# Patient Record
Sex: Male | Born: 1955 | Race: Black or African American | Hispanic: No | Smoking: Current every day smoker
Health system: Southern US, Community
[De-identification: ages and names within clinical notes are randomized; demographics above are authoritative.]

## PROBLEM LIST (undated history)

## (undated) DIAGNOSIS — I639 Cerebral infarction, unspecified: Secondary | ICD-10-CM

## (undated) DIAGNOSIS — N492 Inflammatory disorders of scrotum: Secondary | ICD-10-CM

## (undated) DIAGNOSIS — I10 Essential (primary) hypertension: Secondary | ICD-10-CM

## (undated) DIAGNOSIS — Q279 Congenital malformation of peripheral vascular system, unspecified: Secondary | ICD-10-CM

## (undated) DIAGNOSIS — F1010 Alcohol abuse, uncomplicated: Secondary | ICD-10-CM

## (undated) DIAGNOSIS — M1990 Unspecified osteoarthritis, unspecified site: Secondary | ICD-10-CM

## (undated) HISTORY — DX: Congenital malformation of peripheral vascular system, unspecified: Q27.9

## (undated) HISTORY — DX: Inflammatory disorders of scrotum: N49.2

---

## 1990-01-08 HISTORY — PX: NECK SURGERY: SHX720

## 2007-01-08 ENCOUNTER — Emergency Department (HOSPITAL_COMMUNITY): Admission: EM | Admit: 2007-01-08 | Discharge: 2007-01-08 | Payer: Self-pay | Admitting: Family Medicine

## 2008-09-05 ENCOUNTER — Emergency Department (HOSPITAL_COMMUNITY): Admission: EM | Admit: 2008-09-05 | Discharge: 2008-09-05 | Payer: Self-pay | Admitting: Emergency Medicine

## 2010-04-15 LAB — DIFFERENTIAL
Eosinophils Absolute: 0.1 10*3/uL (ref 0.0–0.7)
Lymphocytes Relative: 33 % (ref 12–46)
Lymphs Abs: 1.9 10*3/uL (ref 0.7–4.0)
Neutro Abs: 3.2 10*3/uL (ref 1.7–7.7)
Neutrophils Relative %: 57 % (ref 43–77)

## 2010-04-15 LAB — COMPREHENSIVE METABOLIC PANEL
ALT: 19 U/L (ref 0–53)
BUN: 9 mg/dL (ref 6–23)
CO2: 25 mEq/L (ref 19–32)
Calcium: 9 mg/dL (ref 8.4–10.5)
Creatinine, Ser: 0.94 mg/dL (ref 0.4–1.5)
GFR calc non Af Amer: >60 mL/min (ref >60)
Glucose, Bld: 138 mg/dL — ABNORMAL HIGH (ref 70–99)

## 2010-04-15 LAB — LIPASE, BLOOD: Lipase: 105 U/L — ABNORMAL HIGH (ref 11–59)

## 2010-04-15 LAB — CBC
HCT: 47.7 % (ref 39.0–52.0)
Hemoglobin: 16.4 g/dL (ref 13.0–17.0)
MCHC: 34.3 g/dL (ref 30.0–36.0)
MCV: 97.8 fL (ref 78.0–100.0)
RBC: 4.88 MIL/uL (ref 4.22–5.81)

## 2011-04-26 ENCOUNTER — Encounter (HOSPITAL_COMMUNITY): Payer: Self-pay | Admitting: Emergency Medicine

## 2011-04-26 ENCOUNTER — Emergency Department (INDEPENDENT_AMBULATORY_CARE_PROVIDER_SITE_OTHER)
Admission: EM | Admit: 2011-04-26 | Discharge: 2011-04-26 | Disposition: A | Discharge status: Home / Self Care | Payer: 59 | Source: Home / Self Care | Attending: Emergency Medicine | Admitting: Emergency Medicine

## 2011-04-26 DIAGNOSIS — I10 Essential (primary) hypertension: Secondary | ICD-10-CM

## 2011-04-26 DIAGNOSIS — J329 Chronic sinusitis, unspecified: Secondary | ICD-10-CM

## 2011-04-26 HISTORY — DX: Essential (primary) hypertension: I10

## 2011-04-26 MED ORDER — PREDNISONE 20 MG PO TABS: 40.0000 mg | ORAL_TABLET | Freq: Every day | ORAL | Status: AC

## 2011-04-26 MED ORDER — HYDROCHLOROTHIAZIDE 25 MG PO TABS: 25.0000 mg | ORAL_TABLET | Freq: Every day | ORAL | Status: DC

## 2011-04-26 MED ORDER — FEXOFENADINE HCL 60 MG PO TABS: 60.0000 mg | ORAL_TABLET | Freq: Every day | ORAL | Status: DC

## 2011-04-26 NOTE — ED Provider Notes (Signed)
History     CSN: [id number]  Arrival date & time 04/26/11  0865   First MD Initiated Contact with Patient 04/26/11 1000      Chief Complaint  Patient presents with  . Sinusitis  . URI    (Consider location/radiation/quality/duration/timing/severity/associated sxs/prior treatment) HPI Comments: Patient presents with 5 days of nasal congestion, runny nose, irritated itchy eyes. Has pressure in nasal area. He woke up with these symptoms acutely after doing yard work and mowing grass the night prior to onset. Symptoms are stable, causing trouble sleeping. Has tried benadryl and mucinex without much improvement. He denies fever, facial pain, productive cough, dyspnea, wheezing, chest pain.   He does smoke <0.5ppd.  He has known HTN diagnosed in ED and found on health screen again recently. He has taken medication for this previously. He has an appointment to establish with new PCP at [facility] on 05/07/11 to address this issue.   In addition given hypertension have discussed in as her symptoms such as, headaches, visual changes, numbness and tingling or sudden weakness in any extremity along with facial changes or speech problems patient denied any other symptoms currently or in the past appear  Patient is a 56 y.o. male presenting with sinusitis and URI. The history is provided by the patient.  Sinusitis  Pertinent negatives include no chills, no ear pain, no cough and no shortness of breath.  URI Primary symptoms do not include fever, headaches, ear pain or cough.  The illness is not associated with chills.    Past Medical History  Diagnosis Date  . Hypertension     Past Surgical History  Procedure Date  . Neck surgery     History reviewed. No pertinent family history.  History  Substance Use Topics  . Smoking status: Current Everyday Smoker  . Smokeless tobacco: Not on file  . Alcohol Use: Yes      Review of Systems  Constitutional: Negative for fever and chills.    HENT: Negative for ear pain and neck pain.   Eyes: Negative for photophobia and pain.  Respiratory: Negative for cough and shortness of breath.   Cardiovascular: Negative for chest pain, palpitations and leg swelling.  Neurological: Negative for dizziness, syncope, speech difficulty, light-headedness and headaches.  Psychiatric/Behavioral: Negative for confusion.    Allergies  Penicillins  Home Medications   Current Outpatient Rx  Name Route Sig Dispense Refill  . HYDROCHLOROTHIAZIDE 12.5 MG PO CAPS Oral Take 12.5 mg by mouth daily. HASN'T TAKEN FOR MNTHS    . FEXOFENADINE HCL 60 MG PO TABS Oral Take 1 tablet (60 mg total) by mouth daily. 15 tablet 0  . HYDROCHLOROTHIAZIDE 25 MG PO TABS Oral Take 1 tablet (25 mg total) by mouth daily. 30 tablet 0  . PREDNISONE 20 MG PO TABS Oral Take 2 tablets (40 mg total) by mouth daily. 2 tablets daily for 5 days 10 tablet 0    BP 184/121  Pulse 110  Temp(Src) 98.2 F (36.8 C) (Oral)  Resp 16  SpO2 99%  Physical Exam  Constitutional: He is oriented to person, place, and time. He appears well-developed and well-nourished.       Speech is nasal with congestion. Some mouth-breathing. No stridor.  HENT:  Head: Normocephalic and atraumatic.       Pharyngeal injection. Nasal mucosa irritated, edematous throughout. No tenderness in frontal or maxillary sinuses.  Eyes: EOM are normal. Pupils are equal, round, and reactive to light.       Sclera injected.  Neck: Normal range of motion. Neck supple.  Cardiovascular: Regular rhythm, normal heart sounds and intact distal pulses.  Tachycardia present.  Exam reveals no gallop and no distant heart sounds.   No murmur heard.      Patient was noted initially on initial vital signs to be tachycardia on exam patient remained anywhere from 95-105. Regular rhythm.  Pulmonary/Chest: Effort normal and breath sounds normal. No respiratory distress. He has no wheezes. He has no rales. He exhibits no tenderness.   Musculoskeletal: He exhibits no edema and no tenderness.  Lymphadenopathy:    He has no cervical adenopathy.  Neurological: He is alert and oriented to person, place, and time. He exhibits normal muscle tone. Coordination normal.  Skin: No rash noted.  Psychiatric: He has a normal mood and affect.    ED Course  Procedures (including critical care time)  Labs Reviewed - No data to display No results found.   1. Sinusitis   2. Hypertension       MDM  Allergic rhino-sinusitis vs early infectious sinusitis. Recommend daily antihistamine and short course steroid. Discussed avoidance of decongestants that would elevate BP. Nasal saline prn.   Hypertension: patient is asymptomatic and likely chronically elevated according to history. He has an appointment with new PCP to establish care in 10 days. Will start low dose HCTZ and patient to f/u with new provider for labs and recheck.         Jimmie Molly, MD 04/26/11 1256

## 2011-04-26 NOTE — Discharge Instructions (Signed)
Today we discuss several things among them complications or associated with untreated high blood pressure. You have a followup appointment scheduled in a primary care doctor please followup and consider writing down your blood pressure for further documentation and more information for your Dr. as to determine whether this medicine will be sufficient or not her father modifications to take place. We also discuss symptoms that should require immediate or emergent attention in the emergency department  Arterial Hypertension Arterial hypertension (high blood pressure) is a condition of elevated pressure in your blood vessels. Hypertension over a long period of time is a risk factor for strokes, heart attacks, and heart failure. It is also the leading cause of kidney (renal) failure.  CAUSES   In Adults -- Over 90% of all hypertension has no known cause. This is called essential or primary hypertension. In the other 10% of people with hypertension, the increase in blood pressure is caused by another disorder. This is called secondary hypertension. Important causes of secondary hypertension are:   Heavy alcohol use.   Obstructive sleep apnea.   Hyperaldosterosim (Conn's syndrome).   Steroid use.   Chronic kidney failure.   Hyperparathyroidism.   Medications.   Renal artery stenosis.   Pheochromocytoma.   Cushing's disease.   Coarctation of the aorta.   Scleroderma renal crisis.   Licorice (in excessive amounts).   Drugs (cocaine, methamphetamine).  Your caregiver can explain any items above that apply to you.  In Children -- Secondary hypertension is more common and should always be considered.   Pregnancy -- Few women of childbearing age have high blood pressure. However, up to 10% of them develop hypertension of pregnancy. Generally, this will not harm the woman. It may be a sign of 3 complications of pregnancy: preeclampsia, HELLP syndrome, and eclampsia. Follow up and  control with medication is necessary.  SYMPTOMS   This condition normally does not produce any noticeable symptoms. It is usually found during a routine exam.   Malignant hypertension is a late problem of high blood pressure. It may have the following symptoms:   Headaches.   Blurred vision.   End-organ damage (this means your kidneys, heart, lungs, and other organs are being damaged).   Stressful situations can increase the blood pressure. If a person with normal blood pressure has their blood pressure go up while being seen by their caregiver, this is often termed "white coat hypertension." Its importance is not known. It may be related with eventually developing hypertension or complications of hypertension.   Hypertension is often confused with mental tension, stress, and anxiety.  DIAGNOSIS  The diagnosis is made by 3 separate blood pressure measurements. They are taken at least 1 week apart from each other. If there is organ damage from hypertension, the diagnosis may be made without repeat measurements. Hypertension is usually identified by having blood pressure readings:  Above 140/90 mmHg measured in both arms, at 3 separate times, over a couple weeks.   Over 130/80 mmHg should be considered a risk factor and may require treatment in patients with diabetes.  Blood pressure readings over 120/80 mmHg are called "pre-hypertension" even in non-diabetic patients. To get a true blood pressure measurement, use the following guidelines. Be aware of the factors that can alter blood pressure readings.  Take measurements at least 1 hour after caffeine.   Take measurements 30 minutes after smoking and without any stress. This is another reason to quit smoking - it raises your blood pressure.  Use a proper cuff size. Ask your caregiver if you are not sure about your cuff size.   Most home blood pressure cuffs are automatic. They will measure systolic and diastolic pressures. The systolic  pressure is the pressure reading at the start of sounds. Diastolic pressure is the pressure at which the sounds disappear. If you are elderly, measure pressures in multiple postures. Try sitting, lying or standing.   Sit at rest for a minimum of 5 minutes before taking measurements.   You should not be on any medications like decongestants. These are found in many cold medications.   Record your blood pressure readings and review them with your caregiver.  If you have hypertension:  Your caregiver may do tests to be sure you do not have secondary hypertension (see "causes" above).   Your caregiver may also look for signs of metabolic syndrome. This is also called Syndrome X or Insulin Resistance Syndrome. You may have this syndrome if you have type 2 diabetes, abdominal obesity, and abnormal blood lipids in addition to hypertension.   Your caregiver will take your medical and family history and perform a physical exam.   Diagnostic tests may include blood tests (for glucose, cholesterol, potassium, and kidney function), a urinalysis, or an EKG. Other tests may also be necessary depending on your condition.  PREVENTION  There are important lifestyle issues that you can adopt to reduce your chance of developing hypertension:  Maintain a normal weight.   Limit the amount of salt (sodium) in your diet.   Exercise often.   Limit alcohol intake.   Get enough potassium in your diet. Discuss specific advice with your caregiver.   Follow a DASH diet (dietary approaches to stop hypertension). This diet is rich in fruits, vegetables, and low-fat dairy products, and avoids certain fats.  PROGNOSIS  Essential hypertension cannot be cured. Lifestyle changes and medical treatment can lower blood pressure and reduce complications. The prognosis of secondary hypertension depends on the underlying cause. Many people whose hypertension is controlled with medicine or lifestyle changes can live a normal,  healthy life.  RISKS AND COMPLICATIONS  While high blood pressure alone is not an illness, it often requires treatment due to its short- and long-term effects on many organs. Hypertension increases your risk for:  CVAs or strokes (cerebrovascular accident).   Heart failure due to chronically high blood pressure (hypertensive cardiomyopathy).   Heart attack (myocardial infarction).   Damage to the retina (hypertensive retinopathy).   Kidney failure (hypertensive nephropathy).  Your caregiver can explain list items above that apply to you. Treatment of hypertension can significantly reduce the risk of complications. TREATMENT   For overweight patients, weight loss and regular exercise are recommended. Physical fitness lowers blood pressure.   Mild hypertension is usually treated with diet and exercise. A diet rich in fruits and vegetables, fat-free dairy products, and foods low in fat and salt (sodium) can help lower blood pressure. Decreasing salt intake decreases blood pressure in a 1/3 of people.   Stop smoking if you are a smoker.  The steps above are highly effective in reducing blood pressure. While these actions are easy to suggest, they are difficult to achieve. Most patients with moderate or severe hypertension end up requiring medications to bring their blood pressure down to a normal level. There are several classes of medications for treatment. Blood pressure pills (antihypertensives) will lower blood pressure by their different actions. Lowering the blood pressure by 10 mmHg may decrease the risk of  complications by as much as 25%. The goal of treatment is effective blood pressure control. This will reduce your risk for complications. Your caregiver will help you determine the best treatment for you according to your lifestyle. What is excellent treatment for one person, may not be for you. HOME CARE INSTRUCTIONS   Do not smoke.   Follow the lifestyle changes outlined in the  "Prevention" section.   If you are on medications, follow the directions carefully. Blood pressure medications must be taken as prescribed. Skipping doses reduces their benefit. It also puts you at risk for problems.   Follow up with your caregiver, as directed.   If you are asked to monitor your blood pressure at home, follow the guidelines in the "Diagnosis" section above.  SEEK MEDICAL CARE IF:   You think you are having medication side effects.   You have recurrent headaches or lightheadedness.   You have swelling in your ankles.   You have trouble with your vision.  SEEK IMMEDIATE MEDICAL CARE IF:   You have sudden onset of chest pain or pressure, difficulty breathing, or other symptoms of a heart attack.   You have a severe headache.   You have symptoms of a stroke (such as sudden weakness, difficulty speaking, difficulty walking).  MAKE SURE YOU:   Understand these instructions.   Will watch your condition.   Will get help right away if you are not doing well or get worse.  Document Released: 12/25/2004 Document Revised: 12/14/2010 Document Reviewed: 07/25/2006 [facility] Patient Information 2012 [place], [place].

## 2011-04-26 NOTE — ED Notes (Signed)
PT HERE WITH C/O SINUS SX CONGESTION,SINUS PRESSURE AND POST NASAL DRIP WITH COUGH/BROWNISH PHLEGM THAT STARTED LAST Sunday.SX ARE NOT IMPROVING WITH OTC MUCINEX OR COLD MEDS.AFEBRILE.BP ELEVATED 184/121.PT ADMITS TO NOT TAKING BP MEDS FOR SEVERAL MNTHS.DENIES H/A OR CP

## 2014-04-04 ENCOUNTER — Observation Stay (HOSPITAL_COMMUNITY): Payer: 59 | Admitting: Anesthesiology

## 2014-04-04 ENCOUNTER — Encounter (HOSPITAL_COMMUNITY)
Admission: EM | Disposition: A | Discharge status: Home / Self Care | Payer: Self-pay | Source: Home / Self Care | Attending: Internal Medicine

## 2014-04-04 ENCOUNTER — Encounter (HOSPITAL_COMMUNITY): Payer: Self-pay | Admitting: *Deleted

## 2014-04-04 ENCOUNTER — Emergency Department (HOSPITAL_COMMUNITY)
Admission: EM | Admit: 2014-04-04 | Discharge: 2014-04-04 | Disposition: A | Discharge status: Home / Self Care | Payer: 59 | Attending: Emergency Medicine | Admitting: Emergency Medicine

## 2014-04-04 ENCOUNTER — Encounter (HOSPITAL_COMMUNITY): Payer: Self-pay | Admitting: Family Medicine

## 2014-04-04 ENCOUNTER — Inpatient Hospital Stay (HOSPITAL_COMMUNITY)
Admission: EM | Admit: 2014-04-04 | Discharge: 2014-04-05 | DRG: 151 | Disposition: A | Discharge status: Home / Self Care | Payer: 59 | Attending: Internal Medicine | Admitting: Internal Medicine

## 2014-04-04 DIAGNOSIS — Z79899 Other long term (current) drug therapy: Secondary | ICD-10-CM | POA: Diagnosis not present

## 2014-04-04 DIAGNOSIS — D696 Thrombocytopenia, unspecified: Secondary | ICD-10-CM | POA: Diagnosis present

## 2014-04-04 DIAGNOSIS — I160 Hypertensive urgency: Secondary | ICD-10-CM

## 2014-04-04 DIAGNOSIS — I10 Essential (primary) hypertension: Secondary | ICD-10-CM | POA: Diagnosis present

## 2014-04-04 DIAGNOSIS — F17210 Nicotine dependence, cigarettes, uncomplicated: Secondary | ICD-10-CM | POA: Diagnosis present

## 2014-04-04 DIAGNOSIS — Z880 Allergy status to penicillin: Secondary | ICD-10-CM | POA: Diagnosis not present

## 2014-04-04 DIAGNOSIS — R040 Epistaxis: Secondary | ICD-10-CM | POA: Insufficient documentation

## 2014-04-04 DIAGNOSIS — M79661 Pain in right lower leg: Secondary | ICD-10-CM | POA: Diagnosis present

## 2014-04-04 DIAGNOSIS — Z9119 Patient's noncompliance with other medical treatment and regimen: Secondary | ICD-10-CM | POA: Diagnosis present

## 2014-04-04 DIAGNOSIS — F1099 Alcohol use, unspecified with unspecified alcohol-induced disorder: Secondary | ICD-10-CM | POA: Diagnosis present

## 2014-04-04 DIAGNOSIS — M79662 Pain in left lower leg: Secondary | ICD-10-CM | POA: Diagnosis present

## 2014-04-04 DIAGNOSIS — Z789 Other specified health status: Secondary | ICD-10-CM | POA: Diagnosis present

## 2014-04-04 DIAGNOSIS — R000 Tachycardia, unspecified: Secondary | ICD-10-CM | POA: Diagnosis present

## 2014-04-04 DIAGNOSIS — F1090 Alcohol use, unspecified, uncomplicated: Secondary | ICD-10-CM | POA: Diagnosis present

## 2014-04-04 DIAGNOSIS — J3489 Other specified disorders of nose and nasal sinuses: Secondary | ICD-10-CM | POA: Diagnosis not present

## 2014-04-04 DIAGNOSIS — Z720 Tobacco use: Secondary | ICD-10-CM | POA: Diagnosis not present

## 2014-04-04 DIAGNOSIS — Z7289 Other problems related to lifestyle: Secondary | ICD-10-CM | POA: Diagnosis present

## 2014-04-04 DIAGNOSIS — J302 Other seasonal allergic rhinitis: Secondary | ICD-10-CM | POA: Diagnosis present

## 2014-04-04 HISTORY — PX: NASAL ENDOSCOPY WITH EPISTAXIS CONTROL: SHX5664

## 2014-04-04 LAB — COMPREHENSIVE METABOLIC PANEL
ALK PHOS: 61 U/L (ref 39–117)
ALT: 30 U/L (ref 0–53)
AST: 40 U/L — AB (ref 0–37)
Albumin: 4 g/dL (ref 3.5–5.2)
Anion gap: 11 (ref 5–15)
BILIRUBIN TOTAL: 1.4 mg/dL — AB (ref 0.3–1.2)
BUN: 15 mg/dL (ref 6–23)
CO2: 23 mmol/L (ref 19–32)
CREATININE: 0.94 mg/dL (ref 0.50–1.35)
Calcium: 9.1 mg/dL (ref 8.4–10.5)
Chloride: 103 mmol/L (ref 96–112)
GLUCOSE: 116 mg/dL — AB (ref 70–99)
POTASSIUM: 4.1 mmol/L (ref 3.5–5.1)
Sodium: 137 mmol/L (ref 135–145)
TOTAL PROTEIN: 7.9 g/dL (ref 6.0–8.3)

## 2014-04-04 LAB — I-STAT CHEM 8, ED
BUN: 21 mg/dL (ref 6–23)
CALCIUM ION: 1.1 mmol/L — AB (ref 1.12–1.23)
CREATININE: 0.8 mg/dL (ref 0.50–1.35)
Chloride: 102 mmol/L (ref 96–112)
Glucose, Bld: 117 mg/dL — ABNORMAL HIGH (ref 70–99)
HEMATOCRIT: 46 % (ref 39.0–52.0)
HEMOGLOBIN: 15.6 g/dL (ref 13.0–17.0)
POTASSIUM: 4.5 mmol/L (ref 3.5–5.1)
Sodium: 137 mmol/L (ref 135–145)
TCO2: 24 mmol/L (ref 0–100)

## 2014-04-04 LAB — CBC WITH DIFFERENTIAL/PLATELET
BASOS ABS: 0 10*3/uL (ref 0.0–0.1)
Basophils Relative: 1 % (ref 0–1)
Eosinophils Absolute: 0.1 10*3/uL (ref 0.0–0.7)
Eosinophils Relative: 3 % (ref 0–5)
HCT: 43.8 % (ref 39.0–52.0)
HEMOGLOBIN: 15.1 g/dL (ref 13.0–17.0)
LYMPHS PCT: 38 % (ref 12–46)
Lymphs Abs: 1.6 10*3/uL (ref 0.7–4.0)
MCH: 32.6 pg (ref 26.0–34.0)
MCHC: 34.5 g/dL (ref 30.0–36.0)
MCV: 94.6 fL (ref 78.0–100.0)
MONO ABS: 0.3 10*3/uL (ref 0.1–1.0)
Monocytes Relative: 7 % (ref 3–12)
Neutro Abs: 2.2 10*3/uL (ref 1.7–7.7)
Neutrophils Relative %: 51 % (ref 43–77)
PLATELETS: 132 10*3/uL — AB (ref 150–400)
RBC: 4.63 MIL/uL (ref 4.22–5.81)
RDW: 13.3 % (ref 11.5–15.5)
WBC: 4.3 10*3/uL (ref 4.0–10.5)

## 2014-04-04 LAB — CBC
HCT: 42.1 % (ref 39.0–52.0)
Hemoglobin: 14.5 g/dL (ref 13.0–17.0)
MCH: 32.7 pg (ref 26.0–34.0)
MCHC: 34.4 g/dL (ref 30.0–36.0)
MCV: 94.8 fL (ref 78.0–100.0)
Platelets: 134 10*3/uL — ABNORMAL LOW (ref 150–400)
RBC: 4.44 MIL/uL (ref 4.22–5.81)
RDW: 13.3 % (ref 11.5–15.5)
WBC: 7.7 10*3/uL (ref 4.0–10.5)

## 2014-04-04 LAB — APTT: APTT: 27 s (ref 24–37)

## 2014-04-04 LAB — PROTIME-INR
INR: 1.04 (ref 0.00–1.49)
PROTHROMBIN TIME: 13.8 s (ref 11.6–15.2)

## 2014-04-04 SURGERY — CONTROL OF EPISTAXIS, ENDOSCOPIC
Anesthesia: General | Site: Nose

## 2014-04-04 MED ORDER — MEPERIDINE HCL 25 MG/ML IJ SOLN: 6.2500 mg | INTRAMUSCULAR | Status: DC | PRN

## 2014-04-04 MED ORDER — BACITRACIN ZINC 500 UNIT/GM EX OINT
TOPICAL_OINTMENT | CUTANEOUS | Status: AC
  Filled 2014-04-04: qty 28.35

## 2014-04-04 MED ORDER — SODIUM CHLORIDE 0.9 % IV BOLUS (SEPSIS)
1000.0000 mL | Freq: Once | INTRAVENOUS | Status: AC
  Administered 2014-04-04: 1000 mL via INTRAVENOUS

## 2014-04-04 MED ORDER — PROPOFOL 10 MG/ML IV BOLUS
INTRAVENOUS | Status: AC
  Filled 2014-04-04: qty 20

## 2014-04-04 MED ORDER — PHENYLEPHRINE HCL 10 MG/ML IJ SOLN
INTRAMUSCULAR | Status: DC | PRN
  Administered 2014-04-04: 80 ug via INTRAVENOUS

## 2014-04-04 MED ORDER — ONDANSETRON HCL 4 MG/2ML IJ SOLN
4.0000 mg | Freq: Four times a day (QID) | INTRAMUSCULAR | Status: DC | PRN
  Administered 2014-04-04 – 2014-04-05 (×2): 4 mg via INTRAVENOUS
  Filled 2014-04-04: qty 2

## 2014-04-04 MED ORDER — METOPROLOL TARTRATE 1 MG/ML IV SOLN
5.0000 mg | Freq: Once | INTRAVENOUS | Status: AC
  Administered 2014-04-04: 5 mg via INTRAVENOUS
  Filled 2014-04-04: qty 5

## 2014-04-04 MED ORDER — LORAZEPAM 1 MG PO TABS: 1.0000 mg | ORAL_TABLET | Freq: Four times a day (QID) | ORAL | Status: DC | PRN

## 2014-04-04 MED ORDER — AMINOCAPROIC ACID SOLUTION 5% (50 MG/ML)
10.0000 mL | ORAL | Status: DC
  Administered 2014-04-04 – 2014-04-05 (×6): 10 mL via ORAL
  Filled 2014-04-04: qty 100

## 2014-04-04 MED ORDER — ONDANSETRON HCL 4 MG/2ML IJ SOLN
INTRAMUSCULAR | Status: AC
  Filled 2014-04-04: qty 2

## 2014-04-04 MED ORDER — SUCCINYLCHOLINE CHLORIDE 20 MG/ML IJ SOLN
INTRAMUSCULAR | Status: AC
  Filled 2014-04-04: qty 1

## 2014-04-04 MED ORDER — ONDANSETRON HCL 4 MG PO TABS: 4.0000 mg | ORAL_TABLET | Freq: Four times a day (QID) | ORAL | Status: DC | PRN

## 2014-04-04 MED ORDER — LIDOCAINE-EPINEPHRINE 1 %-1:100000 IJ SOLN
INTRAMUSCULAR | Status: AC
  Filled 2014-04-04: qty 1

## 2014-04-04 MED ORDER — ALUM & MAG HYDROXIDE-SIMETH 200-200-20 MG/5ML PO SUSP: 30.0000 mL | Freq: Four times a day (QID) | ORAL | Status: DC | PRN

## 2014-04-04 MED ORDER — CLINDAMYCIN PHOSPHATE 600 MG/50ML IV SOLN
600.0000 mg | Freq: Once | INTRAVENOUS | Status: AC
  Administered 2014-04-04: 600 mg via INTRAVENOUS
  Filled 2014-04-04: qty 50

## 2014-04-04 MED ORDER — MORPHINE SULFATE 2 MG/ML IJ SOLN
2.0000 mg | INTRAMUSCULAR | Status: DC | PRN
  Administered 2014-04-05 (×2): 2 mg via INTRAVENOUS
  Filled 2014-04-04 (×2): qty 1

## 2014-04-04 MED ORDER — ONDANSETRON HCL 4 MG/2ML IJ SOLN
4.0000 mg | Freq: Once | INTRAMUSCULAR | Status: DC | PRN
  Filled 2014-04-04: qty 2

## 2014-04-04 MED ORDER — LIDOCAINE HCL (CARDIAC) 20 MG/ML IV SOLN
INTRAVENOUS | Status: DC | PRN
  Administered 2014-04-04: 100 mg via INTRAVENOUS

## 2014-04-04 MED ORDER — PROPOFOL 10 MG/ML IV BOLUS
INTRAVENOUS | Status: DC | PRN
  Administered 2014-04-04: 180 mg via INTRAVENOUS

## 2014-04-04 MED ORDER — ROCURONIUM BROMIDE 50 MG/5ML IV SOLN
INTRAVENOUS | Status: AC
  Filled 2014-04-04: qty 1

## 2014-04-04 MED ORDER — HYDRALAZINE HCL 20 MG/ML IJ SOLN
10.0000 mg | Freq: Four times a day (QID) | INTRAMUSCULAR | Status: DC | PRN
  Administered 2014-04-05: 10 mg via INTRAVENOUS
  Filled 2014-04-04: qty 1

## 2014-04-04 MED ORDER — FENTANYL CITRATE 0.05 MG/ML IJ SOLN
INTRAMUSCULAR | Status: AC
  Filled 2014-04-04: qty 5

## 2014-04-04 MED ORDER — ACETAMINOPHEN 325 MG PO TABS: 650.0000 mg | ORAL_TABLET | Freq: Four times a day (QID) | ORAL | Status: DC | PRN

## 2014-04-04 MED ORDER — SODIUM CHLORIDE 0.9 % IJ SOLN
INTRAMUSCULAR | Status: AC
  Filled 2014-04-04: qty 10

## 2014-04-04 MED ORDER — HYDROMORPHONE HCL 1 MG/ML IJ SOLN: 0.2500 mg | INTRAMUSCULAR | Status: DC | PRN

## 2014-04-04 MED ORDER — FOLIC ACID 1 MG PO TABS
1.0000 mg | ORAL_TABLET | Freq: Every day | ORAL | Status: DC
  Administered 2014-04-04 – 2014-04-05 (×2): 1 mg via ORAL
  Filled 2014-04-04 (×2): qty 1

## 2014-04-04 MED ORDER — MORPHINE SULFATE 2 MG/ML IJ SOLN
2.0000 mg | Freq: Once | INTRAMUSCULAR | Status: AC
  Administered 2014-04-04: 2 mg via INTRAVENOUS
  Filled 2014-04-04: qty 1

## 2014-04-04 MED ORDER — VITAMIN B-1 100 MG PO TABS
100.0000 mg | ORAL_TABLET | Freq: Every day | ORAL | Status: DC
  Administered 2014-04-04 – 2014-04-05 (×2): 100 mg via ORAL
  Filled 2014-04-04 (×2): qty 1

## 2014-04-04 MED ORDER — SUCCINYLCHOLINE CHLORIDE 20 MG/ML IJ SOLN
INTRAMUSCULAR | Status: DC | PRN
  Administered 2014-04-04: 160 mg via INTRAVENOUS

## 2014-04-04 MED ORDER — PHENYLEPHRINE 40 MCG/ML (10ML) SYRINGE FOR IV PUSH (FOR BLOOD PRESSURE SUPPORT)
PREFILLED_SYRINGE | INTRAVENOUS | Status: AC
  Filled 2014-04-04: qty 10

## 2014-04-04 MED ORDER — MORPHINE SULFATE 4 MG/ML IJ SOLN
4.0000 mg | Freq: Once | INTRAMUSCULAR | Status: AC
  Administered 2014-04-04: 4 mg via INTRAVENOUS
  Filled 2014-04-04: qty 1

## 2014-04-04 MED ORDER — SODIUM CHLORIDE 0.9 % IV SOLN
INTRAVENOUS | Status: DC
Start: 2014-04-04 — End: 2014-04-05
  Administered 2014-04-04 – 2014-04-05 (×2): via INTRAVENOUS

## 2014-04-04 MED ORDER — LIDOCAINE HCL (CARDIAC) 20 MG/ML IV SOLN
INTRAVENOUS | Status: AC
  Filled 2014-04-04: qty 5

## 2014-04-04 MED ORDER — LIDOCAINE-EPINEPHRINE 1 %-1:100000 IJ SOLN
INTRAMUSCULAR | Status: DC | PRN
  Administered 2014-04-04: 14 mL

## 2014-04-04 MED ORDER — LABETALOL HCL 5 MG/ML IV SOLN
10.0000 mg | Freq: Once | INTRAVENOUS | Status: AC
  Administered 2014-04-04: 10 mg via INTRAVENOUS
  Filled 2014-04-04: qty 4

## 2014-04-04 MED ORDER — SODIUM CHLORIDE 0.9 % IJ SOLN
3.0000 mL | Freq: Two times a day (BID) | INTRAMUSCULAR | Status: DC
  Administered 2014-04-04 – 2014-04-05 (×2): 3 mL via INTRAVENOUS

## 2014-04-04 MED ORDER — FENTANYL CITRATE 0.05 MG/ML IJ SOLN
INTRAMUSCULAR | Status: DC | PRN
  Administered 2014-04-04 (×2): 100 ug via INTRAVENOUS
  Administered 2014-04-04: 50 ug via INTRAVENOUS

## 2014-04-04 MED ORDER — BACITRACIN ZINC 500 UNIT/GM EX OINT
TOPICAL_OINTMENT | CUTANEOUS | Status: DC | PRN
  Administered 2014-04-04: 1 via TOPICAL

## 2014-04-04 MED ORDER — ONDANSETRON HCL 4 MG/2ML IJ SOLN
4.0000 mg | Freq: Once | INTRAMUSCULAR | Status: AC
  Administered 2014-04-04: 4 mg via INTRAVENOUS
  Filled 2014-04-04: qty 2

## 2014-04-04 MED ORDER — ONDANSETRON HCL 4 MG/2ML IJ SOLN
INTRAMUSCULAR | Status: DC | PRN
  Administered 2014-04-04: 4 mg via INTRAVENOUS

## 2014-04-04 MED ORDER — OXYMETAZOLINE HCL 0.05 % NA SOLN
NASAL | Status: AC
  Filled 2014-04-04: qty 15

## 2014-04-04 MED ORDER — THIAMINE HCL 100 MG/ML IJ SOLN
100.0000 mg | Freq: Every day | INTRAMUSCULAR | Status: DC
  Filled 2014-04-04 (×2): qty 1

## 2014-04-04 MED ORDER — LORAZEPAM 2 MG/ML IJ SOLN: 1.0000 mg | Freq: Four times a day (QID) | INTRAMUSCULAR | Status: DC | PRN

## 2014-04-04 MED ORDER — MIDAZOLAM HCL 5 MG/5ML IJ SOLN
INTRAMUSCULAR | Status: DC | PRN
  Administered 2014-04-04: 2 mg via INTRAVENOUS

## 2014-04-04 MED ORDER — EPHEDRINE SULFATE 50 MG/ML IJ SOLN
INTRAMUSCULAR | Status: AC
  Filled 2014-04-04: qty 1

## 2014-04-04 MED ORDER — MORPHINE SULFATE 2 MG/ML IJ SOLN: 2.0000 mg | Freq: Once | INTRAMUSCULAR | Status: DC

## 2014-04-04 MED ORDER — MIDAZOLAM HCL 2 MG/2ML IJ SOLN
INTRAMUSCULAR | Status: AC
  Filled 2014-04-04: qty 2

## 2014-04-04 MED ORDER — PHENYLEPHRINE HCL 0.5 % NA SOLN
NASAL | Status: DC | PRN
  Administered 2014-04-04: 2 [drp] via NASAL

## 2014-04-04 MED ORDER — ACETAMINOPHEN 650 MG RE SUPP: 650.0000 mg | Freq: Four times a day (QID) | RECTAL | Status: DC | PRN

## 2014-04-04 MED ORDER — ADULT MULTIVITAMIN W/MINERALS CH
1.0000 | ORAL_TABLET | Freq: Every day | ORAL | Status: DC
  Administered 2014-04-04 – 2014-04-05 (×2): 1 via ORAL
  Filled 2014-04-04 (×2): qty 1

## 2014-04-04 MED ORDER — LACTATED RINGERS IV SOLN
INTRAVENOUS | Status: DC | PRN
  Administered 2014-04-04 (×2): via INTRAVENOUS

## 2014-04-04 SURGICAL SUPPLY — 45 items
ATTRACTOMAT 16X20 MAGNETIC DRP (DRAPES) IMPLANT
BLADE RAD40 ROTATE 4M 4 5PK (BLADE) IMPLANT
BLADE RAD60 ROTATE M4 4 5PK (BLADE) IMPLANT
BLADE TRICUT ROTATE M4 4 5PK (BLADE) ×2 IMPLANT
CANISTER SUCTION 2500CC (MISCELLANEOUS) ×2 IMPLANT
COAGULATOR SUCT SWTCH 10FR 6 (ELECTROSURGICAL) IMPLANT
CORDS BIPOLAR (ELECTRODE) IMPLANT
CRADLE DONUT ADULT HEAD (MISCELLANEOUS) IMPLANT
DECANTER SPIKE VIAL GLASS SM (MISCELLANEOUS) ×2 IMPLANT
DRESSING NASAL POPE 10X1.5X2.5 (GAUZE/BANDAGES/DRESSINGS) IMPLANT
DRESSING TELFA 8X10 (GAUZE/BANDAGES/DRESSINGS) IMPLANT
DRSG NASAL POPE 10X1.5X2.5 (GAUZE/BANDAGES/DRESSINGS)
DRSG NASOPORE 8CM (GAUZE/BANDAGES/DRESSINGS) ×2 IMPLANT
ELECT REM PT RETURN 9FT ADLT (ELECTROSURGICAL)
ELECTRODE REM PT RTRN 9FT ADLT (ELECTROSURGICAL) IMPLANT
FILTER ARTHROSCOPY CONVERTOR (FILTER) ×2 IMPLANT
FLOSEAL 10ML (HEMOSTASIS) IMPLANT
GLOVE SURG SS PI 7.5 STRL IVOR (GLOVE) ×2 IMPLANT
GOWN STRL REUS W/ TWL LRG LVL3 (GOWN DISPOSABLE) ×2 IMPLANT
GOWN STRL REUS W/TWL LRG LVL3 (GOWN DISPOSABLE) ×2
KIT BASIN OR (CUSTOM PROCEDURE TRAY) ×2 IMPLANT
KIT ROOM TURNOVER OR (KITS) ×2 IMPLANT
NEEDLE HYPO 25GX1X1/2 BEV (NEEDLE) IMPLANT
NEEDLE SPNL 25GX3.5 QUINCKE BL (NEEDLE) IMPLANT
NS IRRIG 1000ML POUR BTL (IV SOLUTION) ×2 IMPLANT
PAD ARMBOARD 7.5X6 YLW CONV (MISCELLANEOUS) ×4 IMPLANT
PATTIES SURGICAL .5 X3 (DISPOSABLE) ×2 IMPLANT
PENCIL FOOT CONTROL (ELECTRODE) ×2 IMPLANT
SHEATH ENDOSCRUB 0 DEG (SHEATH) IMPLANT
SHEATH ENDOSCRUB 30 DEG (SHEATH) IMPLANT
SHEATH ENDOSCRUB 45 DEG (SHEATH) IMPLANT
SOLUTION ANTI FOG 6CC (MISCELLANEOUS) ×2 IMPLANT
SPECIMEN JAR SMALL (MISCELLANEOUS) ×4 IMPLANT
STRIP CLOSURE SKIN 1/2X4 (GAUZE/BANDAGES/DRESSINGS) ×2 IMPLANT
SURGIFLO W/THROMBIN 8M KIT (HEMOSTASIS) ×2 IMPLANT
SUT ETHILON 3 0 PS 1 (SUTURE) IMPLANT
SWAB COLLECTION DEVICE MRSA (MISCELLANEOUS) IMPLANT
SYR 50ML SLIP (SYRINGE) IMPLANT
SYRINGE 10CC LL (SYRINGE) ×2 IMPLANT
TOWEL OR 17X24 6PK STRL BLUE (TOWEL DISPOSABLE) ×2 IMPLANT
TOWEL OR 17X26 10 PK STRL BLUE (TOWEL DISPOSABLE) ×2 IMPLANT
TRAY ENT MC OR (CUSTOM PROCEDURE TRAY) ×2 IMPLANT
TUBE CONNECTING 12X1/4 (SUCTIONS) ×2 IMPLANT
WATER STERILE IRR 1000ML POUR (IV SOLUTION) ×2 IMPLANT
WIPE INSTRUMENT VISIWIPE 73X73 (MISCELLANEOUS) ×2 IMPLANT

## 2014-04-04 NOTE — H&P (Signed)
[facility] History and Physical                                                                                    Larry Houston, is a 59 y.o. male  MRN: [id number]   DOB - [birth year]  Admit Date - 04/04/2014  Outpatient Primary MD for the patient is Lupe Carney, MD  With History of -  Past Medical History  Diagnosis Date  . Hypertension       Past Surgical History  Procedure Laterality Date  . Neck surgery      in for   Chief Complaint  Patient presents with  . Epistaxis     HPI  Larry Houston  is a 59 y.o. 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 He has a past medical history of hypertension, alcohol and tobacco use. He reports having epistaxis starting yesterday. He had 3 bleeds yesterday into more overnight. At approximately 3:30 AM's morning he decided to come to the ER as his bleeding would not stop. He has noticed his stools getting black. In the ER he proceeded to have several more bleeds but it stabilized and he went home. 2 hours after arriving home the bleeding restarted and he came back to the ER. He has been seen by ENT and Dr. Emeline Darling plans to take him to the operating room this evening. In addition to his epistaxis the patient complains of seasonal allergy irritation. He mentions that he takes Advil and Aleve every day and has for the past 6 months due to lower extremity pain. He also uses Tylenol PM each night to sleep. He reports that he consumes approximately 6-10 ounces of whiskey per week and smokes approximately one pack of cigarettes per week. When asked about family history he mentions that his mother died of aplastic anemia.  In the ER he is found to have low platelets (132) and elevated blood pressure (172/113). A Rhino Rocket was placed by the ER physician. ENT is consulted.  Review of Systems   In addition to the HPI above,  No Fever-chills, He complains of a significant headache No problems swallowing food or Liquids, No Chest pain,  Cough or Shortness of Breath, No Abdominal pain, No Nausea or Vomiting, Bowel movements are regular, No Blood in stool or Urine, No dysuria, No new skin rashes or bruises, No new joints pains-aches,  No new weakness, tingling, numbness in any extremity, No recent weight gain or loss, A full 10 point Review of Systems was done, except as stated above, all other Review of Systems were negative.  Social History History  Substance Use Topics  . Smoking status: Current Every Day Smoker  . Smokeless tobacco: Not on file  . Alcohol Use: Yes   smokes one pack of cigarettes per week and has for the past 40 years. Drinks 6-10 ounces of whiskey per week. Lives at home with his partner and is independent with his ADLs. He works here at [employer].   Family History His mother died of aplastic anemia. His father had black lung-he was a Forensic scientist.  Prior to Admission medications   Medication Sig Start Date  End Date Taking? Authorizing Provider  acetaminophen (TYLENOL) 500 MG tablet Take 500 mg by mouth every 6 (six) hours as needed for mild pain.   Yes Historical Provider, MD  acetaminophen (TYLENOL) 650 MG CR tablet Take 650 mg by mouth every 8 (eight) hours as needed for pain (arthritis pain).   Yes Historical Provider, MD  diphenhydramine-acetaminophen (TYLENOL PM) 25-500 MG TABS Take 1 tablet by mouth at bedtime as needed (sleep).   Yes Historical Provider, MD  hydrochlorothiazide (MICROZIDE) 12.5 MG capsule Take 12.5 mg by mouth daily. HASN'T TAKEN FOR MNTHS   Yes Historical Provider, MD  naproxen sodium (ANAPROX) 220 MG tablet Take 220 mg by mouth 2 (two) times daily as needed (pain when goes to work).   Yes Historical Provider, MD  fexofenadine (ALLEGRA) 60 MG tablet Take 1 tablet (60 mg total) by mouth daily. 04/26/11 04/25/12  Jimmie MollyPaolo Coll, MD    Allergies  Allergen Reactions  . Penicillins     Physical Exam  Vitals  Blood pressure 144/101, pulse 100, temperature 98 F (36.7 C),  temperature source Oral, resp. rate 20, SpO2 92 %.   General:  Well-developed, well-nourished pleasant male sitting on the side of the bed. Diaphoretic. Rhino Rocket in place.  Psych:  Normal affect and insight, Not Suicidal or Homicidal, Awake Alert, Oriented X 3.  Neuro:   No F.N deficits, ALL C.Nerves Intact, Strength 5/5 all 4 extremities, Sensation intact all 4 extremities.  ENT:  Ears and Eyes appear Normal, Conjunctivae clear, PER. Bright red blood noted in oropharynx.  Neck:  Supple, No lymphadenopathy appreciated  Respiratory:  Symmetrical chest wall movement, Good air movement bilaterally, CTAB.  Cardiac:  Mildly tachycardic and, No Murmurs, no LE edema noted, no JVD.  Good cap refill.  Abdomen:  Positive bowel sounds, Soft, Non tender, Non distended,  No masses appreciated  Skin:  No Cyanosis, Normal Skin Turgor, No Skin Rash or Bruise.  Skin is dry on his back and legs.   Extremities:  Able to move all 4. 5/5 strength in each,  no effusions.  Data Review  CBC  Recent Labs Lab 04/04/14 0735 04/04/14 1345 04/04/14 1352  WBC 4.3 7.7  --   HGB 15.1 14.5 15.6  HCT 43.8 42.1 46.0  PLT 132* 134*  --   MCV 94.6 94.8  --   MCH 32.6 32.7  --   MCHC 34.5 34.4  --   RDW 13.3 13.3  --   LYMPHSABS 1.6  --   --   MONOABS 0.3  --   --   EOSABS 0.1  --   --   BASOSABS 0.0  --   --     Chemistries   Recent Labs Lab 04/04/14 1345 04/04/14 1352  NA 137 137  K 4.1 4.5  CL 103 102  CO2 23  --   GLUCOSE 116* 117*  BUN 15 21  CREATININE 0.94 0.80  CALCIUM 9.1  --   AST 40*  --   ALT 30  --   ALKPHOS 61  --   BILITOT 1.4*  --     Coagulation profile  Recent Labs Lab 04/04/14 1345  INR 1.04     Imaging results:   My personal review of AVW:[id number]:pending   Assessment & Plan  Principal Problem:   Epistaxis Active Problems:   Hypertensive urgency   Hypertension   Lower extremity pain, bilateral   Alcohol use   Thrombocytopenia   59 year old male,  [employer] nurse tech,  with a history of hypertension, alcohol use, and thrombocytopenia. Presents with recurrent epistaxis. Dr. Emeline Darling consulted. Patient going to the OR on 3/27.  Epistaxis ENT consulted. Going for definitive management to the OR today. Will not order pharmacologic DVT prophylaxis. Hemoglobin stable. Coags are within normal limits. I have advised the patient to stop taking Aleve and Advil.  May have rebound headaches after stopping these as well. He may need Tylenol or tramadol for his lower extremity pain.  Thrombocytopenia (132) This appears to be a chronic issue. His platelets were 104 years ago. He has a history of alcohol use but does not have elevated LFTs or coags. I have advised him to stop drinking. Given that his mother died with aplastic anemia- I have asked him to follow-up with his PCP on this issue and pending Dr. Diamantina Providence best judgment possibly be referred to hematology.  Hypertensive urgency Patient has been noncompliant with his present blood pressure medication at home.  I wonder if he doesn't like the frequent urination it may cause. He is currently nothing by mouth, so we'll place him on hydralazine IV when necessary. Would probably consider a calcium channel blocker at discharge. This should be affordable as the patient can use the [facility] outpatient pharmacy.   alcohol use  patient reports that he drinks approximately 3 whiskey drinks on Saturday and again on Sunday and sometimes on Wednesday.  Will place on CIWA protocol. Have asked the patient to stop drinking due to possible alcoholic suppression of his platelets.   tobacco use Patient states he smokes a pack a week and has for 40 years. Recommended cessation.  DVT Prophylaxis:  SCDs.  AM Labs Ordered, also please review Full Orders  Family Communication:   Patient is alert and orientated.  Code Status:   full   Condition:  Guarded   Time spent in minutes : [address]  PA-C on 04/04/2014 at  5:17 PM  Between 7am to 7pm - Pager - [phone]  After 7pm go to [email or web address] - password [email or web address]  And look for the night coverage person covering me after hours  [facility]

## 2014-04-04 NOTE — Anesthesia Postprocedure Evaluation (Signed)
Anesthesia Post Note  Patient: Larry Houston  Procedure(s) Performed: Procedure(s) (LRB): NASAL ENDOSCOPY WITH EPISTAXIS CONTROL (N/A)  Anesthesia type: general  Patient location: PACU  Post pain: Pain level controlled  Post assessment: Patient's Cardiovascular Status Stable  Last Vitals:  Filed Vitals:   04/04/14 2000  BP: 153/86  Pulse: 97  Temp: 36.8 C  Resp: 25    Post vital signs: Reviewed and stable  Level of consciousness: sedated  Complications: No apparent anesthesia complications

## 2014-04-04 NOTE — Progress Notes (Signed)
Spoke with Consulting civil engineerCharge RN in OR who stated they will pick up patient from ED for OR Schedule time of 1725, and not to transfer patient to floor. Jeanice LimHolly, RN for patient in ED made aware. Plan still for patient to come to [id number] following OR.

## 2014-04-04 NOTE — ED Notes (Signed)
Nose bleed intermittently since yesterday.  No bleeding at present

## 2014-04-04 NOTE — ED Provider Notes (Signed)
CSN: [id number]     Arrival date & time 04/04/14  1253 History   First MD Initiated Contact with Patient 04/04/14 1312     Chief Complaint  Patient presents with  . Epistaxis     (Consider location/radiation/quality/duration/timing/severity/associated sxs/prior Treatment) HPI Comments: Larry Houston is a 59 y.o. male with a PMHx of HTN, who presents to the ED with complaints of recurrent nosebleeds. He was seen earlier today and his nosebleed had ceased, he was given metoprolol for his blood pressure and sent home with instructions to follow-up with ENT. He states that since leaving the hospital, he has had 2 more episodes of epistaxis mostly from the right Darene Lamer but both nares have bleeding at this time, he states pressure being applied to the nasal bridge helps relieve the epistaxis temporarily, but it returns without any eliciting factor. He denies any nose picking or nose blowing. He states that recently he had some mild sinus congestion due to environmental allergies, with some sneezing, which he believes may have caused his nosebleeds have been. He has had nosebleeds in the past but they usually stop quicker and are less amount of blood then what is currently occurring. He reports that since leaving the hospital, he has become more lightheaded. He denies any syncope, headache, ear pain or drainage, vision changes, chest pain, shortness of breath, fevers, chills, abdominal pain, nausea, vomiting, diarrhea, constipation, easy bruising, melena, hematochezia, dysuria, hematuria, numbness, tingling, weakness, anticoagulant use, history of hemophilia or blood clotting disorder, or prolonged bleeding after surgery. He has been an alcoholic since he was 58 years old, stating that he binge drinks on the weekends, and drinks approximately 1-2 beverages of alcohol per week during the weekdays.  Patient is a 59 y.o. male presenting with nosebleeds. The history is provided by the patient. No language  interpreter was used.  Epistaxis Location:  Bilateral (mostly from R nare, but both nares bleeding now) Severity:  Mild Duration:  1 day Timing:  Intermittent Progression:  Worsening Chronicity:  Recurrent Context: thrombocytopenia   Context comment:  Alcoholic Relieved by:  Applying pressure Worsened by:  Nothing tried Ineffective treatments:  None tried Associated symptoms: blood in oropharynx   Associated symptoms: no cough, no facial pain, no fever, no headaches, no sinus pain, no sneezing, no sore throat and no syncope   Risk factors: alcohol use and frequent nosebleeds     Past Medical History  Diagnosis Date  . Hypertension    Past Surgical History  Procedure Laterality Date  . Neck surgery     History reviewed. No pertinent family history. History  Substance Use Topics  . Smoking status: Current Every Day Smoker  . Smokeless tobacco: Not on file  . Alcohol Use: Yes    Review of Systems  Constitutional: Negative for fever and chills.  HENT: Positive for nosebleeds. Negative for ear discharge, ear pain, sneezing, sore throat, tinnitus and trouble swallowing.   Eyes: Negative for visual disturbance.  Respiratory: Negative for cough and shortness of breath.   Cardiovascular: Negative for syncope.  Gastrointestinal: Negative for nausea, vomiting, abdominal pain, diarrhea, constipation and blood in stool.  Genitourinary: Negative for dysuria and hematuria.  Musculoskeletal: Negative for myalgias and arthralgias.  Skin: Negative for color change.  Allergic/Immunologic: Negative for immunocompromised state.  Neurological: Positive for light-headedness. Negative for syncope, weakness, numbness and headaches.  Hematological: Does not bruise/bleed easily.  Psychiatric/Behavioral: Negative for confusion.  10 Systems reviewed and are negative for acute change except as noted in  the HPI.     Allergies  Penicillins  Home Medications   Prior to Admission medications    Medication Sig Start Date End Date Taking? Authorizing Provider  fexofenadine (ALLEGRA) 60 MG tablet Take 1 tablet (60 mg total) by mouth daily. 04/26/11 04/25/12  Jimmie Molly, MD  hydrochlorothiazide (HYDRODIURIL) 25 MG tablet Take 1 tablet (25 mg total) by mouth daily. 04/26/11 04/25/12  Jimmie Molly, MD  hydrochlorothiazide (MICROZIDE) 12.5 MG capsule Take 12.5 mg by mouth daily. HASN'T TAKEN FOR MNTHS    Historical Provider, MD   BP 129/92 mmHg  Pulse 100  Temp(Src) 98 F (36.7 C) (Oral)  Resp 20  SpO2 97% Physical Exam  Constitutional: He is oriented to person, place, and time. Vital signs are normal. He appears well-developed and well-nourished.  Non-toxic appearance. No distress.  Afebrile, nontoxic, NAD, no active epistaxis upon initial evaluation.   HENT:  Head: Normocephalic and atraumatic.  Nose: No nose lacerations, sinus tenderness or nasal septal hematoma. Epistaxis is observed.  Mouth/Throat: Uvula is midline, oropharynx is clear and moist and mucous membranes are normal. No trismus in the jaw. No uvula swelling.  Upon initial assessment, dried blood noted at nares bilaterally, no anterior epistaxis noted, clotted blood noted in posterior nares bilaterally, no obvious active bleeding. Difficult to distinguish which nare is the source, or if it's so posteriorly located that the blood is draining into both nares. Blood noted in posterior oropharynx, draining from behind soft palate. Patent airway without clots present in oropharynx.   Eyes: Conjunctivae and EOM are normal. Pupils are equal, round, and reactive to light. Right eye exhibits no discharge. Left eye exhibits no discharge.  Neck: Normal range of motion. Neck supple.  Cardiovascular: Normal rate, regular rhythm, normal heart sounds and intact distal pulses.  Exam reveals no gallop and no friction rub.   No murmur heard. HR 100  Pulmonary/Chest: Effort normal and breath sounds normal. No respiratory distress. He has no  decreased breath sounds. He has no wheezes. He has no rhonchi. He has no rales.  Abdominal: Soft. Normal appearance and bowel sounds are normal. He exhibits no distension. There is no tenderness. There is no rigidity, no rebound, no guarding, no CVA tenderness, no tenderness at McBurney's point and negative Murphy's sign.  Musculoskeletal: Normal range of motion.  MAE x4 Strength and sensation grossly intact in all extremities Distal pulses intact Steady gait  Neurological: He is alert and oriented to person, place, and time. He has normal strength. No sensory deficit. Gait normal.  No focal neuro deficits  Skin: Skin is warm, dry and intact. No rash noted.  Psychiatric: He has a normal mood and affect.  Nursing note and vitals reviewed.   ED Course  Procedures (including critical care time) Labs Review Labs Reviewed  COMPREHENSIVE METABOLIC PANEL - Abnormal; Notable for the following:    Glucose, Bld 116 (*)    AST 40 (*)    Total Bilirubin 1.4 (*)    All other components within normal limits  CBC - Abnormal; Notable for the following:    Platelets 134 (*)    All other components within normal limits  I-STAT CHEM 8, ED - Abnormal; Notable for the following:    Glucose, Bld 117 (*)    Calcium, Ion 1.10 (*)    All other components within normal limits  APTT  PROTIME-INR   Results for orders placed or performed during the hospital encounter of 04/04/14  CBC with Differential  Result Value  Ref Range   WBC 4.3 4.0 - 10.5 K/uL   RBC 4.63 4.22 - 5.81 MIL/uL   Hemoglobin 15.1 13.0 - 17.0 g/dL   HCT 16.1 09.6 - 04.5 %   MCV 94.6 78.0 - 100.0 fL   MCH 32.6 26.0 - 34.0 pg   MCHC 34.5 30.0 - 36.0 g/dL   RDW 40.9 81.1 - 91.4 %   Platelets 132 (L) 150 - 400 K/uL   Neutrophils Relative % 51 43 - 77 %   Neutro Abs 2.2 1.7 - 7.7 K/uL   Lymphocytes Relative 38 12 - 46 %   Lymphs Abs 1.6 0.7 - 4.0 K/uL   Monocytes Relative 7 3 - 12 %   Monocytes Absolute 0.3 0.1 - 1.0 K/uL    Eosinophils Relative 3 0 - 5 %   Eosinophils Absolute 0.1 0.0 - 0.7 K/uL   Basophils Relative 1 0 - 1 %   Basophils Absolute 0.0 0.0 - 0.1 K/uL   Imaging Review No results found.   EKG Interpretation None      MDM   Final diagnoses:  Posterior epistaxis  Essential hypertension  Hypertensive urgency  Tachycardia  Thrombocytopenia    59 y.o. male here again for epistaxis, seen earlier and offered nasal packing but epistaxis had resolved and pt declined. Was instructed to f/up with ENT this week, or return for worsening epistaxis. He has had 2 episodes since he left, and reports feeling some lightheadedness now. States the bleeding is going down the back of his throat. On exam, no anterior bleeding source found but appears to have a posterior bleeding source, moderate amount of bright red blood noted in posterior oropharynx. Pt is not on blood thinners but was noted to have a mildly low PLT count (132) on labs from earlier. He admits to me that he's a chronic alcoholic, binge drinks on the weekends. This raises the suspicion for liver dysfunction. Given his lightheadedness, will get istat chem 8, as well as CBC/CMP and INR/aPTT. Will set up for posterior packing to attempt to control bleeding. Pt's VSS at this time, but BP is 129/92 and HR 100 therefore will monitor closely for hemodynamic instability. Will reassess shortly.   2:12 PM Pt's family member reporting to me that he is bleeding again, arrived at room with pt having soaked through one towel, applied significant amount of pressure at nasal bridge for approx 10 minutes, sprayed one spray of afrin in each nostril which did not help. Pressure applied again, which helped slow down bleeding. Dr. Jodi Mourning arriving to assist with posterior rhino rocket placement into R nare. Will give fluids, pain and nausea meds now, and monitor closely. Will recheck shortly for resolution of bleeding. So far labs have returned showing no anemia, plt count  still 134. CMP, aPTT, and INR still pending. Will reassess shortly.   3:07 PM Upon reassessment, pt just began having epistaxis around the rhinorocket. BP noted to be 170s/110s, HR 110s. States the pressure from the rhinorocket is causing headache. Will give labetalol  now, morphine, and consult ENT. Will also consult medicine for admission for HTN urgency and inability to control epistaxis. Remainder of labs have returned, aPTT and INR WNL. CMP showing AST 40, bili 1.4.   3:30 PM Dr. Emeline Darling of ENT returning page, would like me to admit to medicine, and make pt NPO, will come see pt now. Awaiting medicine to call back.  4:05 PM Algis Downs PA-C with [facility]ad returning page. Will admit pt, admission  orders placed, please see her note for further documentation of care. Pt stable at this time.  BP 154/103 mmHg  Pulse 108  Temp(Src) 98 F (36.7 C) (Oral)  Resp 20  SpO2 93%    Siddhant Hashemi Camprubi-Soms, PA-C 04/04/14 1605  Blane OharaJoshua Zavitz, MD 04/05/14 [phone]

## 2014-04-04 NOTE — Discharge Instructions (Signed)
Nosebleed Nosebleeds can be caused by many conditions, including trauma, infections, polyps, foreign bodies, dry mucous membranes or climate, medicines, and air conditioning. Most nosebleeds occur in the front of the nose. Because of this location, most nosebleeds can be controlled by pinching the nostrils gently and continuously for at least 15 to 20 minutes. The long, continuous pressure allows enough time for the blood to clot. If pressure is released during that 10 to 20 minute time period, the process may have to be started again. The nosebleed may stop by itself or quit with pressure, or it may need concentrated heating (cautery) or pressure from packing. HOME CARE INSTRUCTIONS   If your nose was packed, try to maintain the pack inside until your health care provider removes it. If a gauze pack was used and it starts to fall out, gently replace it or cut the end off. Do not cut if a balloon catheter was used to pack the nose. Otherwise, do not remove unless instructed.  Avoid blowing your nose for 12 hours after treatment. This could dislodge the pack or clot and start the bleeding again.  If the bleeding starts again, sit up and bend forward, gently pinching the front half of your nose continuously for 20 minutes.  If bleeding was caused by dry mucous membranes, use over-the-counter saline nasal spray or gel. This will keep the mucous membranes moist and allow them to heal. If you must use a lubricant, choose the water-soluble variety. Use it only sparingly and not within several hours of lying down.  Do not use petroleum jelly or mineral oil, as these may drip into the lungs and cause serious problems.  Maintain humidity in your home by using less air conditioning or by using a humidifier.  Do not use aspirin or medicines which make bleeding more likely. Your health care provider can give you recommendations on this.  Resume normal activities as you are able, but try to avoid straining,  lifting, or bending at the waist for several days.  If the nosebleeds become recurrent and the cause is unknown, your health care provider may suggest laboratory tests. SEEK MEDICAL CARE IF: You have a fever. SEEK IMMEDIATE MEDICAL CARE IF:   Bleeding recurs and cannot be controlled.  There is unusual bleeding from or bruising on other parts of the body.  Nosebleeds continue.  There is any worsening of the condition which originally brought you in.  You become light-headed, feel faint, become sweaty, or vomit blood. MAKE SURE YOU:   Understand these instructions.  Will watch your condition.  Will get help right away if you are not doing well or get worse. Document Released: 10/04/2004 Document Revised: 05/11/2013 Document Reviewed: 11/25/2008 [facility] Center[facility] Patient Information 2015 [employer] [place]. This information is not intended to replace advice given to you by your health care provider. Make sure you discuss any questions you have with your health care provider.

## 2014-04-04 NOTE — Anesthesia Preprocedure Evaluation (Addendum)
Anesthesia Evaluation  Patient identified by MRN, date of birth, ID band Patient awake    Reviewed: Allergy & Precautions, NPO status , Patient's Chart, lab work & pertinent test results  History of Anesthesia Complications Negative for: history of anesthetic complications  Airway Mallampati: I  TM Distance: >3 FB Neck ROM: Full    Dental  (+) Edentulous Upper, Upper Dentures, Partial Lower, Dental Advisory Given, Teeth Intact   Pulmonary Current Smoker,          Cardiovascular hypertension, Pt. on medications     Neuro/Psych negative neurological ROS     GI/Hepatic negative GI ROS, (+)     substance abuse  alcohol use, Per D. Gore - pt has issues with alcohol abuse   Endo/Other  negative endocrine ROS  Renal/GU negative Renal ROS     Musculoskeletal negative musculoskeletal ROS (+)   Abdominal   Peds  Hematology negative hematology ROS (+)   Anesthesia Other Findings   Reproductive/Obstetrics                        Anesthesia Physical Anesthesia Plan  ASA: II and emergent  Anesthesia Plan: General   Post-op Pain Management:    Induction: Intravenous  Airway Management Planned: Oral ETT  Additional Equipment:   Intra-op Plan:   Post-operative Plan: Extubation in OR  Informed Consent: I have reviewed the patients History and Physical, chart, labs and discussed the procedure including the risks, benefits and alternatives for the proposed anesthesia with the patient or authorized representative who has indicated his/her understanding and acceptance.   Dental advisory given  Plan Discussed with: CRNA and Surgeon  Anesthesia Plan Comments:        Anesthesia Quick Evaluation

## 2014-04-04 NOTE — ED Notes (Signed)
Pt right nostril is bleeding around nasal rocket.

## 2014-04-04 NOTE — Progress Notes (Signed)
RN made aware that patient transported to [id number] by Emergency Room Tech and patient "transfered"  into room by [employer] Secretary using Epic. Patient still on stretcher when bedside RN called ED RN for clarification if patient went to OR. Jeanice LimHolly, ED RN said "No, he was supposed to be transported to short stay not to [id number]". RN stopped the ER Tech for delivering patient to room and redirected patient to short stay. 5W Secretary made aware.

## 2014-04-04 NOTE — ED Notes (Signed)
Per Fleet Contrasachel RN on 5W- OR has pt on schedule for 17:20 and will be sending a transporter to get pt from ED to head straight to OR.

## 2014-04-04 NOTE — Transfer of Care (Signed)
Immediate Anesthesia Transfer of Care Note  Patient: Larry Houston  Procedure(s) Performed: Procedure(s): NASAL ENDOSCOPY WITH EPISTAXIS CONTROL (N/A)  Patient Location: PACU  Anesthesia Type:General  Level of Consciousness: awake  Airway & Oxygen Therapy: Patient Spontanous Breathing and Patient connected to face mask oxygen  Post-op Assessment: Report given to RN and Post -op Vital signs reviewed and stable  Post vital signs: Reviewed and stable  Last Vitals:  Filed Vitals:   04/04/14 1600  BP: 144/101  Pulse: 100  Temp:   Resp:     Complications: No apparent anesthesia complications

## 2014-04-04 NOTE — Progress Notes (Signed)
[facility] received from  [facility],RN for admission to [phone]

## 2014-04-04 NOTE — Op Note (Addendum)
DATE OF OPERATION: @T @ Surgeon: Melvenia BeamGore, [place] Lebeau Procedure Performed: right endoscopic ligation of sphenopalatine artery and nasal endoscopy with cautery/control of epistaxis with left nasal endoscopy with control of epistaxis 31238-50 Right maxillary antrostomy with tissue removal 31267-right  PREOPERATIVE DIAGNOSIS: recurrent refractory  epistaxis POSTOPERATIVE DIAGNOSIS: recurrent refractory  epistaxis  SURGEON: Melvenia BeamGore, Dervin Vore ANESTHESIA: General endotracheal.  ESTIMATED BLOOD LOSS: Approximately 250 mL.  DRAINS/DRESSINGS: left nasal surgiflo absorbable packing, right nasal surgiflo and nasopore absorbable packing SPECIMENS: right nasal/septal contents INDICATIONS: The patient is a 59yo M with thrombocytopenia and hypertension and refractory R>L epistaxis  DESCRIPTION OF OPERATION: The patient was brought to the operating room and was placed in the supine position and was placed under general endotracheal anesthesia by anesthesiology. The patient's nose was inspected the nose was decongested with Afrin-coated pledgets which were then removed. The patient was prepped and draped in the usual sterile fashion. After the Afrin pledgets were removed, the inferior turbinates were outfractured. Bilateral greater palatine injections of 1% lidocaine with epinephrine were placed in the standard fashion.  I first began on the left. There was blood clot medial and lateral to the middle turbinate that I irrigated out and suctioned. I cauterized some oozing from the left middle turbinate and then filled the left nasal cavity with absorbable surgiflo hemostatic foam. Left sided hemostasis was noted.   Next I turned to the right side. The middle turbinate was deflected medially and the crista ethmoidalis was identified. The right inferior turbinate was noted to have pulsatile brisk bleeding from its medial surface so the right inferior turbinate and right anterior septum (which also had some scattered bleeding  from the septal mucosa) were cauterized thoroughly. Next I made a right maxillary antrostomy with the straight and curved Blakesley's and and removed the medial wall of the right maxillary sinus and the right uncinate with the Blakesleys. I removed the mucosa from the back wall of the right maxillary sinus to better visualize the right sphenopalatine foramen. The Cottle was used to elevate the mucosa off of the right crista ethmoidalis and the right sphenopalatine foramen was identified with the sphenopalatine artery coursing out of it. The Bovie suction cautery was used to ligate and cauterize the right sphenopalatine artery. Once hemostasis was noted I filled the right middle meatus and right nasal cavity with surgiflo and then bacitracin soaked nasopore absorbable packing x 2. The nose and stomach were suctioned out and the patient was turned back to anesthesia and awakened from anesthesia and extubated without difficulty. The patient tolerated the procedure well with no immediate complications and was taken to the postoperative recovery area in good condition.   Dr. Melvenia BeamMitchell Ebbie Cherry was present and performed the entire procedure. 04/04/2014  7:22 PM Melvenia BeamGore, Kenise Barraco   Plan: packing is all absorbable and will dissolve with time. Can follow up as needed. Will need to be on an oral antibiotic while packing is absorbing for ~ 2 weeks such as azithromycin or clindamycin for strep prophylaxis.

## 2014-04-04 NOTE — H&P (Signed)
04/04/2014  Larry Houston, [place] Name  PREOPERATIVE HISTORY AND PHYSICAL/CONSULT NOTE  CHIEF COMPLAINT: refractory epistaxis  HISTORY: This is a 59 year old who presents with hypertension and refractory epistaxis. ER was unable to control this so ENT is consulted for control of epistaxis. He now presents for nasal endoscopy with control of epistaxis.  Dr. Emeline DarlingGore, Clovis RileyMitchell has discussed the risks (septal perforation, recurrent bleeding, infection, need for packing, anesthesia risks), benefits, and alternatives of this procedure. The patient understands the risks and would like to proceed with the procedure. The chances of success of the procedure are >50% and the patient understands this. I personally performed an examination of the patient within 24 hours of the procedure.  PAST MEDICAL HISTORY: Past Medical History  Diagnosis Date  . Hypertension     PAST SURGICAL HISTORY: Past Surgical History  Procedure Laterality Date  . Neck surgery      MEDICATIONS: No current facility-administered medications on file prior to encounter.   Current Outpatient Prescriptions on File Prior to Encounter  Medication Sig Dispense Refill  . hydrochlorothiazide (HYDRODIURIL) 25 MG tablet Take 1 tablet (25 mg total) by mouth daily. 30 tablet 0  . hydrochlorothiazide (MICROZIDE) 12.5 MG capsule Take 12.5 mg by mouth daily. HASN'T TAKEN FOR MNTHS    . fexofenadine (ALLEGRA) 60 MG tablet Take 1 tablet (60 mg total) by mouth daily. 15 tablet 0    ALLERGIES: Allergies  Allergen Reactions  . Penicillins     SOCIAL HISTORY: History   Social History  . Marital Status: Divorced    Spouse Name: N/A  . Number of Children: N/A  . Years of Education: N/A   Occupational History  . Not on file.   Social History Main Topics  . Smoking status: Current Every Day Smoker  . Smokeless tobacco: Not on file  . Alcohol Use: Yes  . Drug Use: No  . Sexual Activity: Not on file   Other Topics Concern  . Not on file    Social History Narrative    FAMILY HISTORY: History reviewed. No pertinent family history.  REVIEW OF SYSTEMS:  HEENT: epistaxis, otherwise negative x 12 systems except per HPI   PHYSICAL EXAM:  GENERAL:  NAD VITAL SIGNS:   Filed Vitals:   04/04/14 1534  BP: 154/103  Pulse: 108  Temp:   Resp:    SKIN:  Warm, dry HEENT:  rhinorocket in right nasal cavity  NECK:  supple  ABDOMEN:  soft MUSCULOSKELETAL: normal strength PSYCH:  Normal affect NEUROLOGIC:  CN 2-12 intact and symmetric   ASSESSMENT AND PLAN: Plan to proceed with nasal endoscopy with control of epistaxis. Patient understands the risks, benefits, and alternatives. Informed written consent signed witnessed and on chart. 04/04/2014  3:59 PM Larry Houston, Jassmin Kemmerer

## 2014-04-04 NOTE — ED Notes (Signed)
Pt here for continued nosebleeds. sts 3 times since released from here. sts he has been olding pressure without relief and can feel it in the back of his throat.

## 2014-04-04 NOTE — ED Provider Notes (Signed)
CSN: [id number]     Arrival date & time 04/04/14  0518 History   First MD Initiated Contact with Patient 04/04/14 0601     Chief Complaint  Patient presents with  . Epistaxis   Larry Houston is a 59 y.o. male with a history of hypertension who presents to the ED complaining of an intermittent nose bleed out of his right nare since yesterday. He reports having three nose bleeds since yesterday, each lasting about 30 minutes. He tried to stop the nose bleed by blowing his nose. He reports becoming concerned after blowing out a clot and decided to come to the ED. He reports problems with allergies recently and reports nasal congestion, sneezing, and runny nose. He reports taking his HCTZ and took his last dose yesterday morning. He is not on any anticoagulants. He denies trauma to his nose. He denies hematuria, hematochezia, hematemesis, bleeding gums, easy bruising, headache, abdominal pain, or fevers.   (Consider location/radiation/quality/duration/timing/severity/associated sxs/prior Treatment) HPI  Past Medical History  Diagnosis Date  . Hypertension    Past Surgical History  Procedure Laterality Date  . Neck surgery     No family history on file. History  Substance Use Topics  . Smoking status: Current Every Day Smoker  . Smokeless tobacco: Not on file  . Alcohol Use: Yes    Review of Systems  Constitutional: Negative for fever and chills.  HENT: Positive for congestion, nosebleeds, postnasal drip, rhinorrhea and sneezing. Negative for ear pain, mouth sores, sinus pressure, sore throat and trouble swallowing.   Eyes: Negative for pain and visual disturbance.  Respiratory: Negative for cough, shortness of breath and wheezing.   Cardiovascular: Negative for chest pain and palpitations.  Gastrointestinal: Negative for nausea, vomiting, abdominal pain, diarrhea and blood in stool.  Genitourinary: Negative for dysuria and hematuria.  Musculoskeletal: Negative for back pain and neck  pain.  Skin: Negative for rash.  Neurological: Negative for headaches.      Allergies  Penicillins  Home Medications   Prior to Admission medications   Medication Sig Start Date End Date Taking? Authorizing Provider  fexofenadine (ALLEGRA) 60 MG tablet Take 1 tablet (60 mg total) by mouth daily. 04/26/11 04/25/12  Jimmie MollyPaolo Coll, MD  hydrochlorothiazide (HYDRODIURIL) 25 MG tablet Take 1 tablet (25 mg total) by mouth daily. 04/26/11 04/25/12  Jimmie MollyPaolo Coll, MD  hydrochlorothiazide (MICROZIDE) 12.5 MG capsule Take 12.5 mg by mouth daily. HASN'T TAKEN FOR MNTHS    Historical Provider, MD   BP 158/101 mmHg  Pulse 84  Temp(Src) 98.1 F (36.7 C)  Resp 18  Wt 243 lb (110.224 kg)  SpO2 99% Physical Exam  Constitutional: He appears well-developed and well-nourished. No distress.  HENT:  Head: Normocephalic and atraumatic.  Right Ear: External ear normal.  Left Ear: External ear normal.  Mouth/Throat: Oropharynx is clear and moist. No oropharyngeal exudate.  Dried blood in right nare. No active nose bleed. No sinus tenderness. Bilateral tympanic membranes are pearly-gray without erythema or loss of landmarks.  Used ENT tray and nasal speculum to view nose after nosebleed in ED and was unable to find source of bleed. No active bleeding.   Eyes: Conjunctivae are normal. Pupils are equal, round, and reactive to light. Right eye exhibits no discharge. Left eye exhibits no discharge.  Neck: Neck supple. No JVD present.  Cardiovascular: Normal rate, regular rhythm, normal heart sounds and intact distal pulses.  Exam reveals no gallop and no friction rub.   No murmur heard. Pulmonary/Chest: Effort normal  and breath sounds normal. No respiratory distress. He has no wheezes. He has no rales.  Abdominal: Soft. There is no tenderness.  Musculoskeletal: He exhibits no edema.  No lower extremity edema.   Lymphadenopathy:    He has no cervical adenopathy.  Neurological: He is alert. Coordination normal.   Skin: Skin is warm and dry. No rash noted. He is not diaphoretic. No erythema. No pallor.  Psychiatric: He has a normal mood and affect. His behavior is normal.  Nursing note and vitals reviewed.   ED Course  Procedures (including critical care time) Labs Review Labs Reviewed  CBC WITH DIFFERENTIAL/PLATELET - Abnormal; Notable for the following:    Platelets 132 (*)    All other components within normal limits    Imaging Review No results found.   EKG Interpretation None     Filed Vitals:   04/04/14 0715 04/04/14 0730 04/04/14 0800 04/04/14 0814  BP: 157/100 151/102 137/100 158/101  Pulse: 106 83 83 84  Temp:      Resp:  18  18  Weight:      SpO2: 97% 98% 98% 99%    MDM   Meds given in ED:  Medications  metoprolol (LOPRESSOR) injection 5 mg (5 mg Intravenous Given 04/04/14 0712)    New Prescriptions   No medications on file    Final diagnoses:  Epistaxis   This is a 59 y.o. male with a history of hypertension who presents to the ED complaining of an intermittent nose bleed out of his right nare since yesterday. He reports having three nose bleeds since yesterday, each lasting about 30 minutes. He reports he has not taken his morning blood pressure medication today. He is afebrile and nontoxic appearing. He is not on any anticoagulant medications. He denies trauma to his nose pretty does report allergy symptoms over the past several days. Initially the patient has no active nosebleed in the emergency department. While he is here she did start having nosebleed again but was able to stop this quickly on his own with holding his nose. By the time I was able to see his nose it had stopped bleeding again. CBC is unremarkable. After discussing with Dr. Devoria Albe it was suggested he get 5 mg lopressor IV for his elevated blood pressure. His pressure improved to 149/87 prior to discharge. I offered nasal packing, despite no active bleed and the patient refused. I reviewed  methods to control bleeding and advised him to return if he is unable to stop the bleeding. I advised to follow up with ENT this week if this continues. I advised the patient to follow-up with their primary care provider this week. I advised the patient to return to the emergency department with new or worsening symptoms or new concerns. The patient verbalized understanding and agreement with plan.       Everlene Farrier, PA-C 04/04/14 1610  Devoria Albe, MD 04/04/14 [phone]

## 2014-04-04 NOTE — Anesthesia Procedure Notes (Signed)
Procedure Name: Intubation Date/Time: 04/04/2014 6:13 PM Performed by: Nicholos JohnsMCPHAIL, Eoghan Belcher S Pre-anesthesia Checklist: Patient identified, Timeout performed, Emergency Drugs available, Suction available and Patient being monitored Patient Re-evaluated:Patient Re-evaluated prior to inductionOxygen Delivery Method: Circle system utilized Preoxygenation: Pre-oxygenation with 100% oxygen Intubation Type: IV induction Ventilation: Mask ventilation without difficulty Laryngoscope Size: Mac and 4 Grade View: Grade II Tube type: Oral Tube size: 8.0 mm Number of attempts: 1 Airway Equipment and Method: Stylet Placement Confirmation: ETT inserted through vocal cords under direct vision,  breath sounds checked- equal and bilateral and positive ETCO2 Secured at: 22 cm Tube secured with: Tape Dental Injury: Teeth and Oropharynx as per pre-operative assessment

## 2014-04-05 ENCOUNTER — Encounter (HOSPITAL_COMMUNITY): Payer: Self-pay | Admitting: Otolaryngology

## 2014-04-05 DIAGNOSIS — R040 Epistaxis: Principal | ICD-10-CM

## 2014-04-05 LAB — COMPREHENSIVE METABOLIC PANEL
ALBUMIN: 3.2 g/dL — AB (ref 3.5–5.2)
ALT: 24 U/L (ref 0–53)
AST: 27 U/L (ref 0–37)
Alkaline Phosphatase: 51 U/L (ref 39–117)
Anion gap: 5 (ref 5–15)
BUN: 10 mg/dL (ref 6–23)
CALCIUM: 8.7 mg/dL (ref 8.4–10.5)
CHLORIDE: 105 mmol/L (ref 96–112)
CO2: 30 mmol/L (ref 19–32)
Creatinine, Ser: 1.04 mg/dL (ref 0.50–1.35)
GFR calc Af Amer: >90 mL/min (ref >90)
GFR calc non Af Amer: 78 mL/min — ABNORMAL LOW (ref >90)
Glucose, Bld: 138 mg/dL — ABNORMAL HIGH (ref 70–99)
Potassium: 4 mmol/L (ref 3.5–5.1)
Sodium: 140 mmol/L (ref 135–145)
TOTAL PROTEIN: 6 g/dL (ref 6.0–8.3)
Total Bilirubin: 1.4 mg/dL — ABNORMAL HIGH (ref 0.3–1.2)

## 2014-04-05 LAB — CBC
HCT: 35 % — ABNORMAL LOW (ref 39.0–52.0)
Hemoglobin: 11.9 g/dL — ABNORMAL LOW (ref 13.0–17.0)
MCH: 32.8 pg (ref 26.0–34.0)
MCHC: 34 g/dL (ref 30.0–36.0)
MCV: 96.4 fL (ref 78.0–100.0)
PLATELETS: 103 10*3/uL — AB (ref 150–400)
RBC: 3.63 MIL/uL — ABNORMAL LOW (ref 4.22–5.81)
RDW: 13.6 % (ref 11.5–15.5)
WBC: 6.5 10*3/uL (ref 4.0–10.5)

## 2014-04-05 LAB — TYPE AND SCREEN
ABO/RH(D): B POS
Antibody Screen: NEGATIVE

## 2014-04-05 LAB — ABO/RH: ABO/RH(D): B POS

## 2014-04-05 MED ORDER — CARVEDILOL 6.25 MG PO TABS: 6.2500 mg | ORAL_TABLET | Freq: Two times a day (BID) | ORAL | Status: DC

## 2014-04-05 MED ORDER — CLINDAMYCIN HCL 300 MG PO CAPS: 300.0000 mg | ORAL_CAPSULE | Freq: Three times a day (TID) | ORAL | Status: DC

## 2014-04-05 MED ORDER — FOLIC ACID 1 MG PO TABS: 1.0000 mg | ORAL_TABLET | Freq: Every day | ORAL | Status: DC

## 2014-04-05 MED ORDER — THIAMINE HCL 100 MG PO TABS: 100.0000 mg | ORAL_TABLET | Freq: Every day | ORAL | Status: DC

## 2014-04-05 NOTE — Discharge Summary (Addendum)
Larry Houston, is a 59 y.o. male  DOB 21-Feb-1955  MRN [id number].  Admission date:  04/04/2014  Admitting Physician  Eddie North, MD  Discharge Date:  04/05/2014   Primary MD  Lupe Carney, MD  Recommendations for primary care physician for things to follow:   Follow CBC closely.   Will need one-time outpatient GI follow-up for possible alcoholic cirrhosis.  Needs to see ENT in 3-4 days.   Admission Diagnosis  Tachycardia [R00.0] Thrombocytopenia [D69.6] Hypertensive urgency [I10] Essential hypertension [I10] Posterior epistaxis [R04.0]   Discharge Diagnosis  Tachycardia [R00.0] Thrombocytopenia [D69.6] Hypertensive urgency [I10] Essential hypertension [I10] Posterior epistaxis [R04.0]     Principal Problem:   Epistaxis Active Problems:   Hypertensive urgency   Hypertension   Lower extremity pain, bilateral   Alcohol use   Thrombocytopenia      Past Medical History  Diagnosis Date  . Hypertension     Past Surgical History  Procedure Laterality Date  . Neck surgery    . Nasal endoscopy with epistaxis control N/A 04/04/2014    Procedure: NASAL ENDOSCOPY WITH EPISTAXIS CONTROL;  Surgeon: Melvenia Beam, MD;  Location: [facility] OR;  Service: ENT;  Laterality: N/A;    History of present illness and  Hospital Course:     Kindly see H&P for history of present illness and admission details, please review complete Labs, Consult reports and Test reports for all details in brief  HPI  from the history and physical done on the day of admission   Larry Houston is a 59 y.o. 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 He has a past medical history of hypertension, alcohol and tobacco use. He reports having epistaxis starting yesterday. He had 3 bleeds yesterday into more overnight. At  approximately 3:30 AM's morning he decided to come to the ER as his bleeding would not stop. He has noticed his stools getting black. In the ER he proceeded to have several more bleeds but it stabilized and he went home. 2 hours after arriving home the bleeding restarted and he came back to the ER. He has been seen by ENT and Dr. Emeline Darling plans to take him to the operating room this evening. In addition to his epistaxis the patient complains of seasonal allergy irritation. He mentions that he takes Advil and Aleve every day and has for the past 6 months due to lower extremity pain. He also uses Tylenol PM each night to sleep. He reports that he consumes approximately 6-10 ounces of whiskey per week and smokes approximately one pack of cigarettes per week. When asked about family history he mentions that his mother died of aplastic anemia.  In the ER he is found to have low platelets (132) and elevated blood pressure (172/113). A Rhino Rocket was placed by the ER physician. ENT is consulted.   Hospital Course   1.Epistaxis - seen by ENT, was taken to or by Dr. Emeline Darling and underwent cauterization and packing, packing is still intact on the right  side, his bleed had stopped and he was technically discharged this morning however prior to leaving for home his nosebleed reoccurred, discussed his case with ENT on call Dr. Jearld Fenton, Dr. Jearld Fenton ENT recommended that we have exhausted all possibilities and the patient should be seen by IR for embolization. I then called IR and Dr. Manson Passey was on call who recommended that patient be sent to [facility] where he is physically present as he cannot come to [facility] at this time.  Clindamycin x 2 weeks, script printed and faxed to [facility], home phone message also left to take the med for 2 weeks after DC from [place].    2. Underlying thrombocytopenia. Does have history of alcohol use, he is not very forthright with information, we will request PCP to monitor platelet counts and  consider one-time outpatient on the cytopenia workup   3. Hypertension hypertension. Noncompliant with his home medications. Counseled on compliance. We will place on low-dose beta blocker.   4. History of smoking and questionable alcohol use. Counseled to quit both.     Discharge Condition: Stable   Follow UP  Follow-up Information    Follow up with Lupe Carney, MD. Schedule an appointment as soon as possible for a visit in 1 week.   Specialty:  Family Medicine   Why:  Doctors office will call you at home with appointment   Contact information:   [address] [place] [address] [phone]       Follow up with Melvenia Beam, MD. Schedule an appointment as soon as possible for a visit on 04/07/2014.   Specialty:  Otolaryngology   Why:  Nasal Packing/ Appointment with Dr is on 04/07/14 at [facility] information:   [address] [place] [address] [phone]         Discharge Instructions  and  Discharge Medications          Discharge Instructions    Diet - low sodium heart healthy    Complete by:  As directed      Discharge instructions    Complete by:  As directed   Do not remove Nasal Packing.  Follow with Primary MD Lupe Carney, MD in 7 days   Get CBC, CMP, 2 view Chest X ray checked  by Primary MD next visit.    Activity: As tolerated with Full fall precautions use walker/cane & assistance as needed   Disposition Home    Diet: Heart Healthy   For Heart failure patients - Check your Weight same time everyday, if you gain over 2 pounds, or you develop in leg swelling, experience more shortness of breath or chest pain, call your Primary MD immediately. Follow Cardiac Low Salt Diet and 1.5 lit/day fluid restriction.   On your next visit with your primary care physician please Get Medicines reviewed and adjusted.   Please request your Prim.MD to go over all Hospital Tests and Procedure/Radiological results  at the follow up, please get all Hospital records sent to your Prim MD by signing hospital release before you go home.   If you experience worsening of your admission symptoms, develop shortness of breath, life threatening emergency, suicidal or homicidal thoughts you must seek medical attention immediately by calling 911 or calling your MD immediately  if symptoms less severe.  You Must read complete instructions/literature along with all the possible adverse reactions/side effects for all the Medicines you take and that have been prescribed to you.  Take any new Medicines after you have completely understood and accpet all the possible adverse reactions/side effects.   Do not drive, operating heavy machinery, perform activities at heights, swimming or participation in water activities or provide baby sitting services if your were admitted for syncope or siezures until you have seen by Primary MD or a Neurologist and advised to do so again.  Do not drive when taking Pain medications.    Do not take more than prescribed Pain, Sleep and Anxiety Medications  Special Instructions: If you have smoked or chewed Tobacco  in the last 2 yrs please stop smoking, stop any regular Alcohol  and or any Recreational drug use.  Wear Seat belts while driving.   Please note  You were cared for by a hospitalist during your hospital stay. If you have any questions about your discharge medications or the care you received while you were in the hospital after you are discharged, you can call the unit and asked to speak with the hospitalist on call if the hospitalist that took care of you is not available. Once you are discharged, your primary care physician will handle any further medical issues. Please note that NO REFILLS for any discharge medications will be authorized once you are discharged, as it is imperative that you return to your primary care physician (or establish a relationship with a primary care  physician if you do not have one) for your aftercare needs so that they can reassess your need for medications and monitor your lab values.     Increase activity slowly    Complete by:  As directed             Medication List    STOP taking these medications        naproxen sodium 220 MG tablet  Commonly known as:  ANAPROX      TAKE these medications        acetaminophen 500 MG tablet  Commonly known as:  TYLENOL  Take 500 mg by mouth every 6 (six) hours as needed for mild pain.     carvedilol 6.25 MG tablet  Commonly known as:  COREG  Take 1 tablet (6.25 mg total) by mouth 2 (two) times daily with a meal.     clindamycin 300 MG capsule  Commonly known as:  CLEOCIN  Take 1 capsule (300 mg total) by mouth 3 (three) times daily.     diphenhydramine-acetaminophen 25-500 MG Tabs  Commonly known as:  TYLENOL PM  Take 1 tablet by mouth at bedtime as needed (sleep).     fexofenadine 60 MG tablet  Commonly known as:  ALLEGRA  Take 1 tablet (60 mg total) by mouth daily.     folic acid 1 MG tablet  Commonly known as:  FOLVITE  Take 1 tablet (1 mg total) by mouth daily.     hydrochlorothiazide 12.5 MG capsule  Commonly known as:  MICROZIDE  Take 12.5 mg by mouth daily. HASN'T TAKEN FOR MNTHS     thiamine 100 MG tablet  Take 1 tablet (100 mg total) by mouth daily.          Diet and Activity recommendation: See Discharge Instructions above   Consults obtained - ENT   Major procedures and Radiology Reports - PLEASE review detailed and final reports for all details, in brief -       No results found.  Micro Results      No results found for this or any previous  visit (from the past 240 hour(s)).     Today   Subjective:   Larry Houston today has no headache,no chest abdominal pain,no new weakness tingling or numbness, feels much better wants to go home today.    Objective:   Blood pressure 164/88, pulse 110, temperature 98.4 F (36.9 C),  temperature source Oral, resp. rate 20, height 6\' 1"  (1.854 m), weight 109.7 kg (241 lb 13.5 oz), SpO2 95 %.   Intake/Output Summary (Last 24 hours) at 04/05/14 1546 Last data filed at 04/05/14 1345  Gross per 24 hour  Intake   2155 ml  Output   1295 ml  Net    860 ml    Exam Awake Alert, Oriented x 3, No new F.N deficits, Normal affect [place].AT,PERRAL, right nasal packing intact Supple Neck,No JVD, No cervical lymphadenopathy appriciated.  Symmetrical Chest wall movement, Good air movement bilaterally, CTAB RRR,No Gallops,Rubs or new Murmurs, No Parasternal Heave +ve B.Sounds, Abd Soft, Non tender, No organomegaly appriciated, No rebound -guarding or rigidity. No Cyanosis, Clubbing or edema, No new Rash or bruise  Data Review   CBC w Diff:  Lab Results  Component Value Date   WBC 6.5 04/05/2014   HGB 11.9* 04/05/2014   HCT 35.0* 04/05/2014   PLT 103* 04/05/2014   LYMPHOPCT 38 04/04/2014   MONOPCT 7 04/04/2014   EOSPCT 3 04/04/2014   BASOPCT 1 04/04/2014    CMP:  Lab Results  Component Value Date   NA 140 04/05/2014   K 4.0 04/05/2014   CL 105 04/05/2014   CO2 30 04/05/2014   BUN 10 04/05/2014   CREATININE 1.04 04/05/2014   PROT 6.0 04/05/2014   ALBUMIN 3.2* 04/05/2014   BILITOT 1.4* 04/05/2014   ALKPHOS 51 04/05/2014   AST 27 04/05/2014   ALT 24 04/05/2014  .   Total Time in preparing paper work, data evaluation and todays exam - 35 minutes  Leroy Sea M.D on 04/05/2014 at 3:46 PM  [facility]   Office  [phone]

## 2014-04-05 NOTE — Progress Notes (Signed)
Pt  is received from OR, is alert and oriented. Pt had small amount of bleeding from Rt nare  upon arrival to unit which stopped after a pressure was applied for 2-3 mins, accompanying nurses present during bleeding. Pt denies pain at this time. VSS. Will cont to monitor.

## 2014-04-05 NOTE — Progress Notes (Signed)
Pt had small amount of bleeding from Rt nostril. Pressure applied and packed with 2x2 gauze. Bleeding stopped . Will cont to monitor.

## 2014-04-05 NOTE — Discharge Instructions (Signed)
Do not remove Nasal Packing.  Follow with Primary MD Lupe Carneyean Mitchell, MD in 7 days   Get CBC, CMP, 2 view Chest X ray checked  by Primary MD next visit.    Activity: As tolerated with Full fall precautions use walker/cane & assistance as needed   Disposition Home    Diet: Heart Healthy   For Heart failure patients - Check your Weight same time everyday, if you gain over 2 pounds, or you develop in leg swelling, experience more shortness of breath or chest pain, call your Primary MD immediately. Follow Cardiac Low Salt Diet and 1.5 lit/day fluid restriction.   On your next visit with your primary care physician please Get Medicines reviewed and adjusted.   Please request your Prim.MD to go over all Hospital Tests and Procedure/Radiological results at the follow up, please get all Hospital records sent to your Prim MD by signing hospital release before you go home.   If you experience worsening of your admission symptoms, develop shortness of breath, life threatening emergency, suicidal or homicidal thoughts you must seek medical attention immediately by calling 911 or calling your MD immediately  if symptoms less severe.  You Must read complete instructions/literature along with all the possible adverse reactions/side effects for all the Medicines you take and that have been prescribed to you. Take any new Medicines after you have completely understood and accpet all the possible adverse reactions/side effects.   Do not drive, operating heavy machinery, perform activities at heights, swimming or participation in water activities or provide baby sitting services if your were admitted for syncope or siezures until you have seen by Primary MD or a Neurologist and advised to do so again.  Do not drive when taking Pain medications.    Do not take more than prescribed Pain, Sleep and Anxiety Medications  Special Instructions: If you have smoked or chewed Tobacco  in the last 2 yrs please  stop smoking, stop any regular Alcohol  and or any Recreational drug use.  Wear Seat belts while driving.   Please note  You were cared for by a hospitalist during your hospital stay. If you have any questions about your discharge medications or the care you received while you were in the hospital after you are discharged, you can call the unit and asked to speak with the hospitalist on call if the hospitalist that took care of you is not available. Once you are discharged, your primary care physician will handle any further medical issues. Please note that NO REFILLS for any discharge medications will be authorized once you are discharged, as it is imperative that you return to your primary care physician (or establish a relationship with a primary care physician if you do not have one) for your aftercare needs so that they can reassess your need for medications and monitor your lab values.

## 2014-04-06 MED ORDER — CLINDAMYCIN HCL 300 MG PO CAPS: 300.0000 mg | ORAL_CAPSULE | Freq: Three times a day (TID) | ORAL | Status: DC

## 2014-07-29 ENCOUNTER — Other Ambulatory Visit (HOSPITAL_COMMUNITY): Payer: Self-pay | Admitting: Family Medicine

## 2014-07-29 DIAGNOSIS — M79604 Pain in right leg: Secondary | ICD-10-CM

## 2014-07-29 DIAGNOSIS — M79605 Pain in left leg: Secondary | ICD-10-CM

## 2014-08-04 ENCOUNTER — Ambulatory Visit (HOSPITAL_COMMUNITY)
Admission: RE | Admit: 2014-08-04 | Discharge: 2014-08-04 | Disposition: A | Discharge status: Home / Self Care | Payer: 59 | Source: Ambulatory Visit | Attending: Family Medicine | Admitting: Family Medicine

## 2014-08-04 DIAGNOSIS — M5126 Other intervertebral disc displacement, lumbar region: Secondary | ICD-10-CM | POA: Diagnosis not present

## 2014-08-04 DIAGNOSIS — M5127 Other intervertebral disc displacement, lumbosacral region: Secondary | ICD-10-CM | POA: Diagnosis not present

## 2014-08-04 DIAGNOSIS — M79605 Pain in left leg: Secondary | ICD-10-CM

## 2014-08-04 DIAGNOSIS — M4806 Spinal stenosis, lumbar region: Secondary | ICD-10-CM | POA: Insufficient documentation

## 2014-08-04 DIAGNOSIS — M79604 Pain in right leg: Secondary | ICD-10-CM

## 2014-08-04 DIAGNOSIS — M79606 Pain in leg, unspecified: Secondary | ICD-10-CM | POA: Diagnosis present

## 2014-11-01 ENCOUNTER — Other Ambulatory Visit (HOSPITAL_COMMUNITY): Payer: Self-pay | Admitting: Neurological Surgery

## 2014-11-12 ENCOUNTER — Other Ambulatory Visit (HOSPITAL_COMMUNITY): Payer: 59

## 2015-01-14 ENCOUNTER — Ambulatory Visit (HOSPITAL_COMMUNITY): Admission: RE | Admit: 2015-01-14 | Payer: 59 | Source: Ambulatory Visit

## 2015-01-14 ENCOUNTER — Encounter (HOSPITAL_COMMUNITY)
Admission: RE | Admit: 2015-01-14 | Discharge: 2015-01-14 | Disposition: A | Discharge status: Home / Self Care | Payer: 59 | Source: Ambulatory Visit | Attending: Neurological Surgery | Admitting: Neurological Surgery

## 2015-01-14 ENCOUNTER — Ambulatory Visit (HOSPITAL_COMMUNITY)
Admission: RE | Admit: 2015-01-14 | Discharge: 2015-01-14 | Disposition: A | Discharge status: Home / Self Care | Payer: 59 | Source: Ambulatory Visit | Attending: Neurological Surgery | Admitting: Neurological Surgery

## 2015-01-14 ENCOUNTER — Encounter (HOSPITAL_COMMUNITY): Payer: Self-pay

## 2015-01-14 DIAGNOSIS — M48061 Spinal stenosis, lumbar region without neurogenic claudication: Secondary | ICD-10-CM

## 2015-01-14 DIAGNOSIS — R918 Other nonspecific abnormal finding of lung field: Secondary | ICD-10-CM | POA: Insufficient documentation

## 2015-01-14 DIAGNOSIS — M4806 Spinal stenosis, lumbar region: Secondary | ICD-10-CM | POA: Insufficient documentation

## 2015-01-14 DIAGNOSIS — I10 Essential (primary) hypertension: Secondary | ICD-10-CM | POA: Diagnosis not present

## 2015-01-14 DIAGNOSIS — Z01818 Encounter for other preprocedural examination: Secondary | ICD-10-CM | POA: Diagnosis not present

## 2015-01-14 DIAGNOSIS — Z01812 Encounter for preprocedural laboratory examination: Secondary | ICD-10-CM | POA: Insufficient documentation

## 2015-01-14 DIAGNOSIS — Z01810 Encounter for preprocedural cardiovascular examination: Secondary | ICD-10-CM | POA: Insufficient documentation

## 2015-01-14 DIAGNOSIS — Z87891 Personal history of nicotine dependence: Secondary | ICD-10-CM | POA: Diagnosis not present

## 2015-01-14 HISTORY — DX: Unspecified osteoarthritis, unspecified site: M19.90

## 2015-01-14 LAB — BASIC METABOLIC PANEL
ANION GAP: 10 (ref 5–15)
BUN: 11 mg/dL (ref 6–20)
CALCIUM: 9.5 mg/dL (ref 8.9–10.3)
CO2: 27 mmol/L (ref 22–32)
Chloride: 101 mmol/L (ref 101–111)
Creatinine, Ser: 0.96 mg/dL (ref 0.61–1.24)
GFR calc Af Amer: >60 mL/min (ref >60)
GFR calc non Af Amer: >60 mL/min (ref >60)
GLUCOSE: 123 mg/dL — AB (ref 65–99)
POTASSIUM: 4.8 mmol/L (ref 3.5–5.1)
Sodium: 138 mmol/L (ref 135–145)

## 2015-01-14 LAB — CBC WITH DIFFERENTIAL/PLATELET
Basophils Absolute: 0 10*3/uL (ref 0.0–0.1)
Basophils Relative: 0 %
Eosinophils Absolute: 0.2 10*3/uL (ref 0.0–0.7)
Eosinophils Relative: 3 %
HEMATOCRIT: 46.7 % (ref 39.0–52.0)
Hemoglobin: 16.1 g/dL (ref 13.0–17.0)
LYMPHS PCT: 37 %
Lymphs Abs: 1.9 10*3/uL (ref 0.7–4.0)
MCH: 33 pg (ref 26.0–34.0)
MCHC: 34.5 g/dL (ref 30.0–36.0)
MCV: 95.7 fL (ref 78.0–100.0)
MONO ABS: 0.4 10*3/uL (ref 0.1–1.0)
MONOS PCT: 7 %
NEUTROS ABS: 2.7 10*3/uL (ref 1.7–7.7)
Neutrophils Relative %: 53 %
Platelets: 117 10*3/uL — ABNORMAL LOW (ref 150–400)
RBC: 4.88 MIL/uL (ref 4.22–5.81)
RDW: 13.1 % (ref 11.5–15.5)
WBC: 5.1 10*3/uL (ref 4.0–10.5)

## 2015-01-14 LAB — PROTIME-INR
INR: 1.04 (ref 0.00–1.49)
Prothrombin Time: 13.8 seconds (ref 11.6–15.2)

## 2015-01-14 LAB — SURGICAL PCR SCREEN
MRSA, PCR: NEGATIVE
Staphylococcus aureus: POSITIVE — AB

## 2015-01-14 MED FILL — MUPIROCIN 2% OINTMENT: 2 | 5 days supply | Qty: 22 | Fill #0

## 2015-01-14 NOTE — Pre-Procedure Instructions (Signed)
Larry Houston  01/14/2015      [facility] [place] - [address]  [place] [id number] Phone: [phone] Fax: [phone]    Your procedure is scheduled on 01/26/2015.  [facility] Admitting at 7:30 A.M.  Call this number if you have problems the morning of surgery:  [phone]   Remember:  Do not eat food or drink liquids after midnight. On Tuesday - 1/17   Take these medicines the morning of surgery with A SIP OF WATER : AMLODIPINE, ( if needed take pain med.)   Do not wear jewelry    Do not wear lotions, powders, or perfumes.  You may wear deodorant.    Men may shave face and neck.   Do not bring valuables to the hospital.   [facility] is not responsible for any belongings or valuables.  Contacts, dentures or bridgework may not be worn into surgery.  Leave your suitcase in the car.  After surgery it may be brought to your room.  For patients admitted to the hospital, discharge time will be determined by your treatment team.  Patients discharged the day of surgery will not be allowed to drive home.   Name and phone number of your driver:   With friend   Special instructions:  Special Instructions: [place]  - Preparing for Surgery  Before surgery, you can play an important role.  Because skin is not sterile, your skin needs to be as free of germs as possible.  You can reduce the number of germs on you skin by washing with CHG (chlorahexidine gluconate) soap before surgery.  CHG is an antiseptic cleaner which kills germs and bonds with the skin to continue killing germs even after washing.  Please DO NOT use if you have an allergy to CHG or antibacterial soaps.  If your skin becomes reddened/irritated stop using the CHG and inform your nurse when you arrive at Short Stay.  Do not shave (including legs and underarms) for at least 48 hours prior to the first CHG shower.  You  may shave your face.  Please follow these instructions carefully:   1.  Shower with CHG Soap the night before surgery and the  morning of Surgery.  2.  If you choose to wash your hair, wash your hair first as usual with your  normal shampoo.  3.  After you shampoo, rinse your hair and body thoroughly to remove the  Shampoo.  4.  Use CHG as you would any other liquid soap.  You can apply chg directly to the skin and wash gently with scrungie or a clean washcloth.  5.  Apply the CHG Soap to your body ONLY FROM THE NECK DOWN.    Do not use on open wounds or open sores.  Avoid contact with your eyes, ears, mouth and genitals (private parts).  Wash genitals (private parts)   with your normal soap.  6.  Wash thoroughly, paying special attention to the area where your surgery will be performed.  7.  Thoroughly rinse your body with warm water from the neck down.  8.  DO NOT shower/wash with your normal soap after using and rinsing off   the CHG Soap.  9.  Pat yourself dry with a clean towel.            10.  Wear clean pajamas.  11.  Place clean sheets on your bed the night of your first shower and do not sleep with pets.  Day of Surgery  Do not apply any lotions/deodorants the morning of surgery.  Please wear clean clothes to the hospital/surgery center.  Please read over the following fact sheets that you were given. Pain Booklet, Coughing and Deep Breathing, MRSA Information and Surgical Site Infection Prevention

## 2015-01-14 NOTE — Progress Notes (Signed)
Followed by D. Mitchell for PCP, denies cardiac concerns, chest pain, difficulty breathing.  Denies ever having any advanced cardiac testing.

## 2015-01-14 NOTE — Progress Notes (Signed)
I called a prescription for Mupirocin ointment to [facility].

## 2015-01-14 NOTE — Progress Notes (Signed)
Will request that plt. Count be looked at by anesth. Dept., trends low.

## 2015-01-17 NOTE — Progress Notes (Addendum)
Anesthesia Chart Review: Patient is a 60 year old male scheduled for laminectomy and foraminotomy L4-5 on 01/26/2015 by Dr. Yetta BarreJones.  He is a [employer] employee.  History includes former smoker, HTN, arthritis, neck surgery '92, nasal endoscopy for epistaxis control 04/04/14. Reports "occasional" ETOH use. (Hospitalist H&P from 04/04/14 states, "He mentions that he takes Advil and Aleve every day and has for the past 6 months due to lower extremity pain. He also uses Tylenol PM each night to sleep. He reports that he consumes approximately 6-10 ounces of whiskey per week and smokes approximately one pack of cigarettes per week. When asked about family history he mentions that his mother died of aplastic anemia." They thought his thrombocytopenia appeared chronic since at least 2010, so recommended ETOH cessation and continued follow-up with his PCP Dr. Elsworth SohoL. Dean Mitchell. They would defer decision for hematology referral to Dr. Clovis RileyMitchell.) BMI is 31 consistent with mild obesity.   Meds include amlodipine, Percocet, Tylenol.  01/14/15 EKG: NSR, minimal voltage criteria for LVH, may be normal variant.  Preoperative labs noted. PLT 117K, previously 103-134K 03/2014 (also 104K on 09/05/08). (LFTS on 04/05/14 showed normal AST/ALT with total bilirubin slightly elevated at 1.4.) INR WNL. K 4.8, but specimen was hemolyzed.   Records requested from Dr. Clovis RileyMitchell, and will review if received. However, since thrombocytopenia appears chronic and PLT > 100K, I would anticipate that he could proceed as planned.  Velna Ochsllison Haddy Mullinax, PA-C [facility]/Anesthesiology Phone [phone] 01/17/2015 7:27 PM  Addendum: Previously reviewed labs and history with anesthesiologist Dr. Glade Stanford. Fitzgerald who agreed that labs appear acceptable for OR. Last AST/ALT WNL on 04/05/14 and 06/08/14, so would not be inclined to repeat pre-operatively. Records received from Dr. Clovis RileyMitchell, last visit 12/08/14. Nothing specifically mentioned  about thrombocytopenia follow-up although last CBC there showed a low platelet count of 132K on 06/08/14. I will route a cope of his PAT labs to Dr. Clovis RileyMitchell.  Velna Ochsllison Justino Boze, PA-C [facility]/Anesthesiology Phone [phone] 01/20/2015 3:44 PM

## 2015-01-20 MED FILL — ECONAZOLE NITRATE 1% CREAM: 1 | 30 days supply | Qty: 30 | Fill #0

## 2015-01-25 MED ORDER — VANCOMYCIN HCL 10 G IV SOLR
1500.0000 mg | INTRAVENOUS | Status: AC
  Administered 2015-01-26: 1500 mg via INTRAVENOUS
  Filled 2015-01-25: qty 1500

## 2015-01-26 ENCOUNTER — Ambulatory Visit (HOSPITAL_COMMUNITY): Payer: 59 | Admitting: Anesthesiology

## 2015-01-26 ENCOUNTER — Ambulatory Visit (HOSPITAL_COMMUNITY): Payer: 59

## 2015-01-26 ENCOUNTER — Ambulatory Visit (HOSPITAL_COMMUNITY): Payer: 59 | Admitting: Vascular Surgery

## 2015-01-26 ENCOUNTER — Encounter (HOSPITAL_COMMUNITY): Payer: Self-pay | Admitting: *Deleted

## 2015-01-26 ENCOUNTER — Encounter (HOSPITAL_COMMUNITY)
Admission: RE | Disposition: A | Discharge status: Home / Self Care | Payer: Self-pay | Source: Ambulatory Visit | Attending: Neurological Surgery

## 2015-01-26 ENCOUNTER — Ambulatory Visit (HOSPITAL_COMMUNITY)
Admission: RE | Admit: 2015-01-26 | Discharge: 2015-01-27 | Disposition: A | Discharge status: Home / Self Care | Payer: 59 | Source: Ambulatory Visit | Attending: Neurological Surgery | Admitting: Neurological Surgery

## 2015-01-26 DIAGNOSIS — M1990 Unspecified osteoarthritis, unspecified site: Secondary | ICD-10-CM | POA: Insufficient documentation

## 2015-01-26 DIAGNOSIS — Z880 Allergy status to penicillin: Secondary | ICD-10-CM | POA: Diagnosis not present

## 2015-01-26 DIAGNOSIS — Z87891 Personal history of nicotine dependence: Secondary | ICD-10-CM | POA: Diagnosis not present

## 2015-01-26 DIAGNOSIS — M4806 Spinal stenosis, lumbar region: Secondary | ICD-10-CM | POA: Insufficient documentation

## 2015-01-26 DIAGNOSIS — I10 Essential (primary) hypertension: Secondary | ICD-10-CM | POA: Diagnosis not present

## 2015-01-26 DIAGNOSIS — M549 Dorsalgia, unspecified: Secondary | ICD-10-CM

## 2015-01-26 DIAGNOSIS — Z9889 Other specified postprocedural states: Secondary | ICD-10-CM

## 2015-01-26 DIAGNOSIS — M546 Pain in thoracic spine: Secondary | ICD-10-CM

## 2015-01-26 HISTORY — PX: LUMBAR LAMINECTOMY/DECOMPRESSION MICRODISCECTOMY: SHX5026

## 2015-01-26 SURGERY — LUMBAR LAMINECTOMY/DECOMPRESSION MICRODISCECTOMY 1 LEVEL
Anesthesia: General | Site: Back | Laterality: Bilateral

## 2015-01-26 MED ORDER — ESMOLOL HCL 100 MG/10ML IV SOLN
INTRAVENOUS | Status: DC | PRN
  Administered 2015-01-26: 30 mg via INTRAVENOUS

## 2015-01-26 MED ORDER — PROPOFOL 10 MG/ML IV BOLUS
INTRAVENOUS | Status: AC
  Filled 2015-01-26: qty 20

## 2015-01-26 MED ORDER — POTASSIUM CHLORIDE IN NACL 20-0.9 MEQ/L-% IV SOLN
INTRAVENOUS | Status: DC
  Filled 2015-01-26 (×3): qty 1000

## 2015-01-26 MED ORDER — ARTIFICIAL TEARS OP OINT
TOPICAL_OINTMENT | OPHTHALMIC | Status: AC
  Filled 2015-01-26: qty 3.5

## 2015-01-26 MED ORDER — SODIUM CHLORIDE 0.9 % IJ SOLN
3.0000 mL | Freq: Two times a day (BID) | INTRAMUSCULAR | Status: DC
  Administered 2015-01-26 (×2): 3 mL via INTRAVENOUS

## 2015-01-26 MED ORDER — PROMETHAZINE HCL 25 MG/ML IJ SOLN: 6.2500 mg | INTRAMUSCULAR | Status: DC | PRN

## 2015-01-26 MED ORDER — CYCLOBENZAPRINE HCL 10 MG PO TABS
10.0000 mg | ORAL_TABLET | Freq: Three times a day (TID) | ORAL | Status: DC | PRN
  Administered 2015-01-26 – 2015-01-27 (×2): 10 mg via ORAL
  Filled 2015-01-26 (×2): qty 1

## 2015-01-26 MED ORDER — GLYCOPYRROLATE 0.2 MG/ML IJ SOLN
INTRAMUSCULAR | Status: AC
  Filled 2015-01-26: qty 1

## 2015-01-26 MED ORDER — AMLODIPINE BESYLATE 5 MG PO TABS
5.0000 mg | ORAL_TABLET | Freq: Every day | ORAL | Status: DC
  Administered 2015-01-27: 5 mg via ORAL
  Filled 2015-01-26 (×2): qty 1

## 2015-01-26 MED ORDER — PROPOFOL 10 MG/ML IV BOLUS
INTRAVENOUS | Status: DC | PRN
  Administered 2015-01-26: 200 mg via INTRAVENOUS

## 2015-01-26 MED ORDER — ROCURONIUM BROMIDE 100 MG/10ML IV SOLN
INTRAVENOUS | Status: DC | PRN
  Administered 2015-01-26: 50 mg via INTRAVENOUS

## 2015-01-26 MED ORDER — LIDOCAINE HCL (CARDIAC) 20 MG/ML IV SOLN
INTRAVENOUS | Status: AC
  Filled 2015-01-26: qty 5

## 2015-01-26 MED ORDER — SODIUM CHLORIDE 0.9 % IJ SOLN: 3.0000 mL | INTRAMUSCULAR | Status: DC | PRN

## 2015-01-26 MED ORDER — DEXAMETHASONE SODIUM PHOSPHATE 10 MG/ML IJ SOLN
INTRAMUSCULAR | Status: AC
  Filled 2015-01-26: qty 1

## 2015-01-26 MED ORDER — THROMBIN 5000 UNITS EX SOLR
CUTANEOUS | Status: DC | PRN
  Administered 2015-01-26: 10 mL via TOPICAL

## 2015-01-26 MED ORDER — ACETAMINOPHEN 650 MG RE SUPP: 650.0000 mg | RECTAL | Status: DC | PRN

## 2015-01-26 MED ORDER — ONDANSETRON HCL 4 MG/2ML IJ SOLN
INTRAMUSCULAR | Status: AC
  Filled 2015-01-26: qty 2

## 2015-01-26 MED ORDER — FENTANYL CITRATE (PF) 250 MCG/5ML IJ SOLN
INTRAMUSCULAR | Status: AC
  Filled 2015-01-26: qty 5

## 2015-01-26 MED ORDER — VANCOMYCIN HCL IN DEXTROSE 1-5 GM/200ML-% IV SOLN
1000.0000 mg | Freq: Once | INTRAVENOUS | Status: AC
  Administered 2015-01-26: 1000 mg via INTRAVENOUS
  Filled 2015-01-26 (×2): qty 200

## 2015-01-26 MED ORDER — SUFENTANIL CITRATE 50 MCG/ML IV SOLN
INTRAVENOUS | Status: AC
  Filled 2015-01-26: qty 1

## 2015-01-26 MED ORDER — OXYCODONE-ACETAMINOPHEN 5-325 MG PO TABS
1.0000 | ORAL_TABLET | ORAL | Status: DC | PRN
  Administered 2015-01-26 – 2015-01-27 (×5): 2 via ORAL
  Filled 2015-01-26 (×5): qty 2

## 2015-01-26 MED ORDER — MEPERIDINE HCL 25 MG/ML IJ SOLN: 6.2500 mg | INTRAMUSCULAR | Status: DC | PRN

## 2015-01-26 MED ORDER — VANCOMYCIN HCL 1000 MG IV SOLR
INTRAVENOUS | Status: AC
  Filled 2015-01-26: qty 1000

## 2015-01-26 MED ORDER — SENNA 8.6 MG PO TABS
1.0000 | ORAL_TABLET | Freq: Two times a day (BID) | ORAL | Status: DC
  Administered 2015-01-26: 8.6 mg via ORAL
  Filled 2015-01-26 (×2): qty 1

## 2015-01-26 MED ORDER — PHENYLEPHRINE 40 MCG/ML (10ML) SYRINGE FOR IV PUSH (FOR BLOOD PRESSURE SUPPORT)
PREFILLED_SYRINGE | INTRAVENOUS | Status: AC
  Filled 2015-01-26: qty 10

## 2015-01-26 MED ORDER — ACETAMINOPHEN 325 MG PO TABS: 650.0000 mg | ORAL_TABLET | ORAL | Status: DC | PRN

## 2015-01-26 MED ORDER — PHENOL 1.4 % MT LIQD: 1.0000 | OROMUCOSAL | Status: DC | PRN

## 2015-01-26 MED ORDER — FENTANYL CITRATE (PF) 100 MCG/2ML IJ SOLN
25.0000 ug | INTRAMUSCULAR | Status: DC | PRN
  Administered 2015-01-26: 50 ug via INTRAVENOUS
  Administered 2015-01-26 (×2): 25 ug via INTRAVENOUS

## 2015-01-26 MED ORDER — MIDAZOLAM HCL 5 MG/5ML IJ SOLN
INTRAMUSCULAR | Status: DC | PRN
  Administered 2015-01-26: 2 mg via INTRAVENOUS

## 2015-01-26 MED ORDER — ONDANSETRON HCL 4 MG/2ML IJ SOLN: 4.0000 mg | INTRAMUSCULAR | Status: DC | PRN

## 2015-01-26 MED ORDER — THROMBIN 5000 UNITS EX SOLR
CUTANEOUS | Status: DC | PRN
  Administered 2015-01-26 (×2): 5000 [IU] via TOPICAL

## 2015-01-26 MED ORDER — MENTHOL 3 MG MT LOZG: 1.0000 | LOZENGE | OROMUCOSAL | Status: DC | PRN

## 2015-01-26 MED ORDER — LACTATED RINGERS IV SOLN
INTRAVENOUS | Status: DC
  Administered 2015-01-26: 08:00:00 via INTRAVENOUS

## 2015-01-26 MED ORDER — DEXAMETHASONE SODIUM PHOSPHATE 10 MG/ML IJ SOLN
10.0000 mg | INTRAMUSCULAR | Status: AC
  Administered 2015-01-26: 10 mg via INTRAVENOUS

## 2015-01-26 MED ORDER — SODIUM CHLORIDE 0.9 % IR SOLN
Status: DC | PRN
  Administered 2015-01-26: 500 mL

## 2015-01-26 MED ORDER — BUPIVACAINE HCL (PF) 0.25 % IJ SOLN
INTRAMUSCULAR | Status: DC | PRN
  Administered 2015-01-26: 10 mL

## 2015-01-26 MED ORDER — ONDANSETRON HCL 4 MG/2ML IJ SOLN
INTRAMUSCULAR | Status: DC | PRN
  Administered 2015-01-26: 4 mg via INTRAVENOUS

## 2015-01-26 MED ORDER — HEMOSTATIC AGENTS (NO CHARGE) OPTIME
TOPICAL | Status: DC | PRN
  Administered 2015-01-26: 1 via TOPICAL

## 2015-01-26 MED ORDER — VANCOMYCIN HCL 1000 MG IV SOLR
INTRAVENOUS | Status: DC | PRN
  Administered 2015-01-26: 1000 mg

## 2015-01-26 MED ORDER — FENTANYL CITRATE (PF) 100 MCG/2ML IJ SOLN
INTRAMUSCULAR | Status: AC
  Filled 2015-01-26: qty 2

## 2015-01-26 MED ORDER — SUGAMMADEX SODIUM 200 MG/2ML IV SOLN
INTRAVENOUS | Status: DC | PRN
Start: 2015-01-26 — End: 2015-01-26
  Administered 2015-01-26: 213.2 mg via INTRAVENOUS

## 2015-01-26 MED ORDER — MORPHINE SULFATE (PF) 2 MG/ML IV SOLN: 1.0000 mg | INTRAVENOUS | Status: DC | PRN

## 2015-01-26 MED ORDER — ROCURONIUM BROMIDE 50 MG/5ML IV SOLN
INTRAVENOUS | Status: AC
  Filled 2015-01-26: qty 2

## 2015-01-26 MED ORDER — MIDAZOLAM HCL 2 MG/2ML IJ SOLN
INTRAMUSCULAR | Status: AC
  Filled 2015-01-26: qty 2

## 2015-01-26 MED ORDER — LIDOCAINE HCL (CARDIAC) 20 MG/ML IV SOLN
INTRAVENOUS | Status: DC | PRN
  Administered 2015-01-26: 50 mg via INTRAVENOUS

## 2015-01-26 MED ORDER — FENTANYL CITRATE (PF) 100 MCG/2ML IJ SOLN
INTRAMUSCULAR | Status: DC | PRN
  Administered 2015-01-26: 250 ug via INTRAVENOUS

## 2015-01-26 MED ORDER — 0.9 % SODIUM CHLORIDE (POUR BTL) OPTIME
TOPICAL | Status: DC | PRN
  Administered 2015-01-26: 1000 mL

## 2015-01-26 MED ORDER — SUFENTANIL CITRATE 50 MCG/ML IV SOLN
INTRAVENOUS | Status: DC | PRN
  Administered 2015-01-26 (×2): 10 ug via INTRAVENOUS

## 2015-01-26 MED ORDER — SUGAMMADEX SODIUM 500 MG/5ML IV SOLN
INTRAVENOUS | Status: AC
  Filled 2015-01-26: qty 5

## 2015-01-26 MED ORDER — LACTATED RINGERS IV SOLN: INTRAVENOUS | Status: DC

## 2015-01-26 MED ORDER — LACTATED RINGERS IV SOLN
INTRAVENOUS | Status: DC | PRN
  Administered 2015-01-26 (×2): via INTRAVENOUS

## 2015-01-26 SURGICAL SUPPLY — 38 items
BAG DECANTER FOR FLEXI CONT (MISCELLANEOUS) ×2 IMPLANT
BENZOIN TINCTURE PRP APPL 2/3 (GAUZE/BANDAGES/DRESSINGS) ×2 IMPLANT
BUR MATCHSTICK NEURO 3.0 LAGG (BURR) ×2 IMPLANT
CANISTER SUCT 3000ML PPV (MISCELLANEOUS) ×2 IMPLANT
DRAPE LAPAROTOMY 100X72X124 (DRAPES) ×2 IMPLANT
DRAPE MICROSCOPE LEICA (MISCELLANEOUS) ×2 IMPLANT
DRAPE POUCH INSTRU U-SHP 10X18 (DRAPES) ×2 IMPLANT
DRAPE SURG 17X23 STRL (DRAPES) ×2 IMPLANT
DRSG AQUACEL AG ADV 3.5X 4 (GAUZE/BANDAGES/DRESSINGS) ×2 IMPLANT
DURAPREP 26ML APPLICATOR (WOUND CARE) ×2 IMPLANT
ELECT REM PT RETURN 9FT ADLT (ELECTROSURGICAL) ×2
ELECTRODE REM PT RTRN 9FT ADLT (ELECTROSURGICAL) ×1 IMPLANT
GAUZE SPONGE 4X4 16PLY XRAY LF (GAUZE/BANDAGES/DRESSINGS) IMPLANT
GLOVE BIO SURGEON STRL SZ8 (GLOVE) ×2 IMPLANT
GOWN STRL REUS W/ TWL LRG LVL3 (GOWN DISPOSABLE) IMPLANT
GOWN STRL REUS W/ TWL XL LVL3 (GOWN DISPOSABLE) ×1 IMPLANT
GOWN STRL REUS W/TWL 2XL LVL3 (GOWN DISPOSABLE) IMPLANT
GOWN STRL REUS W/TWL LRG LVL3 (GOWN DISPOSABLE)
GOWN STRL REUS W/TWL XL LVL3 (GOWN DISPOSABLE) ×1
HEMOSTAT POWDER KIT SURGIFOAM (HEMOSTASIS) IMPLANT
KIT BASIN OR (CUSTOM PROCEDURE TRAY) ×2 IMPLANT
KIT ROOM TURNOVER OR (KITS) ×2 IMPLANT
NEEDLE HYPO 25X1 1.5 SAFETY (NEEDLE) ×2 IMPLANT
NEEDLE SPNL 20GX3.5 QUINCKE YW (NEEDLE) IMPLANT
NS IRRIG 1000ML POUR BTL (IV SOLUTION) ×2 IMPLANT
PACK LAMINECTOMY NEURO (CUSTOM PROCEDURE TRAY) ×2 IMPLANT
PAD ARMBOARD 7.5X6 YLW CONV (MISCELLANEOUS) ×6 IMPLANT
RUBBERBAND STERILE (MISCELLANEOUS) ×4 IMPLANT
SPONGE SURGIFOAM ABS GEL SZ50 (HEMOSTASIS) ×2 IMPLANT
STRIP CLOSURE SKIN 1/2X4 (GAUZE/BANDAGES/DRESSINGS) ×2 IMPLANT
SUT VIC AB 0 CT1 18XCR BRD8 (SUTURE) ×1 IMPLANT
SUT VIC AB 0 CT1 8-18 (SUTURE) ×1
SUT VIC AB 2-0 CP2 18 (SUTURE) ×2 IMPLANT
SUT VIC AB 3-0 SH 8-18 (SUTURE) ×2 IMPLANT
TAPE STRIPS DRAPE STRL (GAUZE/BANDAGES/DRESSINGS) ×2 IMPLANT
TOWEL OR 17X24 6PK STRL BLUE (TOWEL DISPOSABLE) ×2 IMPLANT
TOWEL OR 17X26 10 PK STRL BLUE (TOWEL DISPOSABLE) ×2 IMPLANT
WATER STERILE IRR 1000ML POUR (IV SOLUTION) ×2 IMPLANT

## 2015-01-26 NOTE — Transfer of Care (Signed)
Immediate Anesthesia Transfer of Care Note  Patient: Larry Houston  Procedure(s) Performed: Procedure(s) with comments: Laminectomy and Foraminotomy - L4-L5 - bilateral (Bilateral) - Laminectomy and Foraminotomy - L4-L5 - bilateral  Patient Location: PACU  Anesthesia Type:General  Level of Consciousness: awake, alert  and oriented  Airway & Oxygen Therapy: Patient Spontanous Breathing and Patient connected to nasal cannula oxygen  Post-op Assessment: Report given to RN and Post -op Vital signs reviewed and stable  Post vital signs: Reviewed and stable  Last Vitals:  Filed Vitals:   01/26/15 0732 01/26/15 1136  BP: 152/81 105/72  Pulse:    Temp:  36.5 C  Resp:  25    Complications: No apparent anesthesia complications

## 2015-01-26 NOTE — Op Note (Signed)
01/26/2015  11:30 AM  PATIENT:  Larry Houston  60 y.o. 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 Conchita Paris  ANESTHESIA:   General  EBL: Less than 100 ml  Total I/O In: 1000 [I.V.:1000] Out: -   BLOOD ADMINISTERED:none  DRAINS: None   SPECIMEN:  No Specimen  INDICATION FOR PROCEDURE: This is a very pleasant gentleman 60 year old gentleman who presented with bilateral leg pain that was progressive. I should spinal stenosis at L4-5. Intraoperative management required long time including steroid injections without relief. Recommended decompressive bilateral laminectomies. Patient understood the risks, benefits, and alternatives and potential outcomes and wished to proceed.  PROCEDURE DETAILS: The patient was taken to the operating room and after induction of adequate generalized endotracheal anesthesia, the patient was rolled into the prone position on the Wilson frame and all pressure points were padded. The lumbar region was cleaned and then prepped with DuraPrep and draped in the usual sterile fashion. 5 cc of local anesthesia was injected and then a dorsal midline incision was made and carried down to the lumbo sacral fascia. The fascia was opened and the paraspinous musculature was taken down in a subperiosteal fashion to expose L4-5 bilaterally. Intraoperative x-ray confirmed my level, and then I used a combination of the high-speed drill and the Kerrison punches to perform a hemilaminectomy, medial facetectomy, and foraminotomy at L4-5 bilaterally. The underlying yellow ligament was opened and removed in a piecemeal fashion to expose the underlying dura and exiting nerve root. I undercut the lateral recess and dissected down until I was medial to and distal to the pedicle. The nerve root  was well decompressed. I then palpated with a coronary dilator along the nerve root and into the foramen to assure adequate decompression. I felt no more compression of the nerve root. I retracted the nerve root medially and inspected the disc space and found no significant herniation on either side. I irrigated with saline solution containing bacitracin. Achieved hemostasis with bipolar cautery, lined the dura with Gelfoam, and then closed the fascia with 0 Vicryl. I closed the subcutaneous tissues with 2-0 Vicryl and the subcuticular tissues with 3-0 Vicryl. The skin was then closed with benzoin and Steri-Strips. The drapes were removed, a sterile dressing was applied. The patient was awakened from general anesthesia and transferred to the recovery room in stable condition. At the end of the procedure all sponge, needle and instrument counts were correct.   PLAN OF CARE: Admit for overnight observation  PATIENT DISPOSITION:  PACU - hemodynamically stable.   Delay start of Pharmacological VTE agent (>24hrs) due to surgical blood loss or risk of bleeding:  yes

## 2015-01-26 NOTE — Anesthesia Procedure Notes (Signed)
Procedure Name: Intubation Date/Time: 01/26/2015 9:47 AM Performed by: Eligha Bridegroom Pre-anesthesia Checklist: Patient identified, Patient being monitored, Emergency Drugs available, Timeout performed and Suction available Patient Re-evaluated:Patient Re-evaluated prior to inductionOxygen Delivery Method: Circle system utilized Preoxygenation: Pre-oxygenation with 100% oxygen Intubation Type: IV induction Ventilation: Mask ventilation without difficulty and Oral airway inserted - appropriate to patient size Laryngoscope Size: Mac and 4 Grade View: Grade I Tube type: Oral Tube size: 8.0 mm Number of attempts: 1 Airway Equipment and Method: Stylet and LTA kit utilized Placement Confirmation: ETT inserted through vocal cords under direct vision,  breath sounds checked- equal and bilateral and positive ETCO2 Secured at: 22 cm Tube secured with: Tape Dental Injury: Teeth and Oropharynx as per pre-operative assessment

## 2015-01-26 NOTE — H&P (Signed)
Subjective: Patient is a 60 y.o. male admitted for LL. Onset of symptoms was several months ago, gradually worsening since that time.  The pain is rated severe, and is located at the across the lower back and radiates to legs. The pain is described as aching and occurs intermittently. The symptoms have been progressive. Symptoms are exacerbated by exercise. MRI or CT showed stenosis L4-5   Past Medical History  Diagnosis Date  . Hypertension   . Arthritis     lumbar stenosis     Past Surgical History  Procedure Laterality Date  . Neck surgery  1992    posterior fusion- cervical   . Nasal endoscopy with epistaxis control N/A 04/04/2014    Procedure: NASAL ENDOSCOPY WITH EPISTAXIS CONTROL;  Surgeon: Melvenia Beam, MD;  Location: [facility] OR;  Service: ENT;  Laterality: N/A;    Prior to Admission medications   Medication Sig Start Date End Date Taking? Authorizing Provider  amLODipine (NORVASC) 5 MG tablet Take 5 mg by mouth daily before breakfast.    Yes Historical Provider, MD  econazole nitrate 1 % cream Apply 1 application topically daily as needed. 12/27/14  Yes Historical Provider, MD  oxyCODONE-acetaminophen (PERCOCET/ROXICET) 5-325 MG tablet Take 1 tablet by mouth every 4 (four) hours as needed for moderate pain.  01/04/15  Yes Historical Provider, MD  acetaminophen (TYLENOL) 500 MG tablet Take 500 mg by mouth every 6 (six) hours as needed for mild pain.    Historical Provider, MD   Allergies  Allergen Reactions  . Penicillins Hives    Social History  Substance Use Topics  . Smoking status: Former Smoker    Quit date: 01/13/2006  . Smokeless tobacco: Not on file  . Alcohol Use: Yes     Comment: occasional     History reviewed. No pertinent family history.   Review of Systems  Positive ROS: neg  All other systems have been reviewed and were otherwise negative with the exception of those mentioned in the HPI and as above.  Objective: Vital signs in last 24 hours: Temp:   [97.6 F (36.4 C)] 97.6 F (36.4 C) (01/18 0731) Pulse Rate:  [84] 84 (01/18 0731) Resp:  [20] 20 (01/18 0731) BP: (152)/(81) 152/81 mmHg (01/18 0732) SpO2:  [98 %] 98 % (01/18 0731) Weight:  [106.595 kg (235 lb)] 106.595 kg (235 lb) (01/18 0731)  General Appearance: Alert, cooperative, no distress, appears stated age Head: Normocephalic, without obvious abnormality, atraumatic Eyes: PERRL, conjunctiva/corneas clear, EOM's intact    Neck: Supple, symmetrical, trachea midline Back: Symmetric, no curvature, ROM normal, no CVA tenderness Lungs:  respirations unlabored Heart: Regular rate and rhythm Abdomen: Soft, non-tender Extremities: Extremities normal, atraumatic, no cyanosis or edema Pulses: 2+ and symmetric all extremities Skin: Skin color, texture, turgor normal, no rashes or lesions  NEUROLOGIC:   Mental status: Alert and oriented x4,  no aphasia, good attention span, fund of knowledge, and memory Motor Exam - grossly normal Sensory Exam - grossly normal Reflexes: 1+ Coordination - grossly normal Gait - grossly normal Balance - grossly normal Cranial Nerves: I: smell Not tested  II: visual acuity  OS: nl    OD: nl  II: visual fields Full to confrontation  II: pupils Equal, round, reactive to light  III,VII: ptosis None  III,IV,VI: extraocular muscles  Full ROM  V: mastication Normal  V: facial light touch sensation  Normal  V,VII: corneal reflex  Present  VII: facial muscle function - upper  Normal  VII: facial  muscle function - lower Normal  VIII: hearing Not tested  IX: soft palate elevation  Normal  IX,X: gag reflex Present  XI: trapezius strength  5/5  XI: sternocleidomastoid strength 5/5  XI: neck flexion strength  5/5  XII: tongue strength  Normal    Data Review Lab Results  Component Value Date   WBC 5.1 01/14/2015   HGB 16.1 01/14/2015   HCT 46.7 01/14/2015   MCV 95.7 01/14/2015   PLT 117* 01/14/2015   Lab Results  Component Value Date   NA  138 01/14/2015   K 4.8 01/14/2015   CL 101 01/14/2015   CO2 27 01/14/2015   BUN 11 01/14/2015   CREATININE 0.96 01/14/2015   GLUCOSE 123* 01/14/2015   Lab Results  Component Value Date   INR 1.04 01/14/2015    Assessment/Plan: Patient admitted for LL L4-5. Patient has failed a reasonable attempt at conservative therapy.  I explained the condition and procedure to the patient and answered any questions.  Patient wishes to proceed with procedure as planned. Understands risks/ benefits and typical outcomes of procedure.   Mardel Grudzien S 01/26/2015 9:12 AM

## 2015-01-26 NOTE — Anesthesia Preprocedure Evaluation (Addendum)
Anesthesia Evaluation  Patient identified by MRN, date of birth, ID band Patient awake    Reviewed: Allergy & Precautions, NPO status , Patient's Chart, lab work & pertinent test results  Airway Mallampati: II  TM Distance: >3 FB Neck ROM: Full    Dental no notable dental hx. (+) Upper Dentures, Partial Lower   Pulmonary former smoker,    Pulmonary exam normal breath sounds clear to auscultation       Cardiovascular hypertension, Pt. on medications Normal cardiovascular exam Rhythm:Regular Rate:Normal     Neuro/Psych negative neurological ROS  negative psych ROS   GI/Hepatic negative GI ROS, Neg liver ROS,   Endo/Other  negative endocrine ROS  Renal/GU negative Renal ROS  negative genitourinary   Musculoskeletal negative musculoskeletal ROS (+)   Abdominal   Peds negative pediatric ROS (+)  Hematology negative hematology ROS (+)   Anesthesia Other Findings   Reproductive/Obstetrics negative OB ROS                          Anesthesia Physical Anesthesia Plan  ASA: II  Anesthesia Plan: General   Post-op Pain Management:    Induction: Intravenous  Airway Management Planned: Oral ETT  Additional Equipment:   Intra-op Plan:   Post-operative Plan: Extubation in OR  Informed Consent: I have reviewed the patients History and Physical, chart, labs and discussed the procedure including the risks, benefits and alternatives for the proposed anesthesia with the patient or authorized representative who has indicated his/her understanding and acceptance.   Dental advisory given  Plan Discussed with: CRNA  Anesthesia Plan Comments:        Anesthesia Quick Evaluation

## 2015-01-26 NOTE — Anesthesia Postprocedure Evaluation (Signed)
Anesthesia Post Note  Patient: Larry Houston  Procedure(s) Performed: Procedure(s) (LRB): Laminectomy and Foraminotomy - L4-L5 - bilateral (Bilateral)  Patient location during evaluation: PACU Anesthesia Type: General Level of consciousness: awake and alert Pain management: pain level controlled Vital Signs Assessment: post-procedure vital signs reviewed and stable Respiratory status: spontaneous breathing, nonlabored ventilation, respiratory function stable and patient connected to nasal cannula oxygen Cardiovascular status: blood pressure returned to baseline and stable Postop Assessment: no signs of nausea or vomiting Anesthetic complications: no    Last Vitals:  Filed Vitals:   01/26/15 1145 01/26/15 1200  BP:    Pulse: 84 93  Temp:    Resp: 15 17    Last Pain:  Filed Vitals:   01/26/15 1212  PainSc: 8                  Phillips Grout

## 2015-01-27 ENCOUNTER — Encounter (HOSPITAL_COMMUNITY): Payer: Self-pay | Admitting: Neurological Surgery

## 2015-01-27 DIAGNOSIS — Z880 Allergy status to penicillin: Secondary | ICD-10-CM | POA: Diagnosis not present

## 2015-01-27 DIAGNOSIS — Z87891 Personal history of nicotine dependence: Secondary | ICD-10-CM | POA: Diagnosis not present

## 2015-01-27 DIAGNOSIS — M4806 Spinal stenosis, lumbar region: Secondary | ICD-10-CM | POA: Diagnosis not present

## 2015-01-27 DIAGNOSIS — M1990 Unspecified osteoarthritis, unspecified site: Secondary | ICD-10-CM | POA: Diagnosis not present

## 2015-01-27 DIAGNOSIS — I10 Essential (primary) hypertension: Secondary | ICD-10-CM | POA: Diagnosis not present

## 2015-01-27 MED ORDER — CYCLOBENZAPRINE HCL 10 MG PO TABS: 10.0000 mg | ORAL_TABLET | Freq: Three times a day (TID) | ORAL | Status: DC | PRN

## 2015-01-27 MED ORDER — OXYCODONE-ACETAMINOPHEN 5-325 MG PO TABS: 1.0000 | ORAL_TABLET | ORAL | Status: DC | PRN

## 2015-01-27 MED FILL — CYCLOBENZAPRINE 10 MG TAB: 10 | 17 days supply | Qty: 50 | Fill #0

## 2015-01-27 MED FILL — OXYCODONE/APAP 5-325: 5-325 | 10 days supply | Qty: 60 | Fill #0

## 2015-01-27 NOTE — Discharge Summary (Signed)
Physician Discharge Summary  Patient ID: Larry Houston MRN: [id number] DOB/AGE: 1955-04-13 60 y.o.  Admit date: 01/26/2015 Discharge date: 01/27/2015  Admission Diagnoses: lumbar stenosis   Discharge Diagnoses: same   Discharged Condition: good  Hospital Course: The patient was admitted on 01/26/2015 and taken to the operating room where the patient underwent LL L4-5. The patient tolerated the procedure well and was taken to the recovery room and then to the floor in stable condition. The hospital course was routine. There were no complications. The wound remained clean dry and intact. Pt had appropriate back soreness. No complaints of leg pain or new N/T/W. The patient remained afebrile with stable vital signs, and tolerated a regular diet. The patient continued to increase activities, and pain was well controlled with oral pain medications.   Consults: None  Significant Diagnostic Studies:  Results for orders placed or performed during the hospital encounter of 01/14/15  Surgical pcr screen  Result Value Ref Range   MRSA, PCR NEGATIVE NEGATIVE   Staphylococcus aureus POSITIVE (A) NEGATIVE  Basic metabolic panel  Result Value Ref Range   Sodium 138 135 - 145 mmol/L   Potassium 4.8 3.5 - 5.1 mmol/L   Chloride 101 101 - 111 mmol/L   CO2 27 22 - 32 mmol/L   Glucose, Bld 123 (H) 65 - 99 mg/dL   BUN 11 6 - 20 mg/dL   Creatinine, Ser 4.09 0.61 - 1.24 mg/dL   Calcium 9.5 8.9 - 81.1 mg/dL   GFR calc non Af Amer >60 >60 mL/min   GFR calc Af Amer >60 >60 mL/min   Anion gap 10 5 - 15  CBC WITH DIFFERENTIAL  Result Value Ref Range   WBC 5.1 4.0 - 10.5 K/uL   RBC 4.88 4.22 - 5.81 MIL/uL   Hemoglobin 16.1 13.0 - 17.0 g/dL   HCT 91.4 78.2 - 95.6 %   MCV 95.7 78.0 - 100.0 fL   MCH 33.0 26.0 - 34.0 pg   MCHC 34.5 30.0 - 36.0 g/dL   RDW 21.3 08.6 - 57.8 %   Platelets 117 (L) 150 - 400 K/uL   Neutrophils Relative % 53 %   Neutro Abs 2.7 1.7 - 7.7 K/uL   Lymphocytes Relative 37 %    Lymphs Abs 1.9 0.7 - 4.0 K/uL   Monocytes Relative 7 %   Monocytes Absolute 0.4 0.1 - 1.0 K/uL   Eosinophils Relative 3 %   Eosinophils Absolute 0.2 0.0 - 0.7 K/uL   Basophils Relative 0 %   Basophils Absolute 0.0 0.0 - 0.1 K/uL  Protime-INR  Result Value Ref Range   Prothrombin Time 13.8 11.6 - 15.2 seconds   INR 1.04 0.00 - 1.49    Chest 2 View  01/14/2015  CLINICAL DATA:  Preoperative examination prior to lumbar surgery planned for January 26, 2015; history of hypertension and previous tobacco use. EXAM: CHEST  2 VIEW COMPARISON:  None in PACs FINDINGS: The lungs are adequately inflated. The interstitial markings are mildly prominent. There is no alveolar infiltrate. There is no pleural effusion nipple shadows are visible bilaterally. The heart and pulmonary vascularity are normal. The mediastinum is normal in width. There is no pleural effusion. The bony thorax is unremarkable. IMPRESSION: Mild interstitial prominence likely reflects the patient's smoking history. There is no active cardiopulmonary disease. Electronically Signed   By: Tanyah Debruyne  [place] M.D.   On: 01/14/2015 15:11   Dg Lumbar Spine 1 View  01/26/2015  CLINICAL DATA:  L4-5 laminectomy and  foraminotomy EXAM: LUMBAR SPINE - 1 VIEW COMPARISON:  MRI 08/04/2014 FINDINGS: Single cross-table lateral view of the lumbar spine demonstrates posterior surgical instrument directed at the level of the L5 pedicles. IMPRESSION: Intraoperative localization as above. Electronically Signed   By: Charlett Nose M.D.   On: 01/26/2015 11:19    Antibiotics:  Anti-infectives    Start     Dose/Rate Route Frequency Ordered Stop   01/26/15 2130  vancomycin (VANCOCIN) IVPB 1000 mg/200 mL premix     1,000 mg 200 mL/hr over 60 Minutes Intravenous  Once 01/26/15 1339 01/27/15 0016   01/26/15 1044  vancomycin (VANCOCIN) powder  Status:  Discontinued       As needed 01/26/15 1044 01/26/15 1143   01/26/15 1031  vancomycin (VANCOCIN) 1000 MG powder     Comments:  Jeralyn Ruths   : cabinet override      01/26/15 1031 01/26/15 2244   01/26/15 0938  bacitracin 50,000 Units in sodium chloride irrigation 0.9 % 500 mL irrigation  Status:  Discontinued       As needed 01/26/15 0939 01/26/15 1143   01/26/15 0600  vancomycin (VANCOCIN) 1,500 mg in sodium chloride 0.9 % 500 mL IVPB     1,500 mg 250 mL/hr over 120 Minutes Intravenous On call to O.R. 01/25/15 1246 01/26/15 1015      Discharge Exam: Blood pressure 154/96, pulse 81, temperature 98.1 F (36.7 C), temperature source Oral, resp. rate 18, weight 106.595 kg (235 lb), SpO2 98 %. Neurologic: Grossly normal Dressing dry  Discharge Medications:     Medication List    TAKE these medications        acetaminophen 500 MG tablet  Commonly known as:  TYLENOL  Take 500 mg by mouth every 6 (six) hours as needed for mild pain.     amLODipine 5 MG tablet  Commonly known as:  NORVASC  Take 5 mg by mouth daily before breakfast.     cyclobenzaprine 10 MG tablet  Commonly known as:  FLEXERIL  Take 1 tablet (10 mg total) by mouth 3 (three) times daily as needed for muscle spasms.     econazole nitrate 1 % cream  Apply 1 application topically daily as needed.     oxyCODONE-acetaminophen 5-325 MG tablet  Commonly known as:  PERCOCET/ROXICET  Take 1 tablet by mouth every 4 (four) hours as needed for moderate pain.        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      Discharge Instructions     Remove dressing in 72 hours    Complete by:  As directed      Call MD for:  difficulty breathing, headache or visual disturbances    Complete by:  As directed      Call MD for:  persistant nausea and vomiting    Complete by:  As directed      Call MD for:  redness, tenderness, or signs of infection (pain, swelling, redness, odor or green/yellow discharge around incision site)    Complete by:  As directed      Call MD for:  severe uncontrolled pain    Complete by:  As directed      Call  MD for:  temperature >100.4    Complete by:  As directed      Diet - low sodium heart healthy    Complete by:  As directed      Discharge instructions    Complete by:  As directed  No strenuous activity, may shower     Increase activity slowly    Complete by:  As directed            Follow-up Information    Follow up with Janilah Hojnacki S, MD. Schedule an appointment as soon as possible for a visit in 3 weeks.   Specialty:  Neurosurgery   Contact information:   [address] [place] [address] [phone]        Signed: Tia Alert 01/27/2015, 9:42 AM

## 2015-01-27 NOTE — Progress Notes (Signed)
Patient alert and oriented, mae's well, voiding adequate amount of urine, swallowing without difficulty, c/o moderate pain and meds given prior to discharged. Patient discharged home with family. Script and discharged instructions given to patient. Patient and family stated understanding of instructions given.  

## 2015-02-15 MED FILL — tiZANidine HCL 4 MG TABS: 4 | 20 days supply | Qty: 60 | Fill #0

## 2015-02-16 MED FILL — OXYCODONE/APAP 5-325: 5-325 | 15 days supply | Qty: 60 | Fill #0

## 2015-02-17 ENCOUNTER — Emergency Department (HOSPITAL_COMMUNITY)
Admission: EM | Admit: 2015-02-17 | Discharge: 2015-02-17 | Disposition: A | Discharge status: Home / Self Care | Payer: 59 | Source: Home / Self Care | Attending: Family Medicine | Admitting: Family Medicine

## 2015-02-17 ENCOUNTER — Encounter (HOSPITAL_COMMUNITY): Payer: Self-pay | Admitting: Emergency Medicine

## 2015-02-17 DIAGNOSIS — L02214 Cutaneous abscess of groin: Secondary | ICD-10-CM

## 2015-02-17 MED ORDER — SULFAMETHOXAZOLE-TRIMETHOPRIM 800-160 MG PO TABS: 1.0000 | ORAL_TABLET | Freq: Two times a day (BID) | ORAL | Status: DC

## 2015-02-17 MED ORDER — HYDROCODONE-ACETAMINOPHEN 5-325 MG PO TABS: 2.0000 | ORAL_TABLET | ORAL | Status: DC | PRN

## 2015-02-17 MED ORDER — LIDOCAINE HCL 2 % IJ SOLN
INTRAMUSCULAR | Status: AC
  Filled 2015-02-17: qty 20

## 2015-02-17 MED FILL — SULFAMETHOXAZOLE/TMP DS TAB: 800-160 | 7 days supply | Qty: 14 | Fill #0

## 2015-02-17 NOTE — ED Notes (Signed)
Patient reports a history of having abscess'.  Patient reports this is the largest one he has had.  Patient reports this abscess is in groin area.  Patient reports having back surgery 3 weeks ago.

## 2015-02-17 NOTE — ED Provider Notes (Signed)
CSN: [id number]     Arrival date & time 02/17/15  1309 History   First MD Initiated Contact with Patient 02/17/15 1403     Chief Complaint  Patient presents with  . Abscess   (Consider location/radiation/quality/duration/timing/severity/associated sxs/prior Treatment) Patient is a 60 y.o. male presenting with abscess. The history is provided by the patient. No language interpreter was used.  Abscess Location:  Torso and leg Leg abscess location:  L lower leg Abscess quality: draining, painful, redness and warmth   Red streaking: no   Progression:  Worsening Pain details:    Quality:  No pain   Timing:  Constant   Progression:  Worsening Chronicity:  New Relieved by:  Nothing Worsened by:  Nothing tried Ineffective treatments:  None tried Associated symptoms: no fever   Pt complains of pain in his left groin.   Past Medical History  Diagnosis Date  . Hypertension   . Arthritis     lumbar stenosis    Past Surgical History  Procedure Laterality Date  . Neck surgery  1992    posterior fusion- cervical   . Nasal endoscopy with epistaxis control N/A 04/04/2014    Procedure: NASAL ENDOSCOPY WITH EPISTAXIS CONTROL;  Surgeon: Melvenia Beam, MD;  Location: [facility] OR;  Service: ENT;  Laterality: N/A;  . Lumbar laminectomy/decompression microdiscectomy Bilateral 01/26/2015    Procedure: Laminectomy and Foraminotomy - L4-L5 - bilateral;  Surgeon: Tia Alert, MD;  Location: [facility];  Service: Neurosurgery;  Laterality: Bilateral;  Laminectomy and Foraminotomy - L4-L5 - bilateral   No family history on file. Social History  Substance Use Topics  . Smoking status: Former Smoker    Quit date: 01/13/2006  . Smokeless tobacco: None  . Alcohol Use: Yes     Comment: occasional     Review of Systems  Constitutional: Negative for fever.  All other systems reviewed and are negative.   Allergies  Penicillins  Home Medications   Prior to Admission medications   Medication Sig  Start Date End Date Taking? Authorizing Provider  acetaminophen (TYLENOL) 500 MG tablet Take 500 mg by mouth every 6 (six) hours as needed for mild pain.    Historical Provider, MD  amLODipine (NORVASC) 5 MG tablet Take 5 mg by mouth daily before breakfast.     Historical Provider, MD  cyclobenzaprine (FLEXERIL) 10 MG tablet Take 1 tablet (10 mg total) by mouth 3 (three) times daily as needed for muscle spasms. 01/27/15   Tia Alert, MD  econazole nitrate 1 % cream Apply 1 application topically daily as needed. 12/27/14   Historical Provider, MD  HYDROcodone-acetaminophen (NORCO/VICODIN) 5-325 MG tablet Take 2 tablets by mouth every 4 (four) hours as needed. 02/17/15   Elson Areas, PA-C  oxyCODONE-acetaminophen (PERCOCET/ROXICET) 5-325 MG tablet Take 1 tablet by mouth every 4 (four) hours as needed for moderate pain. 01/27/15   Tia Alert, MD  sulfamethoxazole-trimethoprim (BACTRIM DS,SEPTRA DS) 800-160 MG tablet Take 1 tablet by mouth 2 (two) times daily. 02/17/15 02/24/15  Elson Areas, PA-C   Meds Ordered and Administered this Visit  Medications - No data to display  BP 159/93 mmHg  Pulse 106  Temp(Src) 98.4 F (36.9 C) (Oral)  Resp 16  SpO2 99% No data found.   Physical Exam  Constitutional: He is oriented to person, place, and time. He appears well-developed and well-nourished.  HENT:  Head: Normocephalic.  Musculoskeletal: He exhibits edema and tenderness.  Swollen area left groin,  Painful to  touch, fluctuant  Neurological: He is alert and oriented to person, place, and time. He has normal reflexes.  Skin: Skin is warm.  Psychiatric: He has a normal mood and affect.  Nursing note and vitals reviewed.   ED Course  .Marland KitchenIncision and Drainage Date/Time: 02/17/2015 8:38 PM Performed by: Elson Areas Authorized by: Barbaraann Barthel Consent: Verbal consent obtained. Consent given by: patient Patient identity confirmed: verbally with patient Type: abscess Body area: lower  extremity Anesthesia: local infiltration Local anesthetic: lidocaine 2% with epinephrine Scalpel size: 11 Incision type: single straight Complexity: simple Drainage: purulent Drainage amount: copious Wound treatment: drain placed Packing material: 1/4 in iodoform gauze Patient tolerance: Patient tolerated the procedure well with no immediate complications   (including critical care time)  Labs Review Labs Reviewed - No data to display  Imaging Review No results found.   Visual Acuity Review  Right Eye Distance:   Left Eye Distance:   Bilateral Distance:    Right Eye Near:   Left Eye Near:    Bilateral Near:         MDM   1. Abscess of left groin    Meds ordered this encounter  Medications  . sulfamethoxazole-trimethoprim (BACTRIM DS,SEPTRA DS) 800-160 MG tablet    Sig: Take 1 tablet by mouth 2 (two) times daily.    Dispense:  14 tablet    Refill:  0    Order Specific Question:  Supervising Provider    Answer:  Linna Hoff [phone]  . HYDROcodone-acetaminophen (NORCO/VICODIN) 5-325 MG tablet    Sig: Take 2 tablets by mouth every 4 (four) hours as needed.    Dispense:  10 tablet    Refill:  0    Order Specific Question:  Supervising Provider    Answer:  Linna Hoff [phone]  An After Visit Summary was printed and given to the patient. Return in 2 days for wound recheck.   Lonia Skinner [place], PA-C 02/17/15 2041

## 2015-02-17 NOTE — Discharge Instructions (Signed)
Abscess °An abscess is an infected area that contains a collection of pus and debris. It can occur in almost any part of the body. An abscess is also known as a furuncle or boil. °CAUSES  °An abscess occurs when tissue gets infected. This can occur from blockage of oil or sweat glands, infection of hair follicles, or a minor injury to the skin. As the body tries to fight the infection, pus collects in the area and creates pressure under the skin. This pressure causes pain. People with weakened immune systems have difficulty fighting infections and get certain abscesses more often.  °SYMPTOMS °Usually an abscess develops on the skin and becomes a painful mass that is red, warm, and tender. If the abscess forms under the skin, you may feel a moveable soft area under the skin. Some abscesses break open (rupture) on their own, but most will continue to get worse without care. The infection can spread deeper into the body and eventually into the bloodstream, causing you to feel ill.  °DIAGNOSIS  °Your caregiver will take your medical history and perform a physical exam. A sample of fluid may also be taken from the abscess to determine what is causing your infection. °TREATMENT  °Your caregiver may prescribe antibiotic medicines to fight the infection. However, taking antibiotics alone usually does not cure an abscess. Your caregiver may need to make a small cut (incision) in the abscess to drain the pus. In some cases, gauze is packed into the abscess to reduce pain and to continue draining the area. °HOME CARE INSTRUCTIONS  °· Only take over-the-counter or prescription medicines for pain, discomfort, or fever as directed by your caregiver. °· If you were prescribed antibiotics, take them as directed. Finish them even if you start to feel better. °· If gauze is used, follow your caregiver's directions for changing the gauze. °· To avoid spreading the infection: °· Keep your draining abscess covered with a  bandage. °· Wash your hands well. °· Do not share personal care items, towels, or whirlpools with others. °· Avoid skin contact with others. °· Keep your skin and clothes clean around the abscess. °· Keep all follow-up appointments as directed by your caregiver. °SEEK MEDICAL CARE IF:  °· You have increased pain, swelling, redness, fluid drainage, or bleeding. °· You have muscle aches, chills, or a general ill feeling. °· You have a fever. °MAKE SURE YOU:  °· Understand these instructions. °· Will watch your condition. °· Will get help right away if you are not doing well or get worse. °  °This information is not intended to replace advice given to you by your health care provider. Make sure you discuss any questions you have with your health care provider. °  °Document Released: 10/04/2004 Document Revised: 06/26/2011 Document Reviewed: 03/09/2011 °Elsevier Interactive Patient Education ©2016 [employer] ° °Incision and Drainage °Incision and drainage is a procedure in which a sac-like structure (cystic structure) is opened and drained. The area to be drained usually contains material such as pus, fluid, or blood.  °LET YOUR CAREGIVER KNOW ABOUT:  °· Allergies to medicine. °· Medicines taken, including vitamins, herbs, eyedrops, over-the-counter medicines, and creams. °· Use of steroids (by mouth or creams). °· Previous problems with anesthetics or numbing medicines. °· History of bleeding problems or blood clots. °· Previous surgery. °· Other health problems, including diabetes and kidney problems. °· Possibility of pregnancy, if this applies. °RISKS AND COMPLICATIONS °· Pain. °· Bleeding. °· Scarring. °· Infection. °BEFORE THE PROCEDURE  °  You may need to have an ultrasound or other imaging tests to see how large or deep your cystic structure is. Blood tests may also be used to determine if you have an infection or how severe the infection is. You may need to have a tetanus shot. °PROCEDURE  °The affected area  is cleaned with a cleaning fluid. The cyst area will then be numbed with a medicine (local anesthetic). A small incision will be made in the cystic structure. A syringe or catheter may be used to drain the contents of the cystic structure, or the contents may be squeezed out. The area will then be flushed with a cleansing solution. After cleansing the area, it is often gently packed with a gauze or another wound dressing. Once it is packed, it will be covered with gauze and tape or some other type of wound dressing.  °AFTER THE PROCEDURE  °· Often, you will be allowed to go home right after the procedure. °· You may be given antibiotic medicine to prevent or heal an infection. °· If the area was packed with gauze or some other wound dressing, you will likely need to come back in 1 to 2 days to get it removed. °· The area should heal in about 14 days. °  °This information is not intended to replace advice given to you by your health care provider. Make sure you discuss any questions you have with your health care provider. °  °Document Released: 06/20/2000 Document Revised: 06/26/2011 Document Reviewed: 02/19/2011 °Elsevier Interactive Patient Education ©2016 [employer] ° °

## 2015-02-17 NOTE — ED Notes (Signed)
At bedside during i/d

## 2015-02-18 ENCOUNTER — Inpatient Hospital Stay (HOSPITAL_COMMUNITY)
Admission: EM | Admit: 2015-02-18 | Discharge: 2015-02-20 | DRG: 872 | Disposition: A | Discharge status: Home / Self Care | Payer: 59 | Attending: Internal Medicine | Admitting: Internal Medicine

## 2015-02-18 ENCOUNTER — Encounter (HOSPITAL_COMMUNITY): Payer: Self-pay | Admitting: Nurse Practitioner

## 2015-02-18 ENCOUNTER — Emergency Department (HOSPITAL_COMMUNITY): Payer: 59

## 2015-02-18 ENCOUNTER — Observation Stay (HOSPITAL_COMMUNITY): Payer: 59

## 2015-02-18 DIAGNOSIS — A419 Sepsis, unspecified organism: Secondary | ICD-10-CM | POA: Diagnosis not present

## 2015-02-18 DIAGNOSIS — L03314 Cellulitis of groin: Secondary | ICD-10-CM | POA: Diagnosis not present

## 2015-02-18 DIAGNOSIS — M1990 Unspecified osteoarthritis, unspecified site: Secondary | ICD-10-CM | POA: Diagnosis present

## 2015-02-18 DIAGNOSIS — D696 Thrombocytopenia, unspecified: Secondary | ICD-10-CM | POA: Diagnosis not present

## 2015-02-18 DIAGNOSIS — R600 Localized edema: Secondary | ICD-10-CM | POA: Diagnosis not present

## 2015-02-18 DIAGNOSIS — Z87891 Personal history of nicotine dependence: Secondary | ICD-10-CM

## 2015-02-18 DIAGNOSIS — N492 Inflammatory disorders of scrotum: Secondary | ICD-10-CM | POA: Diagnosis present

## 2015-02-18 DIAGNOSIS — Z8249 Family history of ischemic heart disease and other diseases of the circulatory system: Secondary | ICD-10-CM

## 2015-02-18 DIAGNOSIS — F1010 Alcohol abuse, uncomplicated: Secondary | ICD-10-CM | POA: Diagnosis not present

## 2015-02-18 DIAGNOSIS — Z832 Family history of diseases of the blood and blood-forming organs and certain disorders involving the immune mechanism: Secondary | ICD-10-CM

## 2015-02-18 DIAGNOSIS — Z789 Other specified health status: Secondary | ICD-10-CM | POA: Diagnosis present

## 2015-02-18 DIAGNOSIS — N5089 Other specified disorders of the male genital organs: Secondary | ICD-10-CM | POA: Diagnosis present

## 2015-02-18 DIAGNOSIS — N5082 Scrotal pain: Secondary | ICD-10-CM | POA: Diagnosis not present

## 2015-02-18 DIAGNOSIS — R590 Localized enlarged lymph nodes: Secondary | ICD-10-CM | POA: Diagnosis not present

## 2015-02-18 DIAGNOSIS — F1090 Alcohol use, unspecified, uncomplicated: Secondary | ICD-10-CM | POA: Diagnosis present

## 2015-02-18 DIAGNOSIS — Z833 Family history of diabetes mellitus: Secondary | ICD-10-CM

## 2015-02-18 DIAGNOSIS — Z836 Family history of other diseases of the respiratory system: Secondary | ICD-10-CM

## 2015-02-18 DIAGNOSIS — Z7289 Other problems related to lifestyle: Secondary | ICD-10-CM | POA: Diagnosis present

## 2015-02-18 DIAGNOSIS — I10 Essential (primary) hypertension: Secondary | ICD-10-CM | POA: Diagnosis present

## 2015-02-18 DIAGNOSIS — B9789 Other viral agents as the cause of diseases classified elsewhere: Secondary | ICD-10-CM | POA: Diagnosis present

## 2015-02-18 DIAGNOSIS — L02214 Cutaneous abscess of groin: Secondary | ICD-10-CM | POA: Diagnosis not present

## 2015-02-18 DIAGNOSIS — R609 Edema, unspecified: Secondary | ICD-10-CM | POA: Diagnosis present

## 2015-02-18 HISTORY — DX: Alcohol abuse, uncomplicated: F10.10

## 2015-02-18 LAB — CBC WITH DIFFERENTIAL/PLATELET
BASOS ABS: 0 10*3/uL (ref 0.0–0.1)
Basophils Relative: 0 %
Eosinophils Absolute: 0.1 10*3/uL (ref 0.0–0.7)
Eosinophils Relative: 2 %
HEMATOCRIT: 41.4 % (ref 39.0–52.0)
Hemoglobin: 14.1 g/dL (ref 13.0–17.0)
LYMPHS ABS: 1.1 10*3/uL (ref 0.7–4.0)
LYMPHS PCT: 11 %
MCH: 31.6 pg (ref 26.0–34.0)
MCHC: 34.1 g/dL (ref 30.0–36.0)
MCV: 92.8 fL (ref 78.0–100.0)
MONO ABS: 0.8 10*3/uL (ref 0.1–1.0)
Monocytes Relative: 9 %
NEUTROS ABS: 7.6 10*3/uL (ref 1.7–7.7)
Neutrophils Relative %: 78 %
Platelets: 142 10*3/uL — ABNORMAL LOW (ref 150–400)
RBC: 4.46 MIL/uL (ref 4.22–5.81)
RDW: 13.4 % (ref 11.5–15.5)
WBC: 9.6 10*3/uL (ref 4.0–10.5)

## 2015-02-18 LAB — COMPREHENSIVE METABOLIC PANEL
ALK PHOS: 63 U/L (ref 38–126)
ALT: 17 U/L (ref 17–63)
ANION GAP: 10 (ref 5–15)
AST: 24 U/L (ref 15–41)
Albumin: 3.5 g/dL (ref 3.5–5.0)
BILIRUBIN TOTAL: 1 mg/dL (ref 0.3–1.2)
BUN: 7 mg/dL (ref 6–20)
CALCIUM: 8.9 mg/dL (ref 8.9–10.3)
CO2: 27 mmol/L (ref 22–32)
Chloride: 97 mmol/L — ABNORMAL LOW (ref 101–111)
Creatinine, Ser: 1.02 mg/dL (ref 0.61–1.24)
GLUCOSE: 129 mg/dL — AB (ref 65–99)
POTASSIUM: 3.6 mmol/L (ref 3.5–5.1)
Sodium: 134 mmol/L — ABNORMAL LOW (ref 135–145)
TOTAL PROTEIN: 7.8 g/dL (ref 6.5–8.1)

## 2015-02-18 LAB — URINALYSIS, ROUTINE W REFLEX MICROSCOPIC
Bilirubin Urine: NEGATIVE
GLUCOSE, UA: NEGATIVE mg/dL
Hgb urine dipstick: NEGATIVE
KETONES UR: NEGATIVE mg/dL
LEUKOCYTES UA: NEGATIVE
Nitrite: NEGATIVE
PROTEIN: NEGATIVE mg/dL
Specific Gravity, Urine: 1.016 (ref 1.005–1.030)
pH: 6 (ref 5.0–8.0)

## 2015-02-18 LAB — PROTIME-INR
INR: 1.22 (ref 0.00–1.49)
Prothrombin Time: 15.6 seconds — ABNORMAL HIGH (ref 11.6–15.2)

## 2015-02-18 LAB — I-STAT CG4 LACTIC ACID, ED: Lactic Acid, Venous: 1.65 mmol/L (ref 0.5–2.0)

## 2015-02-18 LAB — LACTIC ACID, PLASMA: LACTIC ACID, VENOUS: 1 mmol/L (ref 0.5–2.0)

## 2015-02-18 LAB — PROCALCITONIN: Procalcitonin: <0.1 ng/mL

## 2015-02-18 LAB — APTT: aPTT: 34 seconds (ref 24–37)

## 2015-02-18 MED ORDER — VANCOMYCIN HCL IN DEXTROSE 1-5 GM/200ML-% IV SOLN
1000.0000 mg | Freq: Two times a day (BID) | INTRAVENOUS | Status: DC
  Administered 2015-02-19 – 2015-02-20 (×3): 1000 mg via INTRAVENOUS
  Filled 2015-02-18 (×5): qty 200

## 2015-02-18 MED ORDER — ACETAMINOPHEN 325 MG PO TABS: 650.0000 mg | ORAL_TABLET | Freq: Four times a day (QID) | ORAL | Status: DC | PRN

## 2015-02-18 MED ORDER — AMLODIPINE BESYLATE 5 MG PO TABS
5.0000 mg | ORAL_TABLET | Freq: Every day | ORAL | Status: DC
  Administered 2015-02-19 – 2015-02-20 (×2): 5 mg via ORAL
  Filled 2015-02-18 (×2): qty 1

## 2015-02-18 MED ORDER — THIAMINE HCL 100 MG/ML IJ SOLN
100.0000 mg | Freq: Every day | INTRAMUSCULAR | Status: DC
  Filled 2015-02-18: qty 2

## 2015-02-18 MED ORDER — LORAZEPAM 1 MG PO TABS: 1.0000 mg | ORAL_TABLET | Freq: Four times a day (QID) | ORAL | Status: DC | PRN

## 2015-02-18 MED ORDER — VITAMIN B-1 100 MG PO TABS
100.0000 mg | ORAL_TABLET | Freq: Every day | ORAL | Status: DC
  Administered 2015-02-18 – 2015-02-20 (×3): 100 mg via ORAL
  Filled 2015-02-18 (×3): qty 1

## 2015-02-18 MED ORDER — SODIUM CHLORIDE 0.9% FLUSH
3.0000 mL | Freq: Two times a day (BID) | INTRAVENOUS | Status: DC
  Administered 2015-02-20: 3 mL via INTRAVENOUS

## 2015-02-18 MED ORDER — OXYCODONE-ACETAMINOPHEN 5-325 MG PO TABS
2.0000 | ORAL_TABLET | ORAL | Status: DC | PRN
  Administered 2015-02-19 – 2015-02-20 (×4): 2 via ORAL
  Filled 2015-02-18 (×4): qty 2

## 2015-02-18 MED ORDER — ENOXAPARIN SODIUM 40 MG/0.4ML ~~LOC~~ SOLN
40.0000 mg | SUBCUTANEOUS | Status: DC
  Administered 2015-02-18 – 2015-02-19 (×2): 40 mg via SUBCUTANEOUS
  Filled 2015-02-18 (×2): qty 0.4

## 2015-02-18 MED ORDER — SODIUM CHLORIDE 0.9 % IV BOLUS (SEPSIS)
1000.0000 mL | Freq: Once | INTRAVENOUS | Status: AC
  Administered 2015-02-18: 1000 mL via INTRAVENOUS

## 2015-02-18 MED ORDER — MORPHINE SULFATE (PF) 2 MG/ML IV SOLN
2.0000 mg | INTRAVENOUS | Status: DC | PRN
Start: 2015-02-18 — End: 2015-02-20
  Administered 2015-02-20: 2 mg via INTRAVENOUS
  Filled 2015-02-18: qty 1

## 2015-02-18 MED ORDER — DEXTROSE 5 % IV SOLN
2.0000 g | Freq: Three times a day (TID) | INTRAVENOUS | Status: DC
  Administered 2015-02-19 – 2015-02-20 (×4): 2 g via INTRAVENOUS
  Filled 2015-02-18 (×6): qty 2

## 2015-02-18 MED ORDER — LORAZEPAM 2 MG/ML IJ SOLN: 0.0000 mg | Freq: Two times a day (BID) | INTRAMUSCULAR | Status: DC

## 2015-02-18 MED ORDER — ONDANSETRON HCL 4 MG/2ML IJ SOLN
4.0000 mg | Freq: Once | INTRAMUSCULAR | Status: AC
  Administered 2015-02-18: 4 mg via INTRAVENOUS
  Filled 2015-02-18: qty 2

## 2015-02-18 MED ORDER — FOLIC ACID 1 MG PO TABS
1.0000 mg | ORAL_TABLET | Freq: Every day | ORAL | Status: DC
  Administered 2015-02-18 – 2015-02-20 (×3): 1 mg via ORAL
  Filled 2015-02-18 (×3): qty 1

## 2015-02-18 MED ORDER — LORAZEPAM 2 MG/ML IJ SOLN: 0.0000 mg | Freq: Four times a day (QID) | INTRAMUSCULAR | Status: DC

## 2015-02-18 MED ORDER — MORPHINE SULFATE (PF) 4 MG/ML IV SOLN
4.0000 mg | Freq: Once | INTRAVENOUS | Status: AC
  Administered 2015-02-18: 4 mg via INTRAVENOUS
  Filled 2015-02-18: qty 1

## 2015-02-18 MED ORDER — DEXTROSE 5 % IV SOLN
2.0000 g | Freq: Once | INTRAVENOUS | Status: AC
  Administered 2015-02-18: 2 g via INTRAVENOUS
  Filled 2015-02-18: qty 2

## 2015-02-18 MED ORDER — ACETAMINOPHEN 650 MG RE SUPP: 650.0000 mg | Freq: Four times a day (QID) | RECTAL | Status: DC | PRN

## 2015-02-18 MED ORDER — IOHEXOL 300 MG/ML  SOLN
100.0000 mL | Freq: Once | INTRAMUSCULAR | Status: AC | PRN
  Administered 2015-02-18: 100 mL via INTRAVENOUS

## 2015-02-18 MED ORDER — SODIUM CHLORIDE 0.9 % IV SOLN
INTRAVENOUS | Status: DC
  Administered 2015-02-18: 1000 mL via INTRAVENOUS

## 2015-02-18 MED ORDER — SODIUM CHLORIDE 0.9 % IV BOLUS (SEPSIS): 2000.0000 mL | Freq: Once | INTRAVENOUS | Status: AC

## 2015-02-18 MED ORDER — VANCOMYCIN HCL 10 G IV SOLR
2000.0000 mg | Freq: Once | INTRAVENOUS | Status: AC
  Administered 2015-02-18: 2000 mg via INTRAVENOUS
  Filled 2015-02-18: qty 2000

## 2015-02-18 MED ORDER — ACETAMINOPHEN 325 MG PO TABS
650.0000 mg | ORAL_TABLET | Freq: Once | ORAL | Status: AC
  Administered 2015-02-18: 650 mg via ORAL
  Filled 2015-02-18: qty 2

## 2015-02-18 MED ORDER — SODIUM CHLORIDE 0.9 % IV BOLUS (SEPSIS)
1000.0000 mL | Freq: Once | INTRAVENOUS | Status: AC
Start: 2015-02-18 — End: 2015-02-18
  Administered 2015-02-18: 1000 mL via INTRAVENOUS

## 2015-02-18 MED ORDER — ZOLPIDEM TARTRATE 5 MG PO TABS
5.0000 mg | ORAL_TABLET | Freq: Once | ORAL | Status: AC
  Administered 2015-02-18: 5 mg via ORAL
  Filled 2015-02-18: qty 1

## 2015-02-18 MED ORDER — CYCLOBENZAPRINE HCL 10 MG PO TABS
10.0000 mg | ORAL_TABLET | Freq: Three times a day (TID) | ORAL | Status: DC | PRN
  Administered 2015-02-19 – 2015-02-20 (×3): 10 mg via ORAL
  Filled 2015-02-18 (×3): qty 1

## 2015-02-18 MED ORDER — LORAZEPAM 2 MG/ML IJ SOLN: 1.0000 mg | Freq: Four times a day (QID) | INTRAMUSCULAR | Status: DC | PRN

## 2015-02-18 MED ORDER — ONDANSETRON HCL 4 MG/2ML IJ SOLN: 4.0000 mg | Freq: Three times a day (TID) | INTRAMUSCULAR | Status: AC | PRN

## 2015-02-18 MED ORDER — ADULT MULTIVITAMIN W/MINERALS CH
1.0000 | ORAL_TABLET | Freq: Every day | ORAL | Status: DC
  Administered 2015-02-18 – 2015-02-20 (×3): 1 via ORAL
  Filled 2015-02-18 (×3): qty 1

## 2015-02-18 MED ORDER — ALUM & MAG HYDROXIDE-SIMETH 200-200-20 MG/5ML PO SUSP: 30.0000 mL | Freq: Four times a day (QID) | ORAL | Status: DC | PRN

## 2015-02-18 NOTE — H&P (Signed)
[facility] History and Physical  AARIK BLANK ZOX:[id number] DOB: 30-Apr-1955 DOA: 02/18/2015  Referring physician: ED physician PCP: Lupe Carney, MD  Specialists:   Chief Complaint: pain in scrotum and groin area  HPI: Larry Houston is a 60 y.o. male with PMH of hypertension, alcohol abuse and arthritis, who presents with pain in groin area and scrotum.  Pt reports that he had an abscess in left groin area. He was seen by urgent care. He underwent I&D and packed yesterday at urgent care. He was prescribed Bactrim and pain meds. He states that he continues to have pain, which is 10/10, sharp, and nonradiating. The pain is also involving right bottom of scrotum. No penile discharge, penile pain or penile swelling. No difficulty urination or symptoms of UTI. Patient does not have fever, but has chills, no nausea, vomiting, abdominal pain, diarrhea, chest pain, shortness breath, unilateral weakness.  In ED, patient was found to have WBC 9.6, negative urinalysis, temperature 100.3, tachycardia, electrolytes and renal function okay. Patient's admitted to inpatient for further interventional treatment.  # CT-pelvis showed: 1. large amount of soft tissue edema/fluid about the scrotum and  throughout the perineum. Tiny circumscribed fluid density collection at the posterior margin of the lower right scrotum, measuring only 5 mm greatest dimension, no associated air or tract to confirm. 2. Additional soft tissue thickening/phlegmon at the inferomedial aspect of the left inguinal region, superficial in location, measuring approximately 2.4 cm greatest dimension, with single punctate focus of air. No fluid density collection appreciated in this area. Additional clinical data states that there was a recent abscess incision, possibly at this location given the punctate focus of air. 3. Overall, no drainable fluid collection identified. 4. No soft tissue gas about the scrotum or within the perineum  to confirm a Fournier gangrene. 5. Numerous enlarged lymph nodes within the inguinal regions bilaterally, and within the iliac chain regions bilaterally, likely reactive in nature.  EKG: Not done in ED, will get one.   Where does patient live?   At home   Can patient participate in ADLs?  Yes  Review of Systems:  General: no fevers, has chills, no changes in body weight, has poor appetite, has fatigue HEENT: no blurry vision, hearing changes or sore throat Pulm: no dyspnea, coughing, wheezing CV: no chest pain, palpitations Abd: no nausea, vomiting, abdominal pain, diarrhea, constipation GU: no dysuria, burning on urination, increased urinary frequency, hematuria. No penile discharge, swelling or tenderness. Ext: no leg edema Neuro: no unilateral weakness, numbness, or tingling, no vision change or hearing loss Skin: has abscess draining in left groin area and R scrotum bottom. MSK: No muscle spasm, no deformity, no limitation of range of movement in spin Heme: No easy bruising.  Travel history: No recent long distant travel.  Allergy:  Allergies  Allergen Reactions  . Penicillins Hives    Has patient had a PCN reaction causing immediate rash, facial/tongue/throat swelling, SOB or lightheadedness with hypotension: No Has patient had a PCN reaction causing severe rash involving mucus membranes or skin necrosis: No Has patient had a PCN reaction that required hospitalization No Has patient had a PCN reaction occurring within the last 10 years: No If all of the above answers are "NO", then may proceed with Cephalosporin use.    Past Medical History  Diagnosis Date  . Hypertension   . Arthritis     lumbar stenosis   . Alcohol abuse     Past Surgical History  Procedure Laterality Date  .  Neck surgery  1992    posterior fusion- cervical   . Nasal endoscopy with epistaxis control N/A 04/04/2014    Procedure: NASAL ENDOSCOPY WITH EPISTAXIS CONTROL;  Surgeon: Melvenia Beam, MD;   Location: [facility];  Service: ENT;  Laterality: N/A;  . Lumbar laminectomy/decompression microdiscectomy Bilateral 01/26/2015    Procedure: Laminectomy and Foraminotomy - L4-L5 - bilateral;  Surgeon: Tia Alert, MD;  Location: [facility];  Service: Neurosurgery;  Laterality: Bilateral;  Laminectomy and Foraminotomy - L4-L5 - bilateral    Social History:  reports that he quit smoking about 9 years ago. He does not have any smokeless tobacco history on file. He reports that he drinks alcohol. He reports that he does not use illicit drugs.  Family History:  Family History  Problem Relation Age of Onset  . Diabetes Mother   . Hypertension Mother   . Anemia Mother     Died of aplastic anemia  . Lung disease Father   . Heart attack Brother   . Heart disease Sister     One sister had heart arrest     Prior to Admission medications   Medication Sig Start Date End Date Taking? Authorizing Provider  acetaminophen (TYLENOL) 500 MG tablet Take 500 mg by mouth every 6 (six) hours as needed for mild pain.    Historical Provider, MD  amLODipine (NORVASC) 5 MG tablet Take 5 mg by mouth daily before breakfast.     Historical Provider, MD  cyclobenzaprine (FLEXERIL) 10 MG tablet Take 1 tablet (10 mg total) by mouth 3 (three) times daily as needed for muscle spasms. 01/27/15   Tia Alert, MD  econazole nitrate 1 % cream Apply 1 application topically daily as needed. 12/27/14   Historical Provider, MD  HYDROcodone-acetaminophen (NORCO/VICODIN) 5-325 MG tablet Take 2 tablets by mouth every 4 (four) hours as needed. 02/17/15   Elson Areas, PA-C  oxyCODONE-acetaminophen (PERCOCET/ROXICET) 5-325 MG tablet Take 1 tablet by mouth every 4 (four) hours as needed for moderate pain. 01/27/15   Tia Alert, MD  sulfamethoxazole-trimethoprim (BACTRIM DS,SEPTRA DS) 800-160 MG tablet Take 1 tablet by mouth 2 (two) times daily. 02/17/15 02/24/15  Elson Areas, PA-C    Physical Exam: Filed Vitals:   02/18/15 1544  02/18/15 1733 02/18/15 1938  BP: 177/93 116/90   Pulse: 112 103   Temp: 97.9 F (36.6 C) 100.3 F (37.9 C) 102.6 F (39.2 C)  TempSrc: Oral Oral Rectal  Resp: 18 16   Height:  (1.854 m)    Weight: 112.492 kg (248 lb)    SpO2: 95% 97%    General: Not in acute distress HEENT:       Eyes: PERRL, EOMI, no scleral icterus.       ENT: No discharge from the ears and nose, no pharynx injection, no tonsillar enlargement.        Neck: No JVD, no bruit, no mass felt. Heme: No neck lymph node enlargement. Cardiac: S1/S2, RRR, tachycardia, No murmurs, No gallops or rubs. Pulm: No rales, wheezing, rhonchi or rubs. Abd: Soft, nondistended, nontender, no rebound pain, no organomegaly, BS present. GU: has swelling and tenderness to scrotum bilaterally, erythema across the scrotum. Has pontaneously ruptured abscess in inferior right scrotum. The drained abscess in the left inguinal region shows minimal drainage with purulent discharge. Packing still in place. Ext: No pitting leg edema bilaterally. 2+DP/PT pulse bilaterally. Musculoskeletal: No joint deformities, No joint redness or warmth, no limitation of ROM in spin.  Skin: No rashes.  Neuro: Alert, oriented X3, cranial nerves II-XII grossly intact, moves all extremities normally. Psych: Patient is not psychotic, no suicidal or hemocidal ideation.  Labs on Admission:  Basic Metabolic Panel:  Recent Labs Lab 02/18/15 1625  NA 134*  K 3.6  CL 97*  CO2 27  GLUCOSE 129*  BUN 7  CREATININE 1.02  CALCIUM 8.9   Liver Function Tests:  Recent Labs Lab 02/18/15 1625  AST 24  ALT 17  ALKPHOS 63  BILITOT 1.0  PROT 7.8  ALBUMIN 3.5   No results for input(s): LIPASE, AMYLASE in the last 168 hours. No results for input(s): AMMONIA in the last 168 hours. CBC:  Recent Labs Lab 02/18/15 1625  WBC 9.6  NEUTROABS 7.6  HGB 14.1  HCT 41.4  MCV 92.8  PLT 142*   Cardiac Enzymes: No results for input(s): CKTOTAL, CKMB, CKMBINDEX,  TROPONINI in the last 168 hours.  BNP (last 3 results) No results for input(s): BNP in the last 8760 hours.  ProBNP (last 3 results) No results for input(s): PROBNP in the last 8760 hours.  CBG: No results for input(s): GLUCAP in the last 168 hours.  Radiological Exams on Admission: Ct Pelvis W Contrast  02/18/2015  CLINICAL DATA:  Swelling, painful scrotum with 2 areas of foul-smelling discharge in the scrotum and perineum. EXAM: CT PELVIS WITH CONTRAST TECHNIQUE: Multidetector CT imaging of the pelvis was performed using the standard protocol following the bolus administration of intravenous contrast. CONTRAST:  OMNIPAQUE IOHEXOL 300 MG/ML  SOLN COMPARISON:  None. FINDINGS: Large amount soft tissue edema/ fluid about the scrotum and throughout the perineum. Tiny circumscribed fluid density collection at the posterior margin of the lower right scrotum (series 201, image 62) measures only 5 mm greatest dimension, possibly a tiny abscess by without air to confirm. Additional soft tissue thickening/phlegmon is seen at the inferomedial aspect of the left inguinal region (series 201, image 44), measuring approximately 2.4 cm greatest dimension, with punctate focus of associated air. No fluid density collection is appreciated in this area. No soft tissue gas within the scrotum or perineum to suggest Fournier gangrene. Numerous enlarged lymph nodes are seen within the inguinal regions bilaterally, likely reactive in nature. Visualized portion of the bowel, within the pelvis and lower abdomen, is normal in caliber and configuration. No bowel wall thickening or evidence of bowel wall inflammation. No free fluid or abscess within the intraperitoneal abdomen or pelvis. Prominent lymph nodes are seen within the iliac chain regions bilaterally, also likely reactive in nature. Incidental note made of a 2 mm stone within the bladder. Prostate gland is at least mildly enlarged causing slight mass effect on the  bladder base. No acute osseous abnormality. IMPRESSION: 1. Large amount of soft tissue edema/fluid about the scrotum and throughout the perineum. Tiny circumscribed fluid density collection at the posterior margin of the lower right scrotum, measuring only 5 mm greatest dimension, perhaps a tiny abscess but no associated air or tract to confirm. 2. Additional soft tissue thickening/phlegmon at the inferomedial aspect of the left inguinal region, superficial in location, measuring approximately 2.4 cm greatest dimension, with single punctate focus of air. No fluid density collection appreciated in this area. Additional clinical data states that there was a recent abscess incision, possibly at this location given the punctate focus of air. 3. Overall, no drainable fluid collection identified. 4. No soft tissue gas about the scrotum or within the perineum to confirm a Fournier gangrene. 5. Numerous enlarged  lymph nodes within the inguinal regions bilaterally, and within the iliac chain regions bilaterally, likely reactive in nature. 6. Additional chronic/incidental findings detailed above. Electronically Signed   By: Bary Richard M.D.   On: 02/18/2015 18:51    Assessment/Plan Principal Problem:   Abscess of groin Active Problems:   Essential hypertension   Alcohol use (HCC)   Thrombocytopenia (HCC)   Cellulitis of groin   Sepsis (HCC)   Scrotal abscess  Sepsis due to abscess/cellulitis in groin and scrotum: Patient is septic with fever and tachycardia. CT of pelvis did not show Fournier gangrene or significant free gas in the soft tissue. No penile involvement. Currently hemodynamically stable.  - will admit to tele bed (due to tachycardia and sepsis) for observation - Doppler of scrotum to evaluate blood flow - Empiric antimicrobial treatment with vancomycin and aztreonam per pharmacy - PRN Zofran for nausea, morphine and Percocet for pain - Blood cultures x 2  - wound care consult - will get  Procalcitonin and trend lactic acid levels per sepsis protocol. - IVF: 3.0 L of NS bolus in ED, followed by 75 cc/h  HTN: -continue amlodipine  Alcohol abuse: -Did counseling about the importance of quitting drinking -CIWA protocol  Mild thrombocytopenia (HCC): platelet 142. Likely due to alcohol abuse. -Follow-up by CBC  DVT ppx: SQ Lovenox  Code Status: Full code Family Communication: None at bed side.  Disposition Plan: Admit to inpatient   Date of Service 02/18/2015    Lorretta Harp [facility] Pager [phone]  If 7PM-7AM, please contact night-coverage [email or web address] Password [facility] 02/18/2015, 9:37 PM

## 2015-02-18 NOTE — ED Provider Notes (Signed)
Had abscess incised and drained at urgent care center yesterday at right suprapubic area. He presents to the emergency department and he has noticed swelling in his scrotum today. Symptoms covered by chills. No known fever. On exam patient is alert and nontoxic. Abdomen soft nontender. Suprapubic area with packed wound with yellowish drainage on gauze packing. At the left side of the scrotum there is a yellowish drainage, blood-tinged. With mild corresponding tenderness at suprapubic area and scrotum. Scrotal and penis are swollen.  Doug Sou, MD 02/18/15 [phone]

## 2015-02-18 NOTE — ED Notes (Addendum)
He was at [facility] yesterday for an abscess to groin which they drained and packed. Today he's had more pain and swelling at the site which is moving into his scrotum. He also had some chills.

## 2015-02-18 NOTE — ED Provider Notes (Signed)
CSN: [id number]     Arrival date & time 02/18/15  1534 History   By signing my name below, I, Larry Houston, attest that this documentation has been prepared under the direction and in the presence of Shawn Joy, PA-C. Electronically Signed: Evon Houston, ED Scribe. 02/18/2015. 3:51 PM.     Chief Complaint  Patient presents with  . Wound Infection   The history is provided by the patient. No language interpreter was used.   HPI Comments: Larry Houston is a 60 y.o. male who presents to the Emergency Department complaining of abscess to his left groin onset 1 day prior. Pt states that he had the abscess lanced and packed yesterday at an urgent care and now presents with swelling and worsening pain in his scrotum. Pt rates the severity of his pain 10/10, sharp, and nonradiating. He reports associated drainage from the area. Pt reports experiencing chills as well. Denies fever, nausea, vomiting, abdominal pain, penile pain, difficulty urinating, pelvic pain, or any other complaints. Patient adds that he has frequent abscesses in the groin, which he thinks may be due to his  chronic genital itching. Patient was placed on Bactrim following the I&D yesterday.    Past Medical History  Diagnosis Date  . Hypertension   . Arthritis     lumbar stenosis    Past Surgical History  Procedure Laterality Date  . Neck surgery  1992    posterior fusion- cervical   . Nasal endoscopy with epistaxis control N/A 04/04/2014    Procedure: NASAL ENDOSCOPY WITH EPISTAXIS CONTROL;  Surgeon: Melvenia Beam, MD;  Location: [facility] OR;  Service: ENT;  Laterality: N/A;  . Lumbar laminectomy/decompression microdiscectomy Bilateral 01/26/2015    Procedure: Laminectomy and Foraminotomy - L4-L5 - bilateral;  Surgeon: Tia Alert, MD;  Location: [facility];  Service: Neurosurgery;  Laterality: Bilateral;  Laminectomy and Foraminotomy - L4-L5 - bilateral   History reviewed. No pertinent family history. Social History   Substance Use Topics  . Smoking status: Former Smoker    Quit date: 01/13/2006  . Smokeless tobacco: None  . Alcohol Use: Yes     Comment: occasional     Review of Systems  Constitutional: Positive for chills. Negative for fever.  Gastrointestinal: Negative for nausea, vomiting and abdominal pain.  Genitourinary: Positive for scrotal swelling. Negative for penile pain.  Skin: Positive for wound.     Allergies  Penicillins  Home Medications   Prior to Admission medications   Medication Sig Start Date End Date Taking? Authorizing Provider  acetaminophen (TYLENOL) 500 MG tablet Take 500 mg by mouth every 6 (six) hours as needed for mild pain.    Historical Provider, MD  amLODipine (NORVASC) 5 MG tablet Take 5 mg by mouth daily before breakfast.     Historical Provider, MD  cyclobenzaprine (FLEXERIL) 10 MG tablet Take 1 tablet (10 mg total) by mouth 3 (three) times daily as needed for muscle spasms. 01/27/15   Tia Alert, MD  econazole nitrate 1 % cream Apply 1 application topically daily as needed. 12/27/14   Historical Provider, MD  HYDROcodone-acetaminophen (NORCO/VICODIN) 5-325 MG tablet Take 2 tablets by mouth every 4 (four) hours as needed. 02/17/15   Elson Areas, PA-C  oxyCODONE-acetaminophen (PERCOCET/ROXICET) 5-325 MG tablet Take 1 tablet by mouth every 4 (four) hours as needed for moderate pain. 01/27/15   Tia Alert, MD  sulfamethoxazole-trimethoprim (BACTRIM DS,SEPTRA DS) 800-160 MG tablet Take 1 tablet by mouth 2 (two) times daily. 02/17/15  02/24/15  Elson Areas, PA-C   BP 116/90 mmHg  Pulse 103  Temp(Src) 100.3 F (37.9 C) (Oral)  Resp 16  Ht  (1.854 m)  Wt 112.492 kg  BMI 32.73 kg/m2  SpO2 97%   Physical Exam  Constitutional: He is oriented to person, place, and time. He appears well-developed and well-nourished. No distress.  HENT:  Head: Normocephalic and atraumatic.  Eyes: Conjunctivae and EOM are normal.  Neck: Neck supple.  Cardiovascular:  Normal rate.   Pulmonary/Chest: Effort normal. No respiratory distress.  Abdominal: Soft. Bowel sounds are normal. There is no tenderness.  Genitourinary:  Swelling and tenderness to scrotum bilaterally, erythema across the scrotum.  On inferior right scrotum large fluctuant area that spontaneously ruptured with copious brown, foul smelling discharge, with a smell similar to stool.  Drained abscess in the left inguinal region shows minimal drainage of seemingly purulent discharge. Packing still in place. Chaperone Present.   Musculoskeletal: Normal range of motion.  Lymphadenopathy:    He has no cervical adenopathy.       Right: No inguinal adenopathy present.       Left: No inguinal adenopathy present.  Neurological: He is alert and oriented to person, place, and time.  Skin: Skin is warm and dry.  Psychiatric: He has a normal mood and affect. His behavior is normal.  Nursing note and vitals reviewed.   ED Course  Procedures (including critical care time) DIAGNOSTIC STUDIES: Oxygen Saturation is 95% on RA, adequate by my interpretation.    COORDINATION OF CARE: 4:08 PM-Discussed treatment plan with pt at bedside and pt agreed to plan.     Labs Review Labs Reviewed  CBC WITH DIFFERENTIAL/PLATELET - Abnormal; Notable for the following:    Platelets 142 (*)    All other components within normal limits  COMPREHENSIVE METABOLIC PANEL - Abnormal; Notable for the following:    Sodium 134 (*)    Chloride 97 (*)    Glucose, Bld 129 (*)    All other components within normal limits  URINE CULTURE  WOUND CULTURE  CULTURE, BLOOD (ROUTINE X 2)  CULTURE, BLOOD (ROUTINE X 2)  URINALYSIS, ROUTINE W REFLEX MICROSCOPIC (NOT AT [facility])  I-STAT CG4 LACTIC ACID, ED    Imaging Review Ct Pelvis W Contrast  02/18/2015  CLINICAL DATA:  Swelling, painful scrotum with 2 areas of foul-smelling discharge in the scrotum and perineum. EXAM: CT PELVIS WITH CONTRAST TECHNIQUE: Multidetector CT imaging  of the pelvis was performed using the standard protocol following the bolus administration of intravenous contrast. CONTRAST:  OMNIPAQUE IOHEXOL 300 MG/ML  SOLN COMPARISON:  None. FINDINGS: Large amount soft tissue edema/ fluid about the scrotum and throughout the perineum. Tiny circumscribed fluid density collection at the posterior margin of the lower right scrotum (series 201, image 62) measures only 5 mm greatest dimension, possibly a tiny abscess by without air to confirm. Additional soft tissue thickening/phlegmon is seen at the inferomedial aspect of the left inguinal region (series 201, image 44), measuring approximately 2.4 cm greatest dimension, with punctate focus of associated air. No fluid density collection is appreciated in this area. No soft tissue gas within the scrotum or perineum to suggest Fournier gangrene. Numerous enlarged lymph nodes are seen within the inguinal regions bilaterally, likely reactive in nature. Visualized portion of the bowel, within the pelvis and lower abdomen, is normal in caliber and configuration. No bowel wall thickening or evidence of bowel wall inflammation. No free fluid or abscess within the intraperitoneal  abdomen or pelvis. Prominent lymph nodes are seen within the iliac chain regions bilaterally, also likely reactive in nature. Incidental note made of a 2 mm stone within the bladder. Prostate gland is at least mildly enlarged causing slight mass effect on the bladder base. No acute osseous abnormality. IMPRESSION: 1. Large amount of soft tissue edema/fluid about the scrotum and throughout the perineum. Tiny circumscribed fluid density collection at the posterior margin of the lower right scrotum, measuring only 5 mm greatest dimension, perhaps a tiny abscess but no associated air or tract to confirm. 2. Additional soft tissue thickening/phlegmon at the inferomedial aspect of the left inguinal region, superficial in location, measuring approximately 2.4 cm  greatest dimension, with single punctate focus of air. No fluid density collection appreciated in this area. Additional clinical data states that there was a recent abscess incision, possibly at this location given the punctate focus of air. 3. Overall, no drainable fluid collection identified. 4. No soft tissue gas about the scrotum or within the perineum to confirm a Fournier gangrene. 5. Numerous enlarged lymph nodes within the inguinal regions bilaterally, and within the iliac chain regions bilaterally, likely reactive in nature. 6. Additional chronic/incidental findings detailed above. Electronically Signed   By: Bary Richard M.D.   On: 02/18/2015 18:51      EKG Interpretation None      MDM   Final diagnoses:  Scrotal swelling  Scrotal pain  Scrotal abscess    Larry Houston presents with bilateral scrotal pain and swelling since yesterday. Patient also has an area of fluctuance that spontaneously ruptured the perineum with copious foul-smelling discharge.  Findings and plan of care discussed with Doug Sou, MD.  Patient is nontoxic appearing, afebrile, not tachycardic, not tachypneic, maintains SPO2 of 97-99% on room air, and is in no apparent distress. Patient has no signs of sepsis or other serious or life-threatening condition.After conferring with Dr. Rennis Chris, a pelvic CT with contrast was decided upon as well as basic labs. Wound culture was also collected. Patient's pain was controlled with conservative measures while here in the ED. While here in the ED, the patient became febrile and remains tachycardic despite 2 L of IV fluids. Lactic acid and wound cultures were drawn as well as vancomycin and Zosyn ordered. Patient admitted for IV antibiotics. 7:45 PM Spoke with Dr. Clyde Lundborg, hospitalist, who agreed to admit the patient to telemetry bed. No further instructions.  Filed Vitals:   02/18/15 1544 02/18/15 1733  BP: 177/93 116/90  Pulse: 112 103  Temp: 97.9 F (36.6 C)  100.3 F (37.9 C)  TempSrc: Oral Oral  Resp: 18 16  Height:  (1.854 m)   Weight: 112.492 kg   SpO2: 95% 97%     I personally performed the services described in this documentation, which was scribed in my presence. The recorded information has been reviewed and is accurate.      Anselm Pancoast, PA-C 02/18/15 1947  Doug Sou, MD 02/19/15 [id number]

## 2015-02-18 NOTE — ED Notes (Signed)
Pt ambulatory to bathroom without difficulty.  

## 2015-02-18 NOTE — ED Notes (Signed)
Pt transported to CT ?

## 2015-02-18 NOTE — Consult Note (Signed)
Pharmacy Antibiotic Note  Larry Houston is a 60 y.o. male admitted on 02/18/2015 with wound infection.  Pharmacy has been consulted for vanc/aztreonam dosing.  Pt presented to Urgent Care yesterday and given Bactrim for groin abscess after I&D. Presented today to [facility] d/t worsening pain and swelling that has moved to his scrotum. Pt reports he has frequent abscesses in the groin.   Plan: Vanc 2000 mg IV x1, then 1000 mg IV q12h Aztreonam 2 g IV q8h F/u renal function, LOT, cultures, VT prn  Height:  (185.4 cm) Weight: 248 lb (112.492 kg) IBW/kg (Calculated) : 79.9  Temp (24hrs), Avg:99.1 F (37.3 C), Min:97.9 F (36.6 C), Max:100.3 F (37.9 C)   Recent Labs Lab 02/18/15 1625  WBC 9.6  CREATININE 1.02    Estimated Creatinine Clearance: 103.7 mL/min (by C-G formula based on Cr of 1.02).    Allergies  Allergen Reactions  . Penicillins Hives    Antimicrobials this admission: Vanc 2/10 >>  Aztreonam 2/10 >>   Dose adjustments this admission: n/a  Microbiology results: 2/10 BCx:  2/10 UCx:   2/10 Wound:    Thank you for allowing pharmacy to be a part of this patient's care.  Greggory Stallion, PharmD Clinical Pharmacy Resident Pager # [phone] 02/18/2015 8:44 PM

## 2015-02-19 ENCOUNTER — Encounter (HOSPITAL_COMMUNITY): Payer: Self-pay

## 2015-02-19 DIAGNOSIS — Z836 Family history of other diseases of the respiratory system: Secondary | ICD-10-CM | POA: Diagnosis not present

## 2015-02-19 DIAGNOSIS — Z832 Family history of diseases of the blood and blood-forming organs and certain disorders involving the immune mechanism: Secondary | ICD-10-CM | POA: Diagnosis not present

## 2015-02-19 DIAGNOSIS — L02214 Cutaneous abscess of groin: Secondary | ICD-10-CM

## 2015-02-19 DIAGNOSIS — N5089 Other specified disorders of the male genital organs: Secondary | ICD-10-CM | POA: Diagnosis present

## 2015-02-19 DIAGNOSIS — F1010 Alcohol abuse, uncomplicated: Secondary | ICD-10-CM | POA: Diagnosis present

## 2015-02-19 DIAGNOSIS — B9789 Other viral agents as the cause of diseases classified elsewhere: Secondary | ICD-10-CM | POA: Diagnosis present

## 2015-02-19 DIAGNOSIS — A419 Sepsis, unspecified organism: Secondary | ICD-10-CM | POA: Diagnosis present

## 2015-02-19 DIAGNOSIS — D696 Thrombocytopenia, unspecified: Secondary | ICD-10-CM | POA: Diagnosis present

## 2015-02-19 DIAGNOSIS — N492 Inflammatory disorders of scrotum: Secondary | ICD-10-CM | POA: Diagnosis present

## 2015-02-19 DIAGNOSIS — M1990 Unspecified osteoarthritis, unspecified site: Secondary | ICD-10-CM | POA: Diagnosis present

## 2015-02-19 DIAGNOSIS — R609 Edema, unspecified: Secondary | ICD-10-CM | POA: Diagnosis present

## 2015-02-19 DIAGNOSIS — L03314 Cellulitis of groin: Secondary | ICD-10-CM | POA: Diagnosis present

## 2015-02-19 DIAGNOSIS — I10 Essential (primary) hypertension: Secondary | ICD-10-CM | POA: Diagnosis present

## 2015-02-19 DIAGNOSIS — Z833 Family history of diabetes mellitus: Secondary | ICD-10-CM | POA: Diagnosis not present

## 2015-02-19 DIAGNOSIS — Z87891 Personal history of nicotine dependence: Secondary | ICD-10-CM | POA: Diagnosis not present

## 2015-02-19 DIAGNOSIS — Z8249 Family history of ischemic heart disease and other diseases of the circulatory system: Secondary | ICD-10-CM | POA: Diagnosis not present

## 2015-02-19 LAB — BASIC METABOLIC PANEL
ANION GAP: 9 (ref 5–15)
BUN: 7 mg/dL (ref 6–20)
CALCIUM: 8.7 mg/dL — AB (ref 8.9–10.3)
CO2: 26 mmol/L (ref 22–32)
Chloride: 102 mmol/L (ref 101–111)
Creatinine, Ser: 0.96 mg/dL (ref 0.61–1.24)
GLUCOSE: 138 mg/dL — AB (ref 65–99)
Potassium: 3.7 mmol/L (ref 3.5–5.1)
SODIUM: 137 mmol/L (ref 135–145)

## 2015-02-19 LAB — RAPID URINE DRUG SCREEN, HOSP PERFORMED
AMPHETAMINES: NOT DETECTED
Barbiturates: NOT DETECTED
Benzodiazepines: NOT DETECTED
Cocaine: NOT DETECTED
Opiates: POSITIVE — AB
TETRAHYDROCANNABINOL: NOT DETECTED

## 2015-02-19 LAB — CBC
HEMATOCRIT: 38.5 % — AB (ref 39.0–52.0)
HEMOGLOBIN: 13.1 g/dL (ref 13.0–17.0)
MCH: 31.6 pg (ref 26.0–34.0)
MCHC: 34 g/dL (ref 30.0–36.0)
MCV: 92.8 fL (ref 78.0–100.0)
Platelets: 128 10*3/uL — ABNORMAL LOW (ref 150–400)
RBC: 4.15 MIL/uL — AB (ref 4.22–5.81)
RDW: 13.4 % (ref 11.5–15.5)
WBC: 7.8 10*3/uL (ref 4.0–10.5)

## 2015-02-19 LAB — HIV ANTIBODY (ROUTINE TESTING W REFLEX): HIV SCREEN 4TH GENERATION: NONREACTIVE

## 2015-02-19 LAB — LACTIC ACID, PLASMA: LACTIC ACID, VENOUS: 1.1 mmol/L (ref 0.5–2.0)

## 2015-02-19 MED ORDER — SENNOSIDES-DOCUSATE SODIUM 8.6-50 MG PO TABS
2.0000 | ORAL_TABLET | Freq: Once | ORAL | Status: AC
  Administered 2015-02-19: 2 via ORAL
  Filled 2015-02-19: qty 2

## 2015-02-19 MED ORDER — ZOLPIDEM TARTRATE 5 MG PO TABS
5.0000 mg | ORAL_TABLET | Freq: Once | ORAL | Status: AC
  Administered 2015-02-19: 5 mg via ORAL
  Filled 2015-02-19: qty 1

## 2015-02-19 NOTE — Consult Note (Signed)
WOC wound consult note Reason for Consult: left groin abscess.  I&D performed by urgent care MD yesterday.  Patient noted to having scarring in the bilateral axilla.  Questioned patient about history of hydradenitis which he confirms.  He has had since he was a young person, multiple I&D for this.  Some spontaneous draining at times.  Wound type: suspect hydradenitis related.  S/P I& D Measurement: small incision in the left groin, 0.5cm with 3cm tunnel at 7 o'clock  Wound QMV:HQIONG to visualize Drainage (amount, consistency, odor) thick, sanguinous  Periwound: intact, induration noted medially involving testicle. Not able to express drainage with palpation Dressing procedure/placement/frequency: 1/4" iodoform packing, making sure to pack tunnel.  Top with dry dressing. Change packing daily. Teach CG or patient to perform.   Discussed POC with patient and bedside nurse.  Re consult if needed, will not follow at this time. Thanks  [name], CWOCN [phone])

## 2015-02-19 NOTE — Progress Notes (Signed)
PROGRESS NOTE  Larry Houston ZOX:[id number] DOB: July 17, 1955 DOA: 02/18/2015 PCP: Lupe Carney, MD  HPI: PMH of hypertension, alcohol abuse and arthritis, who presents with pain in groin area and scrotum  Subjective / 24 H Interval events - Feeling much better today, pain significantly improved in the groin area, afebrile   Assessment/Plan: Principal Problem:   Abscess of groin Active Problems:   Essential hypertension   Alcohol use (HCC)   Thrombocytopenia (HCC)   Cellulitis of groin   Sepsis (HCC)   Scrotal abscess   Sepsis due to Groin abscess - Patient underwent a CT scan of the pelvis in the emergency room which showed extensive inflammatory changes without any definitive drainable fluid collections, without any soft tissue gas and reactive lymphadenopathy. Scrotal ultrasound without any drainable fluid collection as well - septic on admission with fever, tachycardia, source, sepsis physiology improved and he is afebrile this morning. Asking to go home.  - wound cultures pending   Hypertension  - Continue home medications   Alcohol abuse  - Continue CIWA   Thrombocytopenia - Stable, maybe related to #3     Diet: Diet Heart Room service appropriate?: Yes; Fluid consistency:: Thin Fluids: none  DVT Prophylaxis: Lovenox  Code Status: Full Code Family Communication: no family bedside  Disposition Plan: home 1-2 days  Barriers to discharge: IV therapies  Consultants:  None   Procedures:  None    Antibiotics Vancomycin 2/10 >> Aztreonam 2/10 >>   Studies  Ct Pelvis W Contrast  02/18/2015  CLINICAL DATA:  Swelling, painful scrotum with 2 areas of foul-smelling discharge in the scrotum and perineum. EXAM: CT PELVIS WITH CONTRAST TECHNIQUE: Multidetector CT imaging of the pelvis was performed using the standard protocol following the bolus administration of intravenous contrast. CONTRAST:  OMNIPAQUE IOHEXOL 300 MG/ML  SOLN COMPARISON:  None.  FINDINGS: Large amount soft tissue edema/ fluid about the scrotum and throughout the perineum. Tiny circumscribed fluid density collection at the posterior margin of the lower right scrotum (series 201, image 62) measures only 5 mm greatest dimension, possibly a tiny abscess by without air to confirm. Additional soft tissue thickening/phlegmon is seen at the inferomedial aspect of the left inguinal region (series 201, image 44), measuring approximately 2.4 cm greatest dimension, with punctate focus of associated air. No fluid density collection is appreciated in this area. No soft tissue gas within the scrotum or perineum to suggest Fournier gangrene. Numerous enlarged lymph nodes are seen within the inguinal regions bilaterally, likely reactive in nature. Visualized portion of the bowel, within the pelvis and lower abdomen, is normal in caliber and configuration. No bowel wall thickening or evidence of bowel wall inflammation. No free fluid or abscess within the intraperitoneal abdomen or pelvis. Prominent lymph nodes are seen within the iliac chain regions bilaterally, also likely reactive in nature. Incidental note made of a 2 mm stone within the bladder. Prostate gland is at least mildly enlarged causing slight mass effect on the bladder base. No acute osseous abnormality. IMPRESSION: 1. Large amount of soft tissue edema/fluid about the scrotum and throughout the perineum. Tiny circumscribed fluid density collection at the posterior margin of the lower right scrotum, measuring only 5 mm greatest dimension, perhaps a tiny abscess but no associated air or tract to confirm. 2. Additional soft tissue thickening/phlegmon at the inferomedial aspect of the left inguinal region, superficial in location, measuring approximately 2.4 cm greatest dimension, with single punctate focus of air. No fluid density collection appreciated in this  area. Additional clinical data states that there was a recent abscess incision,  possibly at this location given the punctate focus of air. 3. Overall, no drainable fluid collection identified. 4. No soft tissue gas about the scrotum or within the perineum to confirm a Fournier gangrene. 5. Numerous enlarged lymph nodes within the inguinal regions bilaterally, and within the iliac chain regions bilaterally, likely reactive in nature. 6. Additional chronic/incidental findings detailed above. Electronically Signed   By: Bary Richard M.D.   On: 02/18/2015 18:51   US Scrotum  02/18/2015  CLINICAL DATA:  60 year old male with scrotal pain and swelling. EXAM: SCROTAL ULTRASOUND DOPPLER ULTRASOUND OF THE TESTICLES TECHNIQUE: Complete ultrasound examination of the testicles, epididymis, and other scrotal structures was performed. Color and spectral Doppler ultrasound were also utilized to evaluate blood flow to the testicles. COMPARISON:  CT dated 02/18/2015 FINDINGS: Right testicle Measurements: 4.2 x 2.1 x 2.5 cm. No mass or microlithiasis visualized. Left testicle Measurements: 4.1 x 1.6 x 2.7 cm. No mass or microlithiasis visualized. There is bilateral scrotal wall edema. A 1.2 x 0.7 x 1.1 cm hyperechoic area with surrounding hypoechoic rim with no internal vascularity in the left right testicular wall is not well characterized but may represent a lymph node. No drainable fluid collection/abscess identified. Right epididymis:  Normal in size and appearance. Left epididymis:  There is an 8 mm left epididymal head cyst. Hydrocele:  None visualized. Varicocele:  None visualized. Pulsed Doppler interrogation of both testes demonstrates normal low resistance arterial and venous waveforms bilaterally. IMPRESSION: Unremarkable testicles. Diffuse scrotal wall edema.  No drainable fluid collection. Electronically Signed   By: Elgie Collard M.D.   On: 02/18/2015 21:44   [place] Art/ven Flow Abd Pelv Doppler  02/18/2015  CLINICAL DATA:  60 year old male with scrotal pain and swelling. EXAM: SCROTAL  ULTRASOUND DOPPLER ULTRASOUND OF THE TESTICLES TECHNIQUE: Complete ultrasound examination of the testicles, epididymis, and other scrotal structures was performed. Color and spectral Doppler ultrasound were also utilized to evaluate blood flow to the testicles. COMPARISON:  CT dated 02/18/2015 FINDINGS: Right testicle Measurements: 4.2 x 2.1 x 2.5 cm. No mass or microlithiasis visualized. Left testicle Measurements: 4.1 x 1.6 x 2.7 cm. No mass or microlithiasis visualized. There is bilateral scrotal wall edema. A 1.2 x 0.7 x 1.1 cm hyperechoic area with surrounding hypoechoic rim with no internal vascularity in the left right testicular wall is not well characterized but may represent a lymph node. No drainable fluid collection/abscess identified. Right epididymis:  Normal in size and appearance. Left epididymis:  There is an 8 mm left epididymal head cyst. Hydrocele:  None visualized. Varicocele:  None visualized. Pulsed Doppler interrogation of both testes demonstrates normal low resistance arterial and venous waveforms bilaterally. IMPRESSION: Unremarkable testicles. Diffuse scrotal wall edema.  No drainable fluid collection. Electronically Signed   By: Elgie Collard M.D.   On: 02/18/2015 21:44    Objective  Filed Vitals:   02/18/15 1938 02/18/15 2236 02/19/15 0606 02/19/15 1406  BP:  134/70 139/77 145/82  Pulse:  93 92 89  Temp: 102.6 F (39.2 C) 98.2 F (36.8 C) 98.7 F (37.1 C) 98.3 F (36.8 C)  TempSrc: Rectal Oral Oral Oral  Resp:  Height:   (1.854 m)    Weight:  106.595 kg (235 lb)    SpO2:  97% 97% 96%    Intake/Output Summary (Last 24 hours) at 02/19/15 1431 Last data filed at 02/19/15 1300  Gross per 24 hour  Intake 3773.25 ml  Output   1500 ml  Net 2273.25 ml   Filed Weights   02/18/15 1544 02/18/15 2236  Weight: 112.492 kg (248 lb) 106.595 kg (235 lb)    Exam:  GENERAL: NAD  HEENT: no scleral icterus, PERRL  NECK: supple, no LAD  LUNGS: CTA  biL, no wheezing  HEART: RRR without MRG  ABDOMEN: soft, non tender  EXTREMITIES: no clubbing / cyanosis  NEUROLOGIC: non focal  PSYCHIATRIC: normal mood and affect  SKIN: no rashes. Left medial thigh wound with packing, minimally tender to palpation, no erythema appreciated. Right wound medial thigh with packed wound is well   Data Reviewed: Basic Metabolic Panel:  Recent Labs Lab 02/18/15 1625 02/19/15 0141  NA 134* 137  K 3.6 3.7  CL 97* 102  CO2 27 26  GLUCOSE 129* 138*  BUN 7 7  CREATININE 1.02 0.96  CALCIUM 8.9 8.7*   Liver Function Tests:  Recent Labs Lab 02/18/15 1625  AST 24  ALT 17  ALKPHOS 63  BILITOT 1.0  PROT 7.8  ALBUMIN 3.5   CBC:  Recent Labs Lab 02/18/15 1625 02/19/15 0141  WBC 9.6 7.8  NEUTROABS 7.6  --   HGB 14.1 13.1  HCT 41.4 38.5*  MCV 92.8 92.8  PLT 142* 128*    Recent Results (from the past 240 hour(s))  Urine culture     Status: None (Preliminary result)   Collection Time: 02/18/15  4:00 PM  Result Value Ref Range Status   Specimen Description URINE, CLEAN CATCH  Final   Special Requests NONE  Final   Culture TOO YOUNG TO READ  Final   Report Status PENDING  Incomplete  Wound culture     Status: None (Preliminary result)   Collection Time: 02/18/15  4:08 PM  Result Value Ref Range Status   Specimen Description WOUND  Final   Special Requests RIGHT SCROTUM  Final   Gram Stain   Final    MODERATE WBC PRESENT,BOTH PMN AND MONONUCLEAR ABUNDANT GRAM NEGATIVE RODS MODERATE GRAM POSITIVE RODS FEW GRAM POSITIVE COCCI IN CLUSTERS Performed at [facility]    Culture   Final    Culture reincubated for better growth Performed at [facility]    Report Status PENDING  Incomplete     Scheduled Meds: . amLODipine  5 mg Oral QAC breakfast  . aztreonam  2 g Intravenous Q8H  . enoxaparin (LOVENOX) injection  40 mg Subcutaneous Q24H  . folic acid  1 mg Oral Daily  . LORazepam  0-4 mg Intravenous Q6H    Followed by  . [START ON 02/20/2015] LORazepam  0-4 mg Intravenous Q12H  . multivitamin with minerals  1 tablet Oral Daily  . sodium chloride flush  3 mL Intravenous Q12H  . thiamine  100 mg Oral Daily   Or  . thiamine  100 mg Intravenous Daily  . vancomycin  1,000 mg Intravenous Q12H   Continuous Infusions: . sodium chloride 1,000 mL (02/18/15 2227)    Pamella Pert, MD [facility] Pager [phone]. If 7 PM - 7 AM, please contact night-coverage at [email or web address], password [facility] 02/19/2015, 2:31 PM  LOS: 1 day

## 2015-02-20 LAB — URINE CULTURE: Culture: 5000

## 2015-02-20 LAB — WOUND CULTURE

## 2015-02-20 MED ORDER — DIPHENHYDRAMINE HCL 25 MG PO CAPS
25.0000 mg | ORAL_CAPSULE | Freq: Four times a day (QID) | ORAL | Status: DC | PRN
  Administered 2015-02-20: 25 mg via ORAL
  Filled 2015-02-20: qty 1

## 2015-02-20 MED ORDER — DOXYCYCLINE HYCLATE 100 MG PO CAPS: 100.0000 mg | ORAL_CAPSULE | Freq: Two times a day (BID) | ORAL | Status: DC

## 2015-02-20 NOTE — Progress Notes (Signed)
Nsg Discharge Note  Admit Date:  02/18/2015 Discharge date: 02/20/2015   Larry Houston to be D/C'd Home per MD order.  AVS completed.  Copy for chart, and copy for patient signed, and dated. Patient/caregiver able to verbalize understanding.  Discharge Medication:   Medication List    STOP taking these medications        oxyCODONE-acetaminophen 5-325 MG tablet  Commonly known as:  PERCOCET/ROXICET     sulfamethoxazole-trimethoprim 800-160 MG tablet  Commonly known as:  BACTRIM DS,SEPTRA DS      TAKE these medications        acetaminophen 500 MG tablet  Commonly known as:  TYLENOL  Take 500 mg by mouth every 6 (six) hours as needed for mild pain.     amLODipine 5 MG tablet  Commonly known as:  NORVASC  Take 5 mg by mouth daily before breakfast.     cyclobenzaprine 10 MG tablet  Commonly known as:  FLEXERIL  Take 1 tablet (10 mg total) by mouth 3 (three) times daily as needed for muscle spasms.     doxycycline 100 MG capsule  Commonly known as:  VIBRAMYCIN  Take 1 capsule (100 mg total) by mouth 2 (two) times daily.     econazole nitrate 1 % cream  Apply 1 application topically daily as needed (affected area).     HYDROcodone-acetaminophen 5-325 MG tablet  Commonly known as:  NORCO/VICODIN  Take 2 tablets by mouth every 4 (four) hours as needed.        Discharge Assessment: Filed Vitals:   02/20/15 0045 02/20/15 0459  BP: 131/85 143/82  Pulse: 81 89  Temp: 98.4 F (36.9 C) 98.5 F (36.9 C)  Resp: 18 18   Skin clean, dry and intact without evidence of skin break down, no evidence of skin tears noted. IV catheter discontinued intact. Site without signs and symptoms of complications - no redness or edema noted at insertion site, patient denies c/o pain - only slight tenderness at site.  Dressing with slight pressure applied.  D/c Instructions-Education: Discharge instructions given to patient/family with verbalized understanding. D/c education completed with  patient/family including follow up instructions, medication list, d/c activities limitations if indicated, with other d/c instructions as indicated by MD - patient able to verbalize understanding, all questions fully answered. Wound dressing changed and cleaned prior to discharge.  Patient instructed to return to ED, call 911, or call MD for any changes in condition.  Patient escorted via WC, and D/C home via private auto.  Kern Reap, RN 02/20/2015 11:33 AM

## 2015-02-20 NOTE — Discharge Summary (Signed)
Physician Discharge Summary  KEYDEN PAVLOV WUJ:[id number] DOB: July 30, 1955 DOA: 02/18/2015  PCP: Lupe Carney, MD  Admit date: 02/18/2015 Discharge date: 02/20/2015  Time spent: > 30 minutes  Recommendations for Outpatient Follow-up:  1. Follow up with Dr. Clovis Riley in 1 week   Discharge Diagnoses:  Principal Problem:   Abscess of groin Active Problems:   Essential hypertension   Alcohol use (HCC)   Thrombocytopenia (HCC)   Cellulitis of groin   Sepsis (HCC)   Scrotal abscess  Discharge Condition: stable  Diet recommendation: regular  Filed Weights   02/18/15 1544 02/18/15 2236  Weight: 112.492 kg (248 lb) 106.595 kg (235 lb)    History of present illness:  Larry Houston is a 60 y.o. male with PMH of hypertension, alcohol abuse and arthritis, who presents with pain in groin area and scrotum. Pt reports that he had an abscess in left groin area. He was seen by urgent care. He underwent I&D and packed yesterday at urgent care. He was prescribed Bactrim and pain meds. He states that he continues to have pain, which is 10/10, sharp, and nonradiating. The pain is also involving right bottom of scrotum. No penile discharge, penile pain or penile swelling. No difficulty urination or symptoms of UTI. Patient does not have fever, but has chills, no nausea, vomiting, abdominal pain, diarrhea, chest pain, shortness breath, unilateral weakness. In ED, patient was found to have WBC 9.6, negative urinalysis, temperature 100.3, tachycardia, electrolytes and renal function okay. Patient's admitted to inpatient for further interventional treatment.  Hospital Course:  Sepsis due to Groin abscess - Patient underwent a CT scan of the pelvis in the emergency room which showed extensive inflammatory changes without any definitive drainable fluid collections, without any soft tissue gas and reactive lymphadenopathy. Scrotal ultrasound without any drainable fluid collection as well. He was septic on  admission with fever, tachycardia, source. He was placed on IV antibiotics with vancomycin and posterior in am, he improved rapidly and was afebrile while hospitalized. Microbiology obtained looked like skin flora without any predominant strep or staph isolated (I discussed with the microbiology lab on the day of discharge). Patient's antibiotics will be narrowed to doxycycline and he will be discharged home to complete an additional week of antibiotics. Wound care was consulted and provided education to the patient about dressing changes and packing at home, he expressed understanding. Hypertension  - Continue home medications  Alcohol abuse - did not trigger CIWA on the day of discharge Thrombocytopenia - Stable, likely related to #3, when repeat CBC is normal outpatient  Procedures:  None    Consultations:  None   Discharge Exam: Filed Vitals:   02/19/15 1406 02/19/15 1750 02/20/15 0045 02/20/15 0459  BP: 145/82 141/82 131/85 143/82  Pulse: 89 84 81 89  Temp: 98.3 F (36.8 C) 98.4 F (36.9 C) 98.4 F (36.9 C) 98.5 F (36.9 C)  TempSrc: Oral Oral Oral Oral  Resp: 20 20 18 18   Height:      Weight:      SpO2: 96% 97% 98% 97%    General: NAD Cardiovascular: RRR Respiratory: CTA biL  Discharge Instructions Activity:  As tolerated   Get Medicines reviewed and adjusted: Please take all your medications with you for your next visit with your Primary MD  Please request your Primary MD to go over all hospital tests and procedure/radiological results at the follow up, please ask your Primary MD to get all Hospital records sent to his/her office.  If  you experience worsening of your admission symptoms, develop shortness of breath, life threatening emergency, suicidal or homicidal thoughts you must seek medical attention immediately by calling 911 or calling your MD immediately if symptoms less severe.  You must read complete instructions/literature along with all the possible  adverse reactions/side effects for all the Medicines you take and that have been prescribed to you. Take any new Medicines after you have completely understood and accpet all the possible adverse reactions/side effects.   Do not drive when taking Pain medications.   Do not take more than prescribed Pain, Sleep and Anxiety Medications  Special Instructions: If you have smoked or chewed Tobacco in the last 2 yrs please stop smoking, stop any regular Alcohol and or any Recreational drug use.  Wear Seat belts while driving.  Please note  You were cared for by a hospitalist during your hospital stay. Once you are discharged, your primary care physician will handle any further medical issues. Please note that NO REFILLS for any discharge medications will be authorized once you are discharged, as it is imperative that you return to your primary care physician (or establish a relationship with a primary care physician if you do not have one) for your aftercare needs so that they can reassess your need for medications and monitor your lab values.    Medication List    STOP taking these medications        oxyCODONE-acetaminophen 5-325 MG tablet  Commonly known as:  PERCOCET/ROXICET     sulfamethoxazole-trimethoprim 800-160 MG tablet  Commonly known as:  BACTRIM DS,SEPTRA DS      TAKE these medications        acetaminophen 500 MG tablet  Commonly known as:  TYLENOL  Take 500 mg by mouth every 6 (six) hours as needed for mild pain.     amLODipine 5 MG tablet  Commonly known as:  NORVASC  Take 5 mg by mouth daily before breakfast.     cyclobenzaprine 10 MG tablet  Commonly known as:  FLEXERIL  Take 1 tablet (10 mg total) by mouth 3 (three) times daily as needed for muscle spasms.     doxycycline 100 MG capsule  Commonly known as:  VIBRAMYCIN  Take 1 capsule (100 mg total) by mouth 2 (two) times daily.     econazole nitrate 1 % cream  Apply 1 application topically daily as needed  (affected area).     HYDROcodone-acetaminophen 5-325 MG tablet  Commonly known as:  NORCO/VICODIN  Take 2 tablets by mouth every 4 (four) hours as needed.           Follow-up Information    Follow up with Lupe Carney, MD.   Specialty:  Family Medicine   Why:  As soon as possible for follow up   Contact information:   [address] [place] [address] [phone]       Follow up with [facility] EMERGENCY DEPARTMENT.   Specialty:  Emergency Medicine   Why:  As needed, If symptoms worsen   Contact information:   [address] [id number] mc [place] [address] [phone]      Follow up with Lupe Carney, MD. Schedule an appointment as soon as possible for a visit in 1 week.   Specialty:  Family Medicine   Contact information:   [address] [place] [address] [phone]       The results of significant diagnostics from this hospitalization (  including imaging, microbiology, ancillary and laboratory) are listed below for reference.    Significant Diagnostic Studies: Ct Pelvis W Contrast  02/18/2015  CLINICAL DATA:  Swelling, painful scrotum with 2 areas of foul-smelling discharge in the scrotum and perineum. EXAM: CT PELVIS WITH CONTRAST TECHNIQUE: Multidetector CT imaging of the pelvis was performed using the standard protocol following the bolus administration of intravenous contrast. CONTRAST:  OMNIPAQUE IOHEXOL 300 MG/ML  SOLN COMPARISON:  None. FINDINGS: Large amount soft tissue edema/ fluid about the scrotum and throughout the perineum. Tiny circumscribed fluid density collection at the posterior margin of the lower right scrotum (series 201, image 62) measures only 5 mm greatest dimension, possibly a tiny abscess by without air to confirm. Additional soft tissue thickening/phlegmon is seen at the inferomedial aspect of the left inguinal region (series 201, image 44), measuring  approximately 2.4 cm greatest dimension, with punctate focus of associated air. No fluid density collection is appreciated in this area. No soft tissue gas within the scrotum or perineum to suggest Fournier gangrene. Numerous enlarged lymph nodes are seen within the inguinal regions bilaterally, likely reactive in nature. Visualized portion of the bowel, within the pelvis and lower abdomen, is normal in caliber and configuration. No bowel wall thickening or evidence of bowel wall inflammation. No free fluid or abscess within the intraperitoneal abdomen or pelvis. Prominent lymph nodes are seen within the iliac chain regions bilaterally, also likely reactive in nature. Incidental note made of a 2 mm stone within the bladder. Prostate gland is at least mildly enlarged causing slight mass effect on the bladder base. No acute osseous abnormality. IMPRESSION: 1. Large amount of soft tissue edema/fluid about the scrotum and throughout the perineum. Tiny circumscribed fluid density collection at the posterior margin of the lower right scrotum, measuring only 5 mm greatest dimension, perhaps a tiny abscess but no associated air or tract to confirm. 2. Additional soft tissue thickening/phlegmon at the inferomedial aspect of the left inguinal region, superficial in location, measuring approximately 2.4 cm greatest dimension, with single punctate focus of air. No fluid density collection appreciated in this area. Additional clinical data states that there was a recent abscess incision, possibly at this location given the punctate focus of air. 3. Overall, no drainable fluid collection identified. 4. No soft tissue gas about the scrotum or within the perineum to confirm a Fournier gangrene. 5. Numerous enlarged lymph nodes within the inguinal regions bilaterally, and within the iliac chain regions bilaterally, likely reactive in nature. 6. Additional chronic/incidental findings detailed above. Electronically Signed   By: Bary Richard M.D.   On: 02/18/2015 18:51   US Scrotum  02/18/2015  CLINICAL DATA:  60 year old male with scrotal pain and swelling. EXAM: SCROTAL ULTRASOUND DOPPLER ULTRASOUND OF THE TESTICLES TECHNIQUE: Complete ultrasound examination of the testicles, epididymis, and other scrotal structures was performed. Color and spectral Doppler ultrasound were also utilized to evaluate blood flow to the testicles. COMPARISON:  CT dated 02/18/2015 FINDINGS: Right testicle Measurements: 4.2 x 2.1 x 2.5 cm. No mass or microlithiasis visualized. Left testicle Measurements: 4.1 x 1.6 x 2.7 cm. No mass or microlithiasis visualized. There is bilateral scrotal wall edema. A 1.2 x 0.7 x 1.1 cm hyperechoic area with surrounding hypoechoic rim with no internal vascularity in the left right testicular wall is not well characterized but may represent a lymph node. No drainable fluid collection/abscess identified. Right epididymis:  Normal in size and appearance. Left epididymis:  There is an 8 mm left epididymal head  cyst. Hydrocele:  None visualized. Varicocele:  None visualized. Pulsed Doppler interrogation of both testes demonstrates normal low resistance arterial and venous waveforms bilaterally. IMPRESSION: Unremarkable testicles. Diffuse scrotal wall edema.  No drainable fluid collection. Electronically Signed   By: Elgie Collard M.D.   On: 02/18/2015 21:44   Dg Lumbar Spine 1 View  01/26/2015  CLINICAL DATA:  L4-5 laminectomy and foraminotomy EXAM: LUMBAR SPINE - 1 VIEW COMPARISON:  MRI 08/04/2014 FINDINGS: Single cross-table lateral view of the lumbar spine demonstrates posterior surgical instrument directed at the level of the L5 pedicles. IMPRESSION: Intraoperative localization as above. Electronically Signed   By: Charlett Nose M.D.   On: 01/26/2015 11:19   [place] Art/ven Flow Abd Pelv Doppler  02/18/2015  CLINICAL DATA:  60 year old male with scrotal pain and swelling. EXAM: SCROTAL ULTRASOUND DOPPLER ULTRASOUND OF THE  TESTICLES TECHNIQUE: Complete ultrasound examination of the testicles, epididymis, and other scrotal structures was performed. Color and spectral Doppler ultrasound were also utilized to evaluate blood flow to the testicles. COMPARISON:  CT dated 02/18/2015 FINDINGS: Right testicle Measurements: 4.2 x 2.1 x 2.5 cm. No mass or microlithiasis visualized. Left testicle Measurements: 4.1 x 1.6 x 2.7 cm. No mass or microlithiasis visualized. There is bilateral scrotal wall edema. A 1.2 x 0.7 x 1.1 cm hyperechoic area with surrounding hypoechoic rim with no internal vascularity in the left right testicular wall is not well characterized but may represent a lymph node. No drainable fluid collection/abscess identified. Right epididymis:  Normal in size and appearance. Left epididymis:  There is an 8 mm left epididymal head cyst. Hydrocele:  None visualized. Varicocele:  None visualized. Pulsed Doppler interrogation of both testes demonstrates normal low resistance arterial and venous waveforms bilaterally. IMPRESSION: Unremarkable testicles. Diffuse scrotal wall edema.  No drainable fluid collection. Electronically Signed   By: Elgie Collard M.D.   On: 02/18/2015 21:44    Microbiology: Recent Results (from the past 240 hour(s))  Urine culture     Status: None   Collection Time: 02/18/15  4:00 PM  Result Value Ref Range Status   Specimen Description URINE, CLEAN CATCH  Final   Special Requests NONE  Final   Culture 5,000 COLONIES/mL INSIGNIFICANT GROWTH  Final   Report Status 02/20/2015 FINAL  Final  Wound culture     Status: None   Collection Time: 02/18/15  4:08 PM  Result Value Ref Range Status   Specimen Description WOUND  Final   Special Requests RIGHT SCROTUM  Final   Gram Stain   Final    MODERATE WBC PRESENT,BOTH PMN AND MONONUCLEAR ABUNDANT GRAM NEGATIVE RODS MODERATE GRAM POSITIVE RODS FEW GRAM POSITIVE COCCI IN CLUSTERS Performed at [facility]    Culture   Final    MULTIPLE  ORGANISMS PRESENT, NONE PREDOMINANT NO STAPHYLOCOCCUS AUREUS ISOLATED Performed at [facility]    Report Status 02/20/2015 FINAL  Final  Culture, blood (routine x 2)     Status: None (Preliminary result)   Collection Time: 02/18/15  7:40 PM  Result Value Ref Range Status   Specimen Description BLOOD RIGHT ANTECUBITAL  Final   Special Requests BOTTLES DRAWN AEROBIC AND ANAEROBIC 10CC  Final   Culture NO GROWTH 1 DAY  Final   Report Status PENDING  Incomplete  Culture, blood (routine x 2)     Status: None (Preliminary result)   Collection Time: 02/18/15  7:45 PM  Result Value Ref Range Status   Specimen Description BLOOD RIGHT HAND  Final  Special Requests BOTTLES DRAWN AEROBIC AND ANAEROBIC 10CC  Final   Culture NO GROWTH 1 DAY  Final   Report Status PENDING  Incomplete     Labs: Basic Metabolic Panel:  Recent Labs Lab 02/18/15 1625 02/19/15 0141  NA 134* 137  K 3.6 3.7  CL 97* 102  CO2 27 26  GLUCOSE 129* 138*  BUN 7 7  CREATININE 1.02 0.96  CALCIUM 8.9 8.7*   Liver Function Tests:  Recent Labs Lab 02/18/15 1625  AST 24  ALT 17  ALKPHOS 63  BILITOT 1.0  PROT 7.8  ALBUMIN 3.5   No results for input(s): LIPASE, AMYLASE in the last 168 hours. No results for input(s): AMMONIA in the last 168 hours. CBC:  Recent Labs Lab 02/18/15 1625 02/19/15 0141  WBC 9.6 7.8  NEUTROABS 7.6  --   HGB 14.1 13.1  HCT 41.4 38.5*  MCV 92.8 92.8  PLT 142* 128*    Signed:  GHERGHE, COSTIN  [facility] 02/20/2015, 1:10 PM

## 2015-02-20 NOTE — Discharge Instructions (Signed)
Follow with Dean Mitchell, MD in 5-7 days ° °Please get a complete blood count and chemistry panel checked by your Primary MD at your next visit, and again as instructed by your Primary MD. Please get your medications reviewed and adjusted by your Primary MD. ° °Please request your Primary MD to go over all Hospital Tests and Procedure/Radiological results at the follow up, please get all Hospital records sent to your Prim MD by signing hospital release before you go home. ° °If you had Pneumonia of Lung problems at the Hospital: °Please get a 2 view Chest X ray done in 6-8 weeks after hospital discharge or sooner if instructed by your Primary MD. ° °If you have Congestive Heart Failure: °Please call your Cardiologist or Primary MD anytime you have any of the following symptoms:  °1) 3 pound weight gain in 24 hours or 5 pounds in 1 week  °2) shortness of breath, with or without a dry hacking cough  °3) swelling in the hands, feet or stomach  °4) if you have to sleep on extra pillows at night in order to breathe ° °Follow cardiac low salt diet and 1.5 lit/day fluid restriction. ° °If you have diabetes °Accuchecks 4 times/day, Once in AM empty stomach and then before each meal. °Log in all results and show them to your primary doctor at your next visit. °If any glucose reading is under 80 or above 300 call your primary MD immediately. ° °If you have Seizure/Convulsions/Epilepsy: °Please do not drive, operate heavy machinery, participate in activities at heights or participate in high speed sports until you have seen by Primary MD or a Neurologist and advised to do so again. ° °If you had Gastrointestinal Bleeding: °Please ask your Primary MD to check a complete blood count within one week of discharge or at your next visit. Your endoscopic/colonoscopic biopsies that are pending at the time of discharge, will also need to followed by your Primary MD. ° °Get Medicines reviewed and adjusted. °Please take all your  medications with you for your next visit with your Primary MD ° °Please request your Primary MD to go over all hospital tests and procedure/radiological results at the follow up, please ask your Primary MD to get all Hospital records sent to his/her office. ° °If you experience worsening of your admission symptoms, develop shortness of breath, life threatening emergency, suicidal or homicidal thoughts you must seek medical attention immediately by calling 911 or calling your MD immediately  if symptoms less severe. ° °You must read complete instructions/literature along with all the possible adverse reactions/side effects for all the Medicines you take and that have been prescribed to you. Take any new Medicines after you have completely understood and accpet all the possible adverse reactions/side effects.  ° °Do not drive or operate heavy machinery when taking Pain medications.  ° °Do not take more than prescribed Pain, Sleep and Anxiety Medications ° °Special Instructions: If you have smoked or chewed Tobacco  in the last 2 yrs please stop smoking, stop any regular Alcohol  and or any Recreational drug use. ° °Wear Seat belts while driving. ° °Please note °You were cared for by a hospitalist during your hospital stay. If you have any questions about your discharge medications or the care you received while you were in the hospital after you are discharged, you can call the unit and asked to speak with the hospitalist on call if the hospitalist that took care of you is not available. Once   you are discharged, your primary care physician will handle any further medical issues. Please note that NO REFILLS for any discharge medications will be authorized once you are discharged, as it is imperative that you return to your primary care physician (or establish a relationship with a primary care physician if you do not have one) for your aftercare needs so that they can reassess your need for medications and monitor your  lab values. ° °You can reach the hospitalist office at phone [phone] or fax [phone] °  °If you do not have a primary care physician, you can call [phone] for a physician referral. ° °Activity: As tolerated with Full fall precautions use walker/cane & assistance as needed ° °Diet: regular ° °Disposition Home ° ° °

## 2015-02-21 LAB — HEMOGLOBIN A1C
HEMOGLOBIN A1C: 6.7 % — AB (ref 4.8–5.6)
Mean Plasma Glucose: 146 mg/dL

## 2015-02-24 LAB — CULTURE, BLOOD (ROUTINE X 2)
CULTURE: NO GROWTH
Culture: NO GROWTH

## 2015-03-03 DIAGNOSIS — L720 Epidermal cyst: Secondary | ICD-10-CM | POA: Diagnosis not present

## 2015-03-15 DIAGNOSIS — L720 Epidermal cyst: Secondary | ICD-10-CM | POA: Diagnosis not present

## 2015-03-15 MED FILL — MUPIROCIN 2% OINTMENT: 2 | 7 days supply | Qty: 22 | Fill #0

## 2015-03-15 MED FILL — GABAPENTIN 300 MG CAPSULE: 300 | 90 days supply | Qty: 90 | Fill #0

## 2015-03-22 DIAGNOSIS — L720 Epidermal cyst: Secondary | ICD-10-CM | POA: Diagnosis not present

## 2015-03-22 DIAGNOSIS — L280 Lichen simplex chronicus: Secondary | ICD-10-CM | POA: Diagnosis not present

## 2015-03-22 MED FILL — DESONIDE 0.05% OINTMENT: 0.05 | 10 days supply | Qty: 15 | Fill #0

## 2015-04-01 MED FILL — AMLODIPINE BESYLATE 5 MG TA: 5 | 90 days supply | Qty: 90 | Fill #0

## 2015-04-05 MED FILL — OXYCODONE/APAP 5-325: 5-325 | 15 days supply | Qty: 60 | Fill #0

## 2015-04-07 MED FILL — DESONIDE 0.05% OINTMENT: 0.05 | 8 days supply | Qty: 15 | Fill #0

## 2015-05-05 MED FILL — DESONIDE 0.05% OINTMENT: 0.05 | 8 days supply | Qty: 15 | Fill #1

## 2015-05-27 MED FILL — OXYCODONE/APAP 5-325: 5-325 | 15 days supply | Qty: 60 | Fill #0

## 2015-06-07 DIAGNOSIS — I10 Essential (primary) hypertension: Secondary | ICD-10-CM | POA: Diagnosis not present

## 2015-06-07 DIAGNOSIS — I868 Varicose veins of other specified sites: Secondary | ICD-10-CM | POA: Diagnosis not present

## 2015-06-07 DIAGNOSIS — M79606 Pain in leg, unspecified: Secondary | ICD-10-CM | POA: Diagnosis not present

## 2015-06-07 DIAGNOSIS — R350 Frequency of micturition: Secondary | ICD-10-CM | POA: Diagnosis not present

## 2015-06-08 MED FILL — DESONIDE 0.05% OINTMENT: 0.05 | 8 days supply | Qty: 15 | Fill #2

## 2015-06-28 MED FILL — DESONIDE 0.05% OINTMENT: 0.05 | 10 days supply | Qty: 15 | Fill #0

## 2015-07-04 MED FILL — GABAPENTIN 300 MG CAPSULE: 300 | 90 days supply | Qty: 90 | Fill #1

## 2015-07-04 MED FILL — AMLODIPINE BESYLATE 5 MG TA: 5 | 90 days supply | Qty: 90 | Fill #0

## 2015-07-15 DIAGNOSIS — M4806 Spinal stenosis, lumbar region: Secondary | ICD-10-CM | POA: Diagnosis not present

## 2015-07-15 MED FILL — OXYCODONE/APAP 5-325: 5-325 | 15 days supply | Qty: 60 | Fill #0

## 2015-07-20 DIAGNOSIS — M4806 Spinal stenosis, lumbar region: Secondary | ICD-10-CM | POA: Diagnosis not present

## 2015-07-28 MED FILL — DESONIDE 0.05% OINTMENT: 0.05 | 10 days supply | Qty: 15 | Fill #1

## 2015-08-16 MED FILL — DESONIDE 0.05% OINTMENT: 0.05 | 10 days supply | Qty: 15 | Fill #2

## 2015-09-28 MED FILL — DESONIDE 0.05% OINTMENT: 0.05 | 30 days supply | Qty: 60 | Fill #0

## 2015-09-28 MED FILL — AMLODIPINE BESYLATE 5 MG TA: 5 | 90 days supply | Qty: 90 | Fill #0

## 2015-11-03 ENCOUNTER — Telehealth (INDEPENDENT_AMBULATORY_CARE_PROVIDER_SITE_OTHER): Payer: Self-pay | Admitting: *Deleted

## 2015-11-03 MED ORDER — GABAPENTIN 300 MG PO CAPS: 300.0000 mg | ORAL_CAPSULE | Freq: Three times a day (TID) | ORAL | 3 refills | Status: DC

## 2015-11-03 MED FILL — GABAPENTIN 300 MG CAPSULE: 300 | 30 days supply | Qty: 90 | Fill #0

## 2015-11-03 NOTE — Telephone Encounter (Signed)
I did refill but in future needs to get from PCP

## 2015-11-03 NOTE — Telephone Encounter (Signed)
Pt. Called requesting refill for gabapentin. Callback number is [phone]

## 2015-12-21 ENCOUNTER — Other Ambulatory Visit: Payer: Self-pay | Admitting: *Deleted

## 2015-12-21 DIAGNOSIS — I7025 Atherosclerosis of native arteries of other extremities with ulceration: Secondary | ICD-10-CM

## 2015-12-22 ENCOUNTER — Ambulatory Visit (HOSPITAL_COMMUNITY)
Admission: RE | Admit: 2015-12-22 | Discharge: 2015-12-22 | Disposition: A | Discharge status: Home / Self Care | Payer: 59 | Source: Ambulatory Visit | Attending: Vascular Surgery | Admitting: Vascular Surgery

## 2015-12-22 ENCOUNTER — Encounter: Payer: Self-pay | Admitting: Surgery

## 2015-12-22 DIAGNOSIS — Z720 Tobacco use: Secondary | ICD-10-CM | POA: Insufficient documentation

## 2015-12-22 DIAGNOSIS — I10 Essential (primary) hypertension: Secondary | ICD-10-CM | POA: Insufficient documentation

## 2015-12-22 DIAGNOSIS — I7025 Atherosclerosis of native arteries of other extremities with ulceration: Secondary | ICD-10-CM | POA: Insufficient documentation

## 2015-12-23 ENCOUNTER — Ambulatory Visit: Payer: 59 | Admitting: Surgery

## 2015-12-27 MED FILL — DESONIDE 0.05% OINTMENT: 0.05 | 15 days supply | Qty: 60 | Fill #0

## 2015-12-30 ENCOUNTER — Encounter: Payer: Self-pay | Admitting: Surgery

## 2016-01-04 MED FILL — ETODOLAC 400 MG TABLET: 400 | 30 days supply | Qty: 60 | Fill #0

## 2016-01-10 MED FILL — AMLODIPINE BESYLATE 5 MG TA: 5 | 90 days supply | Qty: 90 | Fill #0

## 2016-01-16 ENCOUNTER — Ambulatory Visit: Payer: 59 | Admitting: Surgery

## 2016-01-19 ENCOUNTER — Encounter: Payer: Self-pay | Admitting: Surgery

## 2016-01-19 DIAGNOSIS — L259 Unspecified contact dermatitis, unspecified cause: Secondary | ICD-10-CM | POA: Diagnosis not present

## 2016-01-19 DIAGNOSIS — J069 Acute upper respiratory infection, unspecified: Secondary | ICD-10-CM | POA: Diagnosis not present

## 2016-01-19 DIAGNOSIS — I10 Essential (primary) hypertension: Secondary | ICD-10-CM | POA: Diagnosis not present

## 2016-01-19 MED FILL — FLUOCINONIDE 0.05% CREAM: 0.05 | 10 days supply | Qty: 30 | Fill #0

## 2016-01-19 MED FILL — PROMETHAZINE-CODEINE SYRUP: 6.25-10 | 4 days supply | Qty: 120 | Fill #0

## 2016-01-23 ENCOUNTER — Ambulatory Visit: Payer: 59 | Admitting: Surgery

## 2016-01-30 ENCOUNTER — Other Ambulatory Visit: Payer: Self-pay | Admitting: *Deleted

## 2016-01-30 ENCOUNTER — Other Ambulatory Visit: Payer: Self-pay | Admitting: Internal Medicine

## 2016-01-30 DIAGNOSIS — R2241 Localized swelling, mass and lump, right lower limb: Secondary | ICD-10-CM

## 2016-02-06 ENCOUNTER — Encounter: Payer: Self-pay | Admitting: Surgery

## 2016-02-09 ENCOUNTER — Ambulatory Visit (HOSPITAL_COMMUNITY): Admission: RE | Admit: 2016-02-09 | Payer: 59 | Source: Ambulatory Visit

## 2016-02-09 ENCOUNTER — Ambulatory Visit (HOSPITAL_COMMUNITY)
Admission: RE | Admit: 2016-02-09 | Discharge: 2016-02-09 | Disposition: A | Discharge status: Home / Self Care | Payer: 59 | Source: Ambulatory Visit | Attending: Surgery | Admitting: Surgery

## 2016-02-09 DIAGNOSIS — Q279 Congenital malformation of peripheral vascular system, unspecified: Secondary | ICD-10-CM | POA: Insufficient documentation

## 2016-02-09 DIAGNOSIS — R2241 Localized swelling, mass and lump, right lower limb: Secondary | ICD-10-CM

## 2016-02-09 MED ORDER — GADOBENATE DIMEGLUMINE 529 MG/ML IV SOLN
20.0000 mL | Freq: Once | INTRAVENOUS | Status: AC | PRN
  Administered 2016-02-09: 20 mL via INTRAVENOUS

## 2016-02-10 LAB — POCT I-STAT CREATININE: CREATININE: 0.9 mg/dL (ref 0.61–1.24)

## 2016-02-13 ENCOUNTER — Ambulatory Visit: Payer: 59 | Admitting: Surgery

## 2016-03-01 ENCOUNTER — Other Ambulatory Visit: Payer: Self-pay | Admitting: *Deleted

## 2016-03-01 DIAGNOSIS — Q279 Congenital malformation of peripheral vascular system, unspecified: Secondary | ICD-10-CM

## 2016-03-02 ENCOUNTER — Other Ambulatory Visit: Payer: Self-pay | Admitting: Surgery

## 2016-03-02 ENCOUNTER — Telehealth: Payer: Self-pay | Admitting: Surgery

## 2016-03-02 DIAGNOSIS — Q279 Congenital malformation of peripheral vascular system, unspecified: Secondary | ICD-10-CM

## 2016-03-02 NOTE — Telephone Encounter (Signed)
-----   Message from Sharee PimpleMarilyn K McChesney, RN sent at 03/01/2016  3:03 PM EST ----- Regarding: 2 weeks Int.Radiology referral Dr. 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  I have discussed case with him. Please notify patient of appointment date and time within 2 weeks.  Thanks

## 2016-03-02 NOTE — Telephone Encounter (Signed)
Scheduled for 3/14 @9 :15am (Soonest available). Left message for patient.

## 2016-03-21 ENCOUNTER — Other Ambulatory Visit: Payer: 59

## 2016-04-04 ENCOUNTER — Telehealth (INDEPENDENT_AMBULATORY_CARE_PROVIDER_SITE_OTHER): Payer: Self-pay | Admitting: Physical Medicine and Rehabilitation

## 2016-04-04 MED FILL — DESONIDE 0.05% OINTMENT: 0.05 | 15 days supply | Qty: 60 | Fill #0

## 2016-04-04 MED FILL — AMLODIPINE BESYLATE 5 MG TA: 5 | 90 days supply | Qty: 90 | Fill #1

## 2016-04-04 MED FILL — ETODOLAC 400 MG TABLET: 400 | 30 days supply | Qty: 60 | Fill #0

## 2016-04-04 NOTE — Telephone Encounter (Signed)
Patient called requesting a refill of his Gabapentin.  He uses [facility].  CB#[phone].  Thank you.

## 2016-04-04 NOTE — Telephone Encounter (Signed)
Please advise 

## 2016-04-05 ENCOUNTER — Ambulatory Visit
Admission: RE | Admit: 2016-04-05 | Discharge: 2016-04-05 | Disposition: A | Discharge status: Home / Self Care | Payer: 59 | Source: Ambulatory Visit | Attending: Surgery | Admitting: Surgery

## 2016-04-05 DIAGNOSIS — Q279 Congenital malformation of peripheral vascular system, unspecified: Secondary | ICD-10-CM | POA: Diagnosis not present

## 2016-04-05 HISTORY — PX: IR RADIOLOGIST EVAL & MGMT: IMG5224

## 2016-04-05 MED ORDER — GABAPENTIN 300 MG PO CAPS: 300.0000 mg | ORAL_CAPSULE | Freq: Three times a day (TID) | ORAL | 1 refills | Status: DC

## 2016-04-05 MED FILL — GABAPENTIN 300 MG CAPSULE: 300 | 30 days supply | Qty: 90 | Fill #0

## 2016-04-05 NOTE — Telephone Encounter (Signed)
Called patient and left message to advise. 

## 2016-04-05 NOTE — Consult Note (Signed)
Chief Complaint: Patient was seen in consultation today for  Chief Complaint  Patient presents with  . Advice Only    Consult for venous malformation of Right medial calf   at the request of Brabham,Vance W  Referring Physician(s): Brabham,Vance W  History of Present Illness: Larry Houston is a 61 y.o. 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  He initially thought these were simple varicose veins and began wearing compression hose, however this offered no benefit. The region is very focal and has continued to enlarge and is now becoming symptomatic, particularly after a day of standing on his feet. 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  He presents today to discuss percutaneous sclerotherapy.  He denies issues with prior DVT, chest pain, shortness of breath or other systemic symptoms. He has not experienced any spontaneous bleeding from this lesion.  Past Medical History:  Diagnosis Date  . Alcohol abuse   . Arthritis    lumbar stenosis   . Hypertension     Past Surgical History:  Procedure Laterality Date  . LUMBAR LAMINECTOMY/DECOMPRESSION MICRODISCECTOMY Bilateral 01/26/2015   Procedure: Laminectomy and Foraminotomy - L4-L5 - bilateral;  Surgeon: Tia Alertavid S Jones, MD;  Location: [facility];  Service: Neurosurgery;  Laterality: Bilateral;  Laminectomy and Foraminotomy - L4-L5 - bilateral  . NASAL ENDOSCOPY WITH EPISTAXIS CONTROL N/A 04/04/2014   Procedure: NASAL ENDOSCOPY WITH EPISTAXIS CONTROL;  Surgeon: Melvenia BeamMitchell Gore, MD;  [facility];  Service: ENT;  Laterality: N/A;  . NECK SURGERY  1992   posterior fusion- cervical     Allergies: Penicillins  Medications: Prior to Admission medications   Medication Sig Start Date End Date Taking? Authorizing Provider  acetaminophen (TYLENOL) 500 MG tablet Take 500 mg by mouth every 6 (six) hours as needed for mild  pain.   Yes Historical Provider, MD  amLODipine (NORVASC) 5 MG tablet Take 5 mg by mouth daily before breakfast.    Yes Historical Provider, MD  doxycycline (VIBRAMYCIN) 100 MG capsule Take 1 capsule (100 mg total) by mouth 2 (two) times daily. 02/20/15  Yes Costin Otelia SergeantM Gherghe, MD  econazole nitrate 1 % cream Apply 1 application topically daily as needed (affected area).  12/27/14  Yes Historical Provider, MD  gabapentin (NEURONTIN) 300 MG capsule Take 1 capsule (300 mg total) by mouth 3 (three) times daily. 04/05/16  Yes Tyrell AntonioFrederic Newton, MD  cyclobenzaprine (FLEXERIL) 10 MG tablet Take 1 tablet (10 mg total) by mouth 3 (three) times daily as needed for muscle spasms. Patient not taking: Reported on 04/05/2016 01/27/15   Tia Alertavid S Jones, MD  HYDROcodone-acetaminophen (NORCO/VICODIN) 5-325 MG tablet Take 2 tablets by mouth every 4 (four) hours as needed. Patient not taking: Reported on 04/05/2016 02/17/15   Elson AreasLeslie K Sofia, PA-C     Family History  Problem Relation Age of Onset  . Diabetes Mother   . Hypertension Mother   . Anemia Mother     Died of aplastic anemia  . Lung disease Father   . Heart attack Brother   . Heart disease Sister     One sister had heart arrest    Social History   Social History  . Marital status: Divorced    Spouse name: N/A  . Number of children: N/A  . Years of education: N/A   Social History Main Topics  . Smoking  status: Former Smoker    Quit date: 01/13/2006  . Smokeless tobacco: Not on file  . Alcohol use Yes     Comment: occasional   . Drug use: No  . Sexual activity: Not on file   Other Topics Concern  . Not on file   Social History Narrative  . No narrative on file    Review of Systems: A 12 point ROS discussed and pertinent positives are indicated in the HPI above.  All other systems are negative.  Review of Systems  Vital Signs: BP (!) 146/81 (BP Location: Left Arm, Patient Position: Sitting, Cuff Size: Normal)   Pulse 95   Temp 98 F (36.7  C) (Oral)   Resp 14   Ht 6\' 1"  (1.854 m)   Wt 235 lb (106.6 kg)   SpO2 98%   BMI 31.00 kg/m   Physical Exam  Constitutional: He is oriented to person, place, and time. He appears well-developed and well-nourished. No distress.  HENT:  Head: Normocephalic and atraumatic.  Eyes: No scleral icterus.  Cardiovascular: Normal rate and regular rhythm.   Pulmonary/Chest: Effort normal.  Abdominal: Soft. He exhibits no distension. There is no tenderness.  Musculoskeletal:       Legs: Palpable soft, compressible, rubbery bluish mass along the medial aspect of the mid-calf.  No warmth, thrill or pulsation.  By [place], multiple small vascular channels can be identified.  Drainage via the GSV.   Neurological: He is alert and oriented to person, place, and time.  Skin: Skin is warm and dry.  Psychiatric: He has a normal mood and affect. His behavior is normal.  Nursing note and vitals reviewed.    Imaging: No results found.  Labs:  CBC: No results for input(s): WBC, HGB, HCT, PLT in the last 8760 hours.  COAGS: No results for input(s): INR, APTT in the last 8760 hours.  BMP:  Recent Labs  02/09/16 1533  CREATININE 0.90    LIVER FUNCTION TESTS: No results for input(s): BILITOT, AST, ALT, ALKPHOS, PROT, ALBUMIN in the last 8760 hours.  TUMOR MARKERS: No results for input(s): AFPTM, CEA, CA199, CHROMGRNA in the last 8760 hours.  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 I reviewed his imaging including prior MRA/MRV and also perform bedside ultrasound in the office today.  This appears to be a moderately complex venous malformation with systemic drainage into the great saphenous vein. There are numerous vascular channels which would be amenable to percutaneous sclerotherapy. I discussed the risks including pain, tissue ulceration and loss requiring skin grafting, requirement for multiple treatment  sessions, and recurrent disease following therapy as well as the benefits and alternatives to percutaneous sclerotherapy.  He understands that percutaneous sclerotherapy with dehydrated ethanol is the most effective treatment for this type of lesion and he desires to proceed.  1.) I will have my office staff schedule him for percutaneous sclerotherapy to be performed at [facility] under general anesthesia.   2.) I will also ask my office staff to communicate with the OR to see if we can borrow a lower extremity pneumatic blood pressure cuff similar to what they use as a pneumatic tourniquet when performing total knee arthroplasty. We will use this to achieve stasis and prevent venous outflow of the injected alcohol during the procedure.   Thank you for this interesting consult.  I greatly enjoyed meeting Larry Houston and look forward to participating in their care.  A copy of this report was sent to the requesting provider on this date.  Electronically Signed: Malachy Moan 04/05/2016, 6:15 PM   I spent a total of  40 Minutes  in face to face in clinical consultation, greater than 50% of which was counseling/coordinating care for right lower extremity venous malformation.

## 2016-04-05 NOTE — Addendum Note (Signed)
Addended by: Ashok NorrisNEWTON, Demarques Pilz K on: 04/05/2016 12:35 PM   Modules accepted: Orders

## 2016-04-05 NOTE — Telephone Encounter (Signed)
I did refill with 1 more refill after this one, he needs to ask PCP to provide as I have not seen him since 2016 and he had back surgery by Dr. Yetta BarreJones in 2017

## 2016-04-17 ENCOUNTER — Other Ambulatory Visit (HOSPITAL_COMMUNITY): Payer: Self-pay | Admitting: Interventional Radiology

## 2016-04-17 DIAGNOSIS — Q279 Congenital malformation of peripheral vascular system, unspecified: Secondary | ICD-10-CM

## 2016-04-19 ENCOUNTER — Encounter (HOSPITAL_COMMUNITY): Payer: Self-pay

## 2016-04-19 ENCOUNTER — Encounter (HOSPITAL_COMMUNITY)
Admission: RE | Admit: 2016-04-19 | Discharge: 2016-04-19 | Disposition: A | Discharge status: Home / Self Care | Payer: 59 | Source: Ambulatory Visit | Attending: Interventional Radiology | Admitting: Interventional Radiology

## 2016-04-19 DIAGNOSIS — Z01818 Encounter for other preprocedural examination: Secondary | ICD-10-CM | POA: Insufficient documentation

## 2016-04-19 DIAGNOSIS — Q279 Congenital malformation of peripheral vascular system, unspecified: Secondary | ICD-10-CM | POA: Insufficient documentation

## 2016-04-19 LAB — BASIC METABOLIC PANEL
ANION GAP: 10 (ref 5–15)
BUN: 10 mg/dL (ref 6–20)
CHLORIDE: 103 mmol/L (ref 101–111)
CO2: 25 mmol/L (ref 22–32)
Calcium: 9.6 mg/dL (ref 8.9–10.3)
Creatinine, Ser: 1.07 mg/dL (ref 0.61–1.24)
GFR calc Af Amer: >60 mL/min (ref >60)
GFR calc non Af Amer: >60 mL/min (ref >60)
GLUCOSE: 104 mg/dL — AB (ref 65–99)
Potassium: 4.3 mmol/L (ref 3.5–5.1)
Sodium: 138 mmol/L (ref 135–145)

## 2016-04-19 LAB — CBC
HEMATOCRIT: 44.7 % (ref 39.0–52.0)
HEMOGLOBIN: 15.4 g/dL (ref 13.0–17.0)
MCH: 32.8 pg (ref 26.0–34.0)
MCHC: 34.5 g/dL (ref 30.0–36.0)
MCV: 95.1 fL (ref 78.0–100.0)
Platelets: 124 10*3/uL — ABNORMAL LOW (ref 150–400)
RBC: 4.7 MIL/uL (ref 4.22–5.81)
RDW: 13.8 % (ref 11.5–15.5)
WBC: 5.1 10*3/uL (ref 4.0–10.5)

## 2016-04-19 NOTE — Pre-Procedure Instructions (Signed)
;     Larry Houston  04/19/2016      Your procedure is scheduled on Monday, April 16.  Report to [facility] Admitting at 7:00 AM               Your surgery or procedure is scheduled for 9:00 AM   Call this number if you have problems the morning of surgery: [phone]    Remember:  Do not eat food or drink liquids after midnight Sunday, April 15.  Take these medicines the morning of surgery with A SIP OF WATER :amLODipine (NORVASC), gabapentin (NEURONTIN).                  May take Tylenol if needed.            DO not take any Aspirin,. Aspirin Products (BC Powders, Goody Powders), Aleve (NAproxen), Advil (Ibuprofen).    Do not wear jewelry, make-up or nail polish.  Do not wear lotions, powders, or perfumes, or deodorant.  Do not shave 48 hours prior to surgery.  Men may shave face and neck.  Do not bring valuables to the hospital.  [facility] is not responsible for any belongings or valuables.  Contacts, dentures or bridgework may not be worn into surgery.  Leave your suitcase in the car.  After surgery it may be brought to your room.  For patients admitted to the hospital, discharge time will be determined by your treatment team.  Patients discharged the day of surgery will not be allowed to drive home.   Name and phone number of your driver:   -  Special instructions: Review  [place] - Preparing For Surgery.  Please read over the following fact sheets that you were given. [place]- Preparing For Surgery , Pain Booklet

## 2016-04-23 ENCOUNTER — Ambulatory Visit (HOSPITAL_COMMUNITY): Admission: RE | Admit: 2016-04-23 | Payer: 59 | Source: Ambulatory Visit

## 2016-04-23 ENCOUNTER — Ambulatory Visit (HOSPITAL_COMMUNITY): Admission: RE | Admit: 2016-04-23 | Payer: 59 | Source: Ambulatory Visit | Admitting: Interventional Radiology

## 2016-04-23 SURGERY — RADIOLOGY WITH ANESTHESIA
Anesthesia: General

## 2016-05-22 ENCOUNTER — Ambulatory Visit: Payer: 59 | Admitting: Podiatry

## 2016-05-22 MED FILL — ETODOLAC 400 MG TABLET: 400 | 30 days supply | Qty: 60 | Fill #0

## 2016-06-22 ENCOUNTER — Encounter: Payer: Self-pay | Admitting: Interventional Radiology

## 2016-07-05 MED FILL — DESONIDE 0.05% OINTMENT: 0.05 | 30 days supply | Qty: 60 | Fill #0

## 2016-07-05 MED FILL — ETODOLAC 400 MG TABLET: 400 | 30 days supply | Qty: 60 | Fill #0

## 2016-07-05 MED FILL — AMLODIPINE BESYLATE 5 MG TA: 5 | 90 days supply | Qty: 90 | Fill #0

## 2016-07-16 ENCOUNTER — Telehealth (INDEPENDENT_AMBULATORY_CARE_PROVIDER_SITE_OTHER): Payer: Self-pay | Admitting: Physical Medicine and Rehabilitation

## 2016-07-16 NOTE — Telephone Encounter (Signed)
Per last refill request, patient will need to get refills from PCP. I called and left a message advising patient to call his PCP for refills.

## 2016-07-16 NOTE — Telephone Encounter (Signed)
Patient called asking for a refill on gabapentin. CB # [phone]

## 2016-08-03 DIAGNOSIS — I10 Essential (primary) hypertension: Secondary | ICD-10-CM | POA: Diagnosis not present

## 2016-08-03 DIAGNOSIS — B354 Tinea corporis: Secondary | ICD-10-CM | POA: Diagnosis not present

## 2016-08-03 DIAGNOSIS — N529 Male erectile dysfunction, unspecified: Secondary | ICD-10-CM | POA: Diagnosis not present

## 2016-08-03 DIAGNOSIS — I868 Varicose veins of other specified sites: Secondary | ICD-10-CM | POA: Diagnosis not present

## 2016-08-03 DIAGNOSIS — M79606 Pain in leg, unspecified: Secondary | ICD-10-CM | POA: Diagnosis not present

## 2016-08-03 MED FILL — KETOCONAZOLE 2% CREAM: 2 | 15 days supply | Qty: 30 | Fill #0

## 2016-08-03 MED FILL — GABAPENTIN 300 MG CAPS: 300 | 30 days supply | Qty: 30 | Fill #0

## 2016-08-15 IMAGING — CT CT PELVIS W/ CM
2 of 3 series · 11 of 46 positions shown, 12 images · IV contrast (Iodine)
Comparison: None.

CLINICAL DATA: Swelling, painful scrotum with 2 areas of
foul-smelling discharge in the scrotum and perineum.

EXAM:
CT PELVIS WITH CONTRAST
TECHNIQUE: Multidetector CT imaging of the pelvis was performed using the
standard protocol following the bolus administration of intravenous
contrast.
CONTRAST:  100mL OMNIPAQUE IOHEXOL 300 MG/ML  SOLN

[Series 201: routine, idose (2) · axial · 0.83mm/px · z∈[-88,+252]mm · 8 of 78 slices shown, 9 images]
[im 5/78  soft-tissue]
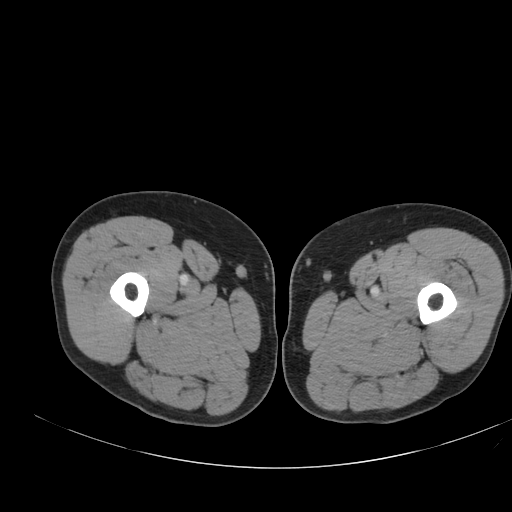
[im 5/78  bone]
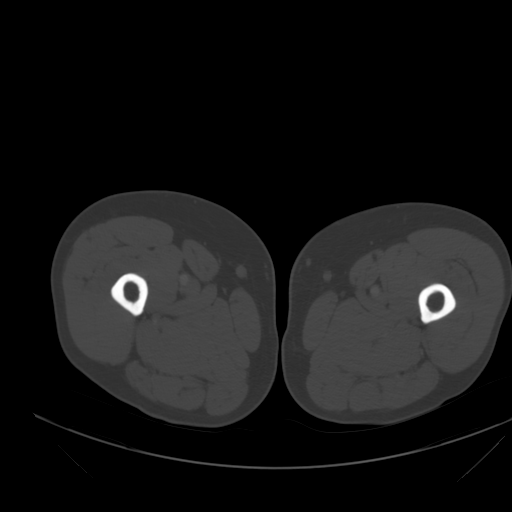
[im 15/78  soft-tissue]
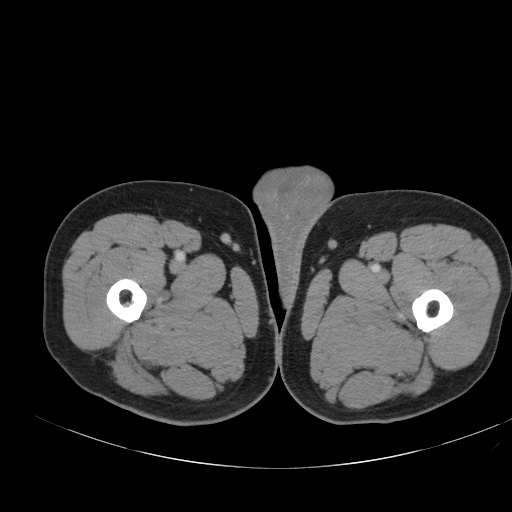
[im 25/78  soft-tissue]
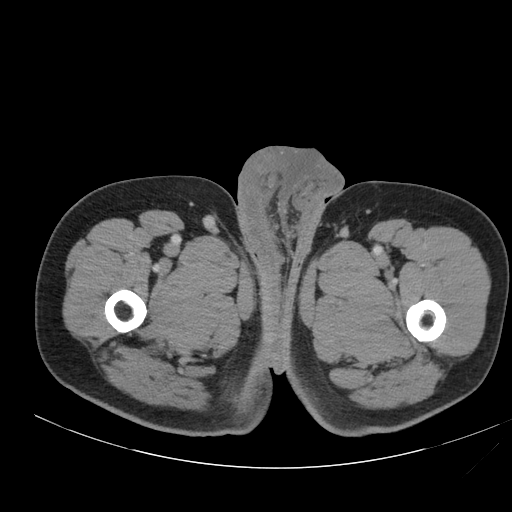
[im 35/78  soft-tissue]
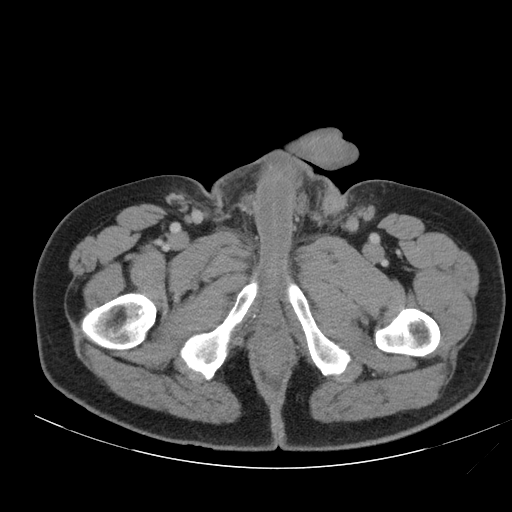
[im 43/78  soft-tissue]
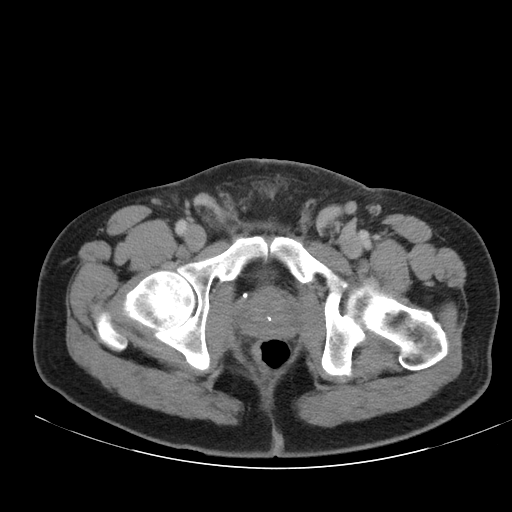
[im 53/78  soft-tissue]
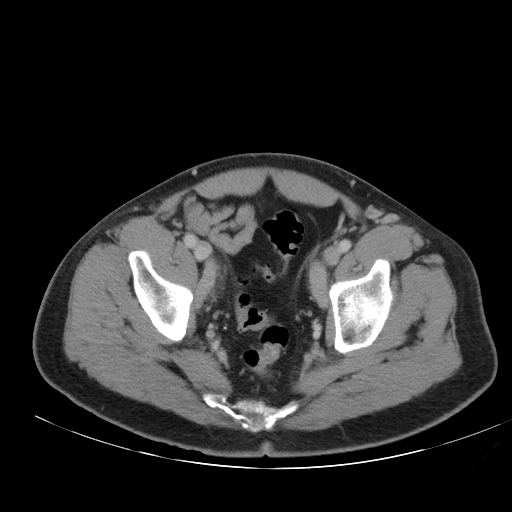
[im 63/78  soft-tissue]
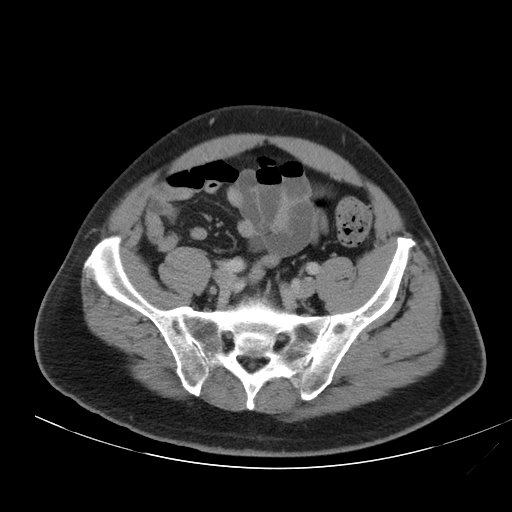
[im 73/78  soft-tissue]
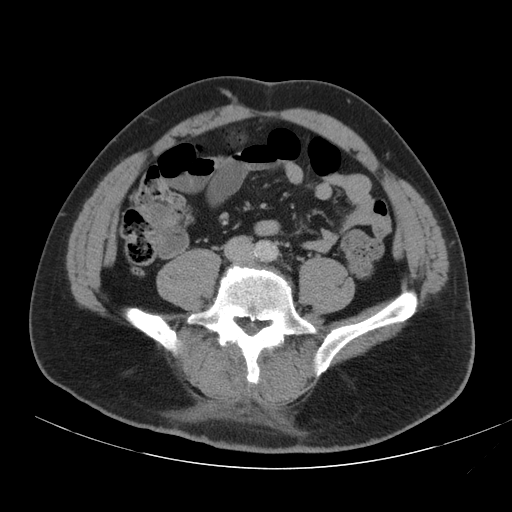

[Series 203: coronals, idose (2) · coronal · 0.45mm/px · 3 of 120 slices shown]
[im 40/120  soft-tissue]
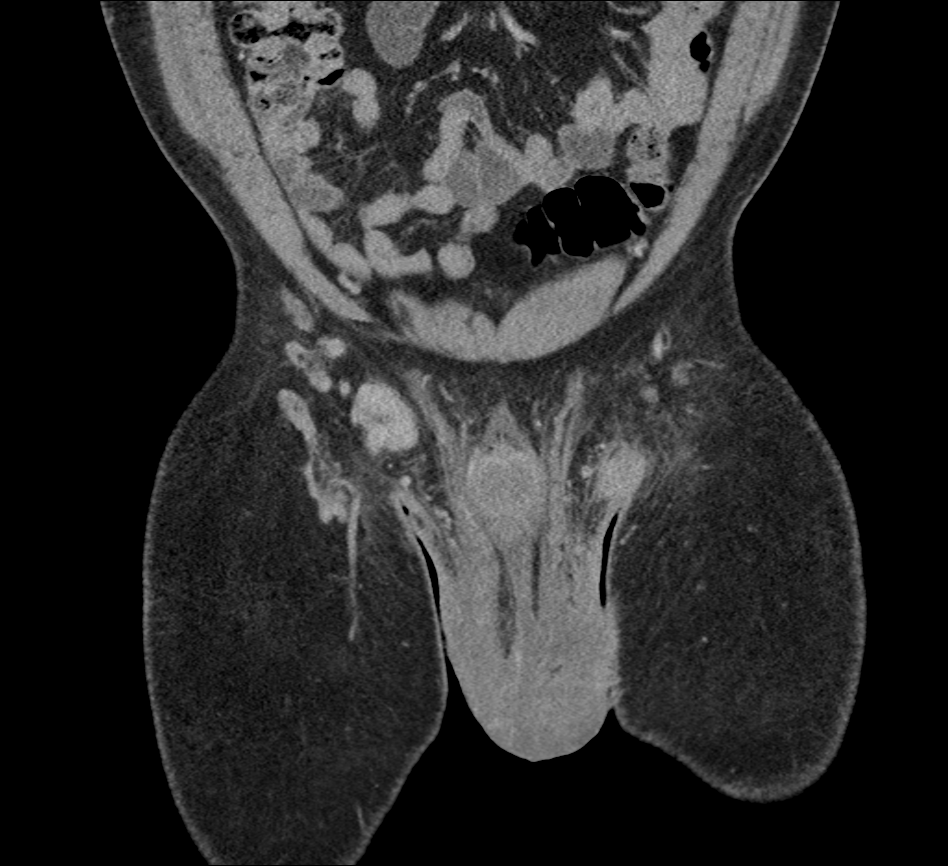
[im 53/120  soft-tissue]
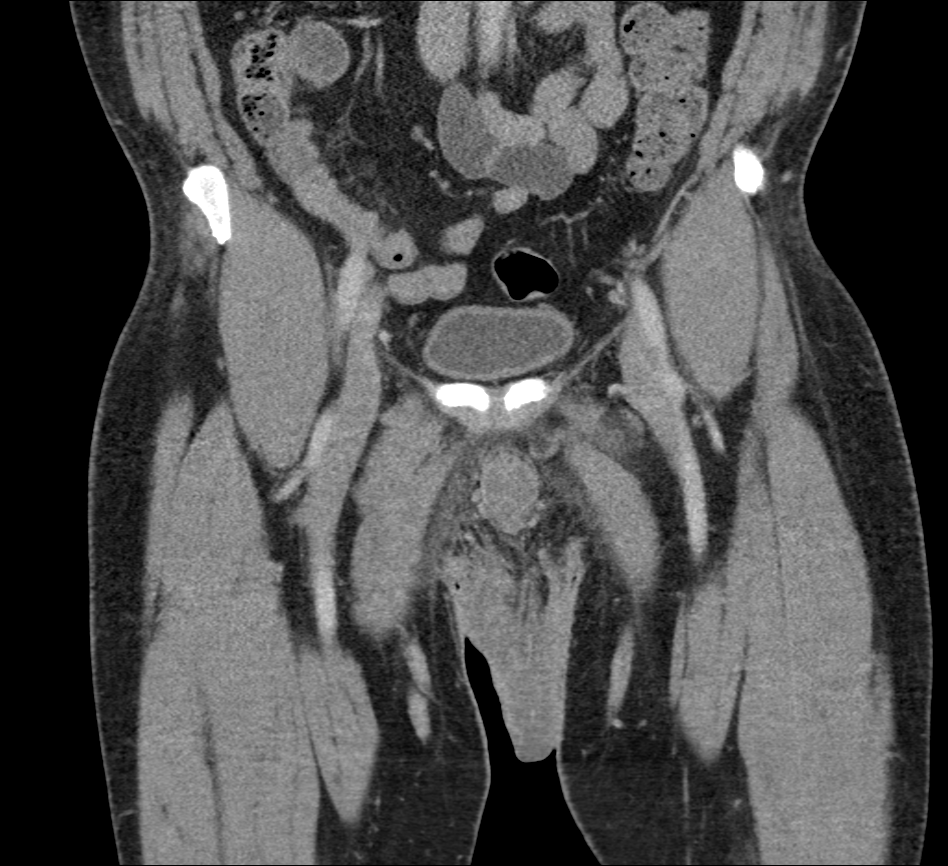
[im 67/120  soft-tissue]
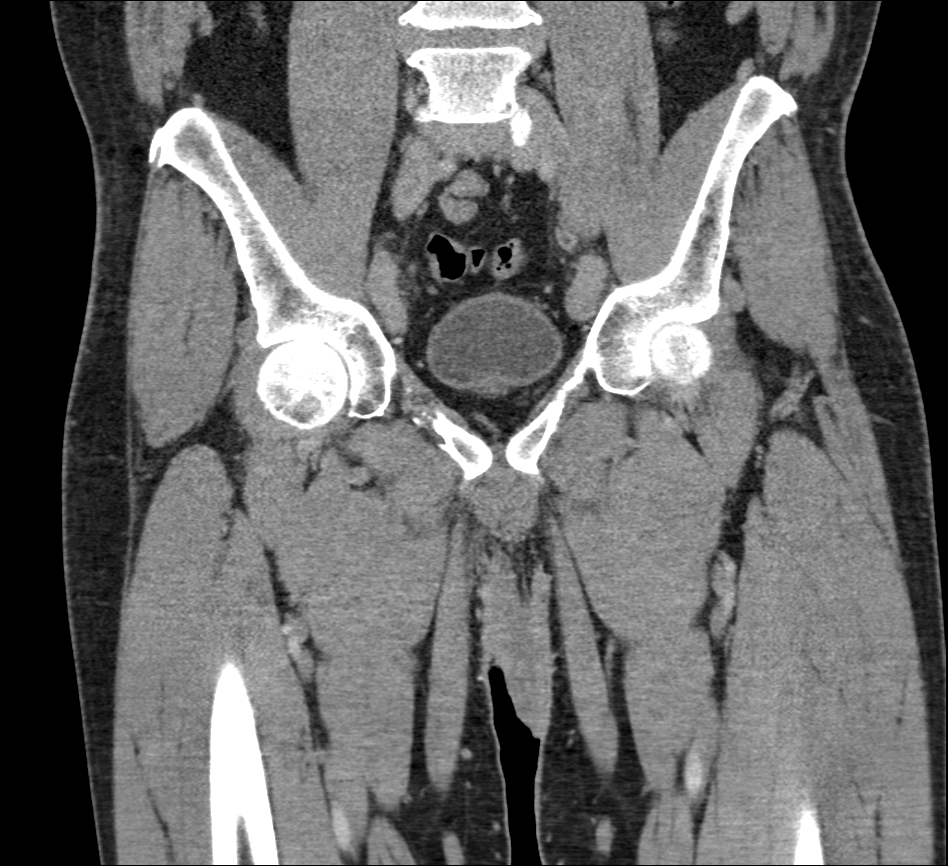

[11 of 46 positions shown; findings below may reference images not displayed]

FINDINGS: Large amount soft tissue edema/ fluid about the scrotum and
throughout the perineum. Tiny circumscribed fluid density collection
at the posterior margin of the lower right scrotum (series 201,
image 62) measures only 5 mm greatest dimension, possibly a tiny
abscess by without air to confirm.

Additional soft tissue thickening/phlegmon is seen at the
inferomedial aspect of the left inguinal region (series 201, image
44), measuring approximately 2.4 cm greatest dimension, with
punctate focus of associated air. No fluid density collection is
appreciated in this area.

No soft tissue gas within the scrotum or perineum to suggest
Fournier gangrene.

Numerous enlarged lymph nodes are seen within the inguinal regions
bilaterally, likely reactive in nature.

Visualized portion of the bowel, within the pelvis and lower
abdomen, is normal in caliber and configuration. No bowel wall
thickening or evidence of bowel wall inflammation. No free fluid or
abscess within the intraperitoneal abdomen or pelvis.

Prominent lymph nodes are seen within the iliac chain regions
bilaterally, also likely reactive in nature. Incidental note made of
a 2 mm stone within the bladder. Prostate gland is at least mildly
enlarged causing slight mass effect on the bladder base. No acute
osseous abnormality.
IMPRESSION: 1. Large amount of soft tissue edema/fluid about the scrotum and
throughout the perineum. Tiny circumscribed fluid density collection
at the posterior margin of the lower right scrotum, measuring only 5
mm greatest dimension, perhaps a tiny abscess but no associated air
or tract to confirm.
2. Additional soft tissue thickening/phlegmon at the inferomedial
aspect of the left inguinal region, superficial in location,
measuring approximately 2.4 cm greatest dimension, with single
punctate focus of air. No fluid density collection appreciated in
this area. Additional clinical data states that there was a recent
abscess incision, possibly at this location given the punctate focus
of air.
3. Overall, no drainable fluid collection identified.
4. No soft tissue gas about the scrotum or within the perineum to
confirm a Fournier gangrene.
5. Numerous enlarged lymph nodes within the inguinal regions
bilaterally, and within the iliac chain regions bilaterally, likely
reactive in nature.
6. Additional chronic/incidental findings detailed above.

## 2016-09-03 ENCOUNTER — Encounter (HOSPITAL_COMMUNITY): Payer: Self-pay | Admitting: Radiology

## 2016-09-03 DIAGNOSIS — Q279 Congenital malformation of peripheral vascular system, unspecified: Secondary | ICD-10-CM

## 2016-09-03 HISTORY — DX: Congenital malformation of peripheral vascular system, unspecified: Q27.9

## 2016-09-03 MED FILL — GABAPENTIN 300 MG CAPSULE: 300 | 30 days supply | Qty: 90 | Fill #1

## 2016-09-03 MED FILL — ETODOLAC 400 MG TABLET: 400 | 30 days supply | Qty: 60 | Fill #1

## 2016-09-04 MED FILL — DESONIDE 0.05% OINTMENT: 0.05 | 15 days supply | Qty: 60 | Fill #1

## 2016-09-05 MED FILL — SILDENAFIL CITRATE 100 MG T: 100 | 60 days supply | Qty: 12 | Fill #0

## 2016-09-11 NOTE — Pre-Procedure Instructions (Signed)
Tora KindredRonnie G Buckingham  09/11/2016      [facility] [place] [address] [place] [address] Phone: [phone] Fax: [phone]    Your procedure is scheduled on September 6  [facility] Ad[facility] at 0600 A.M.  Call this number if you have problems the morning of surgery:  [phone]   Remember:  Do not eat food or drink liquids after midnight.  Continue all other medications as directed by your physician except follow these instructions about you medications   Take these medicines the morning of surgery with A SIP OF WATER  acetaminophen (TYLENOL)  amLODipine (NORVASC)  doxycycline (VIBRAMYCIN)   7 days prior to surgery STOP taking any Aspirin, Aleve, Naproxen, Ibuprofen, Motrin, Advil, Goody's, BC's, all herbal medications, fish oil, and all vitamins    Do not wear jewelry  Do not wear lotions, powders, or cologne, or deoderant.  Men may shave face and neck.  Do not bring valuables to the hospital.  [facility] is not responsible for any belongings or valuables.  Contacts, dentures or bridgework may not be worn into surgery.  Leave your suitcase in the car.  After surgery it may be brought to your room.  For patients admitted to the hospital, discharge time will be determined by your treatment team.  Patients discharged the day of surgery will not be allowed to drive home.    Special instructions:   [place]- Preparing For Surgery  Before surgery, you can play an important role. Because skin is not sterile, your skin needs to be as free of germs as possible. You can reduce the number of germs on your skin by washing with CHG (chlorahexidine gluconate) Soap before surgery.  CHG is an antiseptic cleaner which kills germs and bonds with the skin to continue killing germs even after washing.  Please do not use if you have an allergy to CHG or antibacterial soaps. If your skin becomes  reddened/irritated stop using the CHG.  Do not shave (including legs and underarms) for at least 48 hours prior to first CHG shower. It is OK to shave your face.  Please follow these instructions carefully.   1. Shower the NIGHT BEFORE SURGERY and the MORNING OF SURGERY with CHG.   2. If you chose to wash your hair, wash your hair first as usual with your normal shampoo.  3. After you shampoo, rinse your hair and body thoroughly to remove the shampoo.  4. Use CHG as you would any other liquid soap. You can apply CHG directly to the skin and wash gently with a scrungie or a clean washcloth.   5. Apply the CHG Soap to your body ONLY FROM THE NECK DOWN.  Do not use on open wounds or open sores. Avoid contact with your eyes, ears, mouth and genitals (private parts). Wash genitals (private parts) with your normal soap.  6. Wash thoroughly, paying special attention to the area where your surgery will be performed.  7. Thoroughly rinse your body with warm water from the neck down.  8. DO NOT shower/wash with your normal soap after using and rinsing off the CHG Soap.  9. Pat yourself dry with a CLEAN TOWEL.   10. Wear CLEAN PAJAMAS   11. Place CLEAN SHEETS on your bed the night of your first shower and DO NOT SLEEP WITH PETS.    Day of Surgery: Do not apply any deodorants/lotions. Please  wear clean clothes to the hospital/surgery center.      Please read over the following fact sheets that you were given.

## 2016-09-12 ENCOUNTER — Other Ambulatory Visit: Payer: Self-pay | Admitting: Student

## 2016-09-12 ENCOUNTER — Encounter (HOSPITAL_COMMUNITY): Payer: Self-pay

## 2016-09-12 ENCOUNTER — Encounter (HOSPITAL_COMMUNITY)
Admission: RE | Admit: 2016-09-12 | Discharge: 2016-09-12 | Disposition: A | Discharge status: Home / Self Care | Payer: 59 | Source: Ambulatory Visit | Attending: Interventional Radiology | Admitting: Interventional Radiology

## 2016-09-12 ENCOUNTER — Other Ambulatory Visit: Payer: Self-pay | Admitting: Radiology

## 2016-09-12 DIAGNOSIS — Z880 Allergy status to penicillin: Secondary | ICD-10-CM | POA: Diagnosis not present

## 2016-09-12 DIAGNOSIS — M1990 Unspecified osteoarthritis, unspecified site: Secondary | ICD-10-CM | POA: Diagnosis not present

## 2016-09-12 DIAGNOSIS — Q279 Congenital malformation of peripheral vascular system, unspecified: Secondary | ICD-10-CM | POA: Diagnosis not present

## 2016-09-12 DIAGNOSIS — I10 Essential (primary) hypertension: Secondary | ICD-10-CM | POA: Diagnosis not present

## 2016-09-12 DIAGNOSIS — Z87891 Personal history of nicotine dependence: Secondary | ICD-10-CM | POA: Diagnosis not present

## 2016-09-12 LAB — BASIC METABOLIC PANEL
ANION GAP: 7 (ref 5–15)
BUN: 12 mg/dL (ref 6–20)
CHLORIDE: 102 mmol/L (ref 101–111)
CO2: 30 mmol/L (ref 22–32)
CREATININE: 0.98 mg/dL (ref 0.61–1.24)
Calcium: 9.1 mg/dL (ref 8.9–10.3)
GFR calc non Af Amer: >60 mL/min (ref >60)
GLUCOSE: 177 mg/dL — AB (ref 65–99)
Potassium: 3.8 mmol/L (ref 3.5–5.1)
Sodium: 139 mmol/L (ref 135–145)

## 2016-09-12 LAB — CBC
HCT: 44 % (ref 39.0–52.0)
HEMOGLOBIN: 15.6 g/dL (ref 13.0–17.0)
MCH: 33.8 pg (ref 26.0–34.0)
MCHC: 35.5 g/dL (ref 30.0–36.0)
MCV: 95.2 fL (ref 78.0–100.0)
Platelets: 114 10*3/uL — ABNORMAL LOW (ref 150–400)
RBC: 4.62 MIL/uL (ref 4.22–5.81)
RDW: 13.6 % (ref 11.5–15.5)
WBC: 4.5 10*3/uL (ref 4.0–10.5)

## 2016-09-12 LAB — APTT: aPTT: 27 seconds (ref 24–36)

## 2016-09-12 LAB — PROTIME-INR
INR: 0.99
Prothrombin Time: 13 seconds (ref 11.4–15.2)

## 2016-09-12 LAB — GLUCOSE, CAPILLARY: Glucose-Capillary: 186 mg/dL — ABNORMAL HIGH (ref 65–99)

## 2016-09-12 NOTE — Progress Notes (Addendum)
PCP: Clovis RileyMitchell, L.August Saucerean, MD  Cardiologist: Dr. Coral ElseVance Brabham  EKG:  04/19/16  Stress test: pt denies ever  ECHO: pt denies ever  Cardiac Cath: pt denies ever  Chest x-ray: pt denies past year

## 2016-09-12 NOTE — Anesthesia Preprocedure Evaluation (Addendum)
Anesthesia Evaluation  Patient identified by MRN, date of birth, ID band Patient awake    Reviewed: Allergy & Precautions, H&P , NPO status , Patient's Chart, lab work & pertinent test results  Airway Mallampati: II  TM Distance: >3 FB Neck ROM: Full    Dental no notable dental hx. (+) Dental Advisory Given, Edentulous Upper, Edentulous Lower   Pulmonary neg pulmonary ROS, former smoker,    Pulmonary exam normal breath sounds clear to auscultation       Cardiovascular Exercise Tolerance: Good hypertension, Pt. on medications  Rhythm:Regular Rate:Normal     Neuro/Psych negative neurological ROS  negative psych ROS   GI/Hepatic negative GI ROS, Neg liver ROS,   Endo/Other  negative endocrine ROS  Renal/GU negative Renal ROS  negative genitourinary   Musculoskeletal  (+) Arthritis , Osteoarthritis,    Abdominal   Peds  Hematology negative hematology ROS (+)   Anesthesia Other Findings   Reproductive/Obstetrics negative OB ROS                           Anesthesia Physical Anesthesia Plan  ASA: II  Anesthesia Plan: General   Post-op Pain Management:    Induction: Intravenous  PONV Risk Score and Plan: 3 and Ondansetron, Dexamethasone and Midazolam  Airway Management Planned: Oral ETT and LMA  Additional Equipment:   Intra-op Plan:   Post-operative Plan: Extubation in OR  Informed Consent: I have reviewed the patients History and Physical, chart, labs and discussed the procedure including the risks, benefits and alternatives for the proposed anesthesia with the patient or authorized representative who has indicated his/her understanding and acceptance.   Dental advisory given  Plan Discussed with: CRNA  Anesthesia Plan Comments:         Anesthesia Quick Evaluation

## 2016-09-13 ENCOUNTER — Ambulatory Visit (HOSPITAL_COMMUNITY): Payer: 59 | Admitting: Anesthesiology

## 2016-09-13 ENCOUNTER — Ambulatory Visit (HOSPITAL_COMMUNITY): Payer: 59

## 2016-09-13 ENCOUNTER — Encounter (HOSPITAL_COMMUNITY): Payer: Self-pay | Admitting: Certified Registered"

## 2016-09-13 ENCOUNTER — Encounter (HOSPITAL_COMMUNITY)
Admission: RE | Disposition: A | Discharge status: Home / Self Care | Payer: Self-pay | Source: Ambulatory Visit | Attending: Interventional Radiology

## 2016-09-13 ENCOUNTER — Observation Stay (HOSPITAL_COMMUNITY)
Admission: RE | Admit: 2016-09-13 | Discharge: 2016-09-14 | Disposition: A | Discharge status: Home / Self Care | Payer: 59 | Source: Ambulatory Visit | Attending: Interventional Radiology | Admitting: Interventional Radiology

## 2016-09-13 ENCOUNTER — Ambulatory Visit (HOSPITAL_COMMUNITY)
Admission: RE | Admit: 2016-09-13 | Discharge: 2016-09-13 | Disposition: A | Discharge status: Home / Self Care | Payer: 59 | Source: Ambulatory Visit | Attending: Interventional Radiology | Admitting: Interventional Radiology

## 2016-09-13 DIAGNOSIS — I10 Essential (primary) hypertension: Secondary | ICD-10-CM | POA: Diagnosis not present

## 2016-09-13 DIAGNOSIS — R040 Epistaxis: Secondary | ICD-10-CM | POA: Diagnosis not present

## 2016-09-13 DIAGNOSIS — Z880 Allergy status to penicillin: Secondary | ICD-10-CM | POA: Insufficient documentation

## 2016-09-13 DIAGNOSIS — M1990 Unspecified osteoarthritis, unspecified site: Secondary | ICD-10-CM | POA: Diagnosis not present

## 2016-09-13 DIAGNOSIS — Z87891 Personal history of nicotine dependence: Secondary | ICD-10-CM | POA: Diagnosis not present

## 2016-09-13 DIAGNOSIS — Q279 Congenital malformation of peripheral vascular system, unspecified: Secondary | ICD-10-CM | POA: Diagnosis not present

## 2016-09-13 DIAGNOSIS — Q2730 Arteriovenous malformation, site unspecified: Secondary | ICD-10-CM | POA: Diagnosis not present

## 2016-09-13 DIAGNOSIS — D696 Thrombocytopenia, unspecified: Secondary | ICD-10-CM | POA: Diagnosis not present

## 2016-09-13 HISTORY — PX: IR US GUIDE VASC ACCESS RIGHT: IMG2390

## 2016-09-13 HISTORY — PX: RADIOLOGY WITH ANESTHESIA: SHX6223

## 2016-09-13 HISTORY — PX: IR SCLEROTHERAPY OF A FLUID COLLECTION: IMG6090

## 2016-09-13 SURGERY — RADIOLOGY WITH ANESTHESIA
Anesthesia: General

## 2016-09-13 MED ORDER — FENTANYL 40 MCG/ML IV SOLN
INTRAVENOUS | Status: DC
  Filled 2016-09-13: qty 25

## 2016-09-13 MED ORDER — ALCOHOL 98 % IJ SOLN
20.0000 mL | Freq: Once | INTRAMUSCULAR | Status: AC
  Administered 2016-09-13: 2.2 mL via INTRAVENOUS
  Filled 2016-09-13: qty 20

## 2016-09-13 MED ORDER — KETOROLAC TROMETHAMINE 30 MG/ML IJ SOLN
INTRAMUSCULAR | Status: AC
  Administered 2016-09-13: 60 mg via INTRAMUSCULAR
  Filled 2016-09-13: qty 2

## 2016-09-13 MED ORDER — HYDROCODONE-ACETAMINOPHEN 5-325 MG PO TABS
1.0000 | ORAL_TABLET | ORAL | Status: DC | PRN
  Administered 2016-09-13: 2 via ORAL

## 2016-09-13 MED ORDER — MIDAZOLAM HCL 2 MG/2ML IJ SOLN
INTRAMUSCULAR | Status: DC | PRN
Start: 2016-09-13 — End: 2016-09-13
  Administered 2016-09-13: 2 mg via INTRAVENOUS

## 2016-09-13 MED ORDER — NALOXONE HCL 0.4 MG/ML IJ SOLN: 0.4000 mg | INTRAMUSCULAR | Status: DC | PRN

## 2016-09-13 MED ORDER — KETOROLAC TROMETHAMINE 30 MG/ML IJ SOLN
30.0000 mg | Freq: Four times a day (QID) | INTRAMUSCULAR | Status: DC
  Administered 2016-09-13 – 2016-09-14 (×3): 30 mg via INTRAVENOUS
  Filled 2016-09-13 (×3): qty 1

## 2016-09-13 MED ORDER — HEPARIN SODIUM (PORCINE) 1000 UNIT/ML IJ SOLN
INTRAMUSCULAR | Status: DC | PRN
  Administered 2016-09-13: 3000 [IU] via INTRAVENOUS

## 2016-09-13 MED ORDER — HYDROMORPHONE HCL 1 MG/ML IJ SOLN
INTRAMUSCULAR | Status: AC
  Administered 2016-09-13: 0.5 mg via INTRAVENOUS
  Filled 2016-09-13: qty 1

## 2016-09-13 MED ORDER — IODIXANOL 320 MG/ML IV SOLN
INTRAVENOUS | Status: DC | PRN
  Administered 2016-09-13: 5 mL via INTRAVENOUS

## 2016-09-13 MED ORDER — FENTANYL CITRATE (PF) 100 MCG/2ML IJ SOLN
INTRAMUSCULAR | Status: DC | PRN
  Administered 2016-09-13: 50 ug via INTRAVENOUS
  Administered 2016-09-13 (×2): 100 ug via INTRAVENOUS

## 2016-09-13 MED ORDER — ALCOHOL, USP 95 % SOLN
20.0000 mL | Status: DC
  Filled 2016-09-13: qty 20

## 2016-09-13 MED ORDER — HYDROCODONE-ACETAMINOPHEN 5-325 MG PO TABS
ORAL_TABLET | ORAL | Status: AC
  Filled 2016-09-13: qty 2

## 2016-09-13 MED ORDER — OXYCODONE-ACETAMINOPHEN 5-325 MG PO TABS: 1.0000 | ORAL_TABLET | ORAL | Status: DC | PRN

## 2016-09-13 MED ORDER — ROCURONIUM BROMIDE 10 MG/ML (PF) SYRINGE
PREFILLED_SYRINGE | INTRAVENOUS | Status: DC | PRN
  Administered 2016-09-13: 10 mg via INTRAVENOUS
  Administered 2016-09-13: 50 mg via INTRAVENOUS
  Administered 2016-09-13: 30 mg via INTRAVENOUS

## 2016-09-13 MED ORDER — PHENYLEPHRINE 40 MCG/ML (10ML) SYRINGE FOR IV PUSH (FOR BLOOD PRESSURE SUPPORT)
PREFILLED_SYRINGE | INTRAVENOUS | Status: DC | PRN
  Administered 2016-09-13: 200 ug via INTRAVENOUS
  Administered 2016-09-13: 80 ug via INTRAVENOUS
  Administered 2016-09-13: 120 ug via INTRAVENOUS

## 2016-09-13 MED ORDER — ONDANSETRON HCL 4 MG/2ML IJ SOLN
4.0000 mg | Freq: Four times a day (QID) | INTRAMUSCULAR | Status: DC | PRN
  Administered 2016-09-13: 4 mg via INTRAVENOUS
  Filled 2016-09-13: qty 2

## 2016-09-13 MED ORDER — ONDANSETRON HCL 4 MG/2ML IJ SOLN
INTRAMUSCULAR | Status: DC | PRN
  Administered 2016-09-13: 4 mg via INTRAVENOUS

## 2016-09-13 MED ORDER — DIPHENHYDRAMINE HCL 12.5 MG/5ML PO ELIX: 12.5000 mg | ORAL_SOLUTION | Freq: Four times a day (QID) | ORAL | Status: DC | PRN

## 2016-09-13 MED ORDER — LIDOCAINE 2% (20 MG/ML) 5 ML SYRINGE
INTRAMUSCULAR | Status: DC | PRN
  Administered 2016-09-13: 60 mg via INTRAVENOUS

## 2016-09-13 MED ORDER — SODIUM CHLORIDE 0.9 % IV SOLN: INTRAVENOUS | Status: DC

## 2016-09-13 MED ORDER — KETOROLAC TROMETHAMINE 30 MG/ML IJ SOLN
60.0000 mg | Freq: Once | INTRAMUSCULAR | Status: AC
  Administered 2016-09-13: 60 mg via INTRAMUSCULAR

## 2016-09-13 MED ORDER — DIPHENHYDRAMINE HCL 50 MG/ML IJ SOLN: 12.5000 mg | Freq: Four times a day (QID) | INTRAMUSCULAR | Status: DC | PRN

## 2016-09-13 MED ORDER — DEXTROSE 5 % IV SOLN
INTRAVENOUS | Status: DC | PRN
  Administered 2016-09-13: 10 ug/min via INTRAVENOUS

## 2016-09-13 MED ORDER — ALCOHOL 98 % IJ SOLN: 20.0000 mL | Freq: Once | INTRAMUSCULAR | Status: DC

## 2016-09-13 MED ORDER — ALCOHOL 98 % IJ SOLN
20.0000 mL | Freq: Once | INTRAMUSCULAR | Status: DC
  Filled 2016-09-13: qty 20

## 2016-09-13 MED ORDER — DEXAMETHASONE SODIUM PHOSPHATE 10 MG/ML IJ SOLN
INTRAMUSCULAR | Status: DC | PRN
  Administered 2016-09-13: 10 mg via INTRAVENOUS

## 2016-09-13 MED ORDER — PROPOFOL 10 MG/ML IV BOLUS
INTRAVENOUS | Status: DC | PRN
  Administered 2016-09-13 (×2): 50 mg via INTRAVENOUS
  Administered 2016-09-13: 200 mg via INTRAVENOUS
  Administered 2016-09-13: 50 mg via INTRAVENOUS

## 2016-09-13 MED ORDER — EPHEDRINE SULFATE-NACL 50-0.9 MG/10ML-% IV SOSY
PREFILLED_SYRINGE | INTRAVENOUS | Status: DC | PRN
  Administered 2016-09-13: 10 mg via INTRAVENOUS

## 2016-09-13 MED ORDER — LACTATED RINGERS IV SOLN
INTRAVENOUS | Status: DC
  Administered 2016-09-13: 08:00:00 via INTRAVENOUS

## 2016-09-13 MED ORDER — SODIUM CHLORIDE 0.9% FLUSH: 9.0000 mL | INTRAVENOUS | Status: DC | PRN

## 2016-09-13 MED ORDER — HYDROMORPHONE HCL 1 MG/ML IJ SOLN
0.2500 mg | INTRAMUSCULAR | Status: DC | PRN
  Administered 2016-09-13 (×2): 0.5 mg via INTRAVENOUS

## 2016-09-13 MED ORDER — SUGAMMADEX SODIUM 200 MG/2ML IV SOLN
INTRAVENOUS | Status: DC | PRN
  Administered 2016-09-13: 230 mg via INTRAVENOUS

## 2016-09-13 NOTE — Anesthesia Procedure Notes (Signed)
Procedure Name: Intubation Date/Time: 09/13/2016 8:58 AM Performed by: Barrington Ellison Pre-anesthesia Checklist: Patient identified, Emergency Drugs available, Suction available and Patient being monitored Patient Re-evaluated:Patient Re-evaluated prior to induction Oxygen Delivery Method: Circle System Utilized Preoxygenation: Pre-oxygenation with 100% oxygen Induction Type: IV induction Ventilation: Mask ventilation without difficulty Laryngoscope Size: Mac and 4 Grade View: Grade I Tube type: Oral Tube size: 7.5 mm Number of attempts: 1 Airway Equipment and Method: Stylet and Oral airway Placement Confirmation: ETT inserted through vocal cords under direct vision,  positive ETCO2 and breath sounds checked- equal and bilateral Secured at: 22 cm Tube secured with: Tape Dental Injury: Teeth and Oropharynx as per pre-operative assessment

## 2016-09-13 NOTE — Procedures (Signed)
Interventional Radiology Procedure Note  Procedure: Direct puncture alcohol ablation of RLE calf low flow venous malformation.   Complications: None  Estimated Blood Loss: None  Recommendations: - Admit for observation and pain control - Regular diet - Fentanyl PCA if needed, oral norco if not - Toradol  Signed,  Sterling BigHeath K. McCullough, MD

## 2016-09-13 NOTE — Progress Notes (Addendum)
Patient seen at beside with Dr. Archer AsaMcCullough.  States he is doing well s/p alcohol ablation of RLE calf venous malformation and has no complaints. Feels his leg pain and tightness has improved since procedure.  Continue current management.  Observe overnight.  Will plan to discharge in the AM if remains stable and pain well-controlled.  Will need follow-up in IR clinic in 2-4 weeks.   Loyce DysKacie Matthews, MS RD PA-C 5:04 PM

## 2016-09-13 NOTE — H&P (Deleted)
  The note originally documented on this encounter has been moved the the encounter in which it belongs.  

## 2016-09-13 NOTE — Anesthesia Postprocedure Evaluation (Signed)
Anesthesia Post Note  Patient: Larry Houston  Procedure(s) Performed: Procedure(s) (LRB): percutaneous sclerotherapy with dehydrated ethanol (N/A)     Patient location during evaluation: PACU Anesthesia Type: General Level of consciousness: awake and alert Pain management: pain level controlled Vital Signs Assessment: post-procedure vital signs reviewed and stable Respiratory status: spontaneous breathing, nonlabored ventilation, respiratory function stable and patient connected to nasal cannula oxygen Cardiovascular status: blood pressure returned to baseline and stable Postop Assessment: no signs of nausea or vomiting Anesthetic complications: no    Last Vitals:  Vitals:   09/13/16 1211 09/13/16 1245  BP: (!) 145/97   Pulse: 80   Resp: 17   Temp:  36.6 C  SpO2: 95%     Last Pain:  Vitals:   09/13/16 1245  TempSrc:   PainSc: 0-No pain                 Terease Marcotte,W. EDMOND

## 2016-09-13 NOTE — Transfer of Care (Signed)
Immediate Anesthesia Transfer of Care Note  Patient: Larry Houston  Procedure(s) Performed: Procedure(s): percutaneous sclerotherapy with dehydrated ethanol (N/A)  Patient Location: PACU  Anesthesia Type:General  Level of Consciousness: drowsy and patient cooperative  Airway & Oxygen Therapy: Patient Spontanous Breathing and Patient connected to nasal cannula oxygen  Post-op Assessment: Report given to RN  Post vital signs: Reviewed and stable  Last Vitals:  Vitals:   09/13/16 0630  BP: (!) 177/109  Pulse: 83  Resp: 20  Temp: 36.7 C  SpO2: 100%    Last Pain:  Vitals:   09/13/16 0723  TempSrc:   PainSc: 4       Patients Stated Pain Goal: 1 (09/13/16 0723)  Complications: No apparent anesthesia complications

## 2016-09-13 NOTE — H&P (Signed)
Chief Complaint: Right calf venous malformation  Referring Physician(s): Nada Libman  Supervising Physician: Malachy Moan  Patient Status: Larry Houston - Out-pt  History of Present Illness: Larry Houston is a 61 y.o. 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    He initially thought these were simple varicose veins and began wearing compression hose, however this offered no benefit.   The region is very focal and has continued to enlarge and is now becoming symptomatic, particularly after a day of standing on his feet.   He works at [employer] and is on his feet for the majority of the day.  He saw Dr. Archer Asa in the clinic on 04/05/2016 to discuss percutaneous sclerotherapy.    He denies issues with prior DVT, chest pain, shortness of breath or other systemic symptoms.   He has not experienced any spontaneous bleeding from this lesion.   He is NPO. He does not take blood thinners.  Past Medical History:  Diagnosis Date  . Alcohol abuse   . Arthritis    lumbar stenosis   . Hypertension   . Venous malformation 09/03/2016   Right lower extremity venous malformation    Past Surgical History:  Procedure Laterality Date  . IR RADIOLOGIST EVAL & MGMT  04/05/2016  . LUMBAR LAMINECTOMY/DECOMPRESSION MICRODISCECTOMY Bilateral 01/26/2015   Procedure: Laminectomy and Foraminotomy - L4-L5 - bilateral;  Surgeon: Tia Alert, MD;  Location: [facility];  Service: Neurosurgery;  Laterality: Bilateral;  Laminectomy and Foraminotomy - L4-L5 - bilateral  . NASAL ENDOSCOPY WITH EPISTAXIS CONTROL N/A 04/04/2014   Procedure: NASAL ENDOSCOPY WITH EPISTAXIS CONTROL;  Surgeon: Melvenia Beam, MD;  Location: [facility] OR;  Service: ENT;  Laterality: N/A;  . NECK SURGERY  1992   posterior fusion- cervical     Allergies: Penicillins  Medications: Prior to Admission medications   Medication Sig Start Date  End Date Taking? Authorizing Provider  acetaminophen (TYLENOL) 500 MG tablet Take 500 mg by mouth every 6 (six) hours as needed for mild pain.   Yes [provider]  amLODipine (NORVASC) 5 MG tablet Take 5 mg by mouth daily before breakfast.    Yes [provider]  doxycycline (VIBRAMYCIN) 100 MG capsule Take 1 capsule (100 mg total) by mouth 2 (two) times daily. 02/20/15  Yes Leatha Gilding, MD  econazole nitrate 1 % cream Apply 1 application topically daily as needed (affected area).  12/27/14  Yes [provider]  gabapentin (NEURONTIN) 300 MG capsule Take 1 capsule (300 mg total) by mouth 3 (three) times daily. Patient taking differently: Take 300 mg by mouth at bedtime.  04/05/16  Yes Tyrell Antonio, MD  ibuprofen (ADVIL,MOTRIN) 200 MG tablet Take 400 mg by mouth every 6 (six) hours as needed.   Yes [provider]  sildenafil (VIAGRA) 100 MG tablet Take 1-2 tablets by mouth daily as needed. 09/05/16  Yes [provider]     Family History  Problem Relation Age of Onset  . Diabetes Mother   . Hypertension Mother   . Anemia Mother        Died of aplastic anemia  . Lung disease Father   . Heart attack Brother   . Heart disease Sister        One sister had heart arrest    Social History   Social History  . Marital status: Divorced    Spouse name: N/A  .  Number of children: N/A  . Years of education: N/A   Social History Main Topics  . Smoking status: Former Smoker    Years: 7.00    Quit date: 01/13/2006  . Smokeless tobacco: Never Used  . Alcohol use Yes     Comment: occasional   . Drug use: No  . Sexual activity: Not on file   Other Topics Concern  . Not on file   Social History Narrative  . No narrative on file     Review of Systems: A 12 point ROS discussed  Review of Systems  Constitutional: Negative.   HENT: Negative.   Respiratory: Negative.   Cardiovascular: Negative.   Gastrointestinal: Negative.     Genitourinary: Negative.   Musculoskeletal:       Right calf venous malformation  Neurological: Negative.   Hematological: Negative.   Psychiatric/Behavioral: Negative.     Vital Signs: There were no vitals taken for this visit.  Physical Exam  Constitutional: He is oriented to person, place, and time. He appears well-developed.  HENT:  Head: Normocephalic and atraumatic.  Eyes: EOM are normal.  Neck: Normal range of motion.  Cardiovascular: Normal rate, regular rhythm and normal heart sounds.   No murmur heard. Pulmonary/Chest: Effort normal and breath sounds normal. No respiratory distress. He has no wheezes.  Abdominal: Soft. He exhibits no distension. There is no tenderness.  Musculoskeletal:       Legs: Palpable soft, compressible, rubbery bluish mass along the medial aspect of the mid-calf.  No warmth, thrill or pulsation.  By [place], multiple small vascular channels can be identified.  Drainage via the GSV.    Neurological: He is alert and oriented to person, place, and time.  Skin: Skin is warm and dry.  Psychiatric: He has a normal mood and affect. His behavior is normal. Judgment and thought content normal.    Mallampati Score: Performed by anesthesia team  Imaging: No results found.  Labs:  CBC:  Recent Labs  04/19/16 1059 09/12/16 1211  WBC 5.1 4.5  HGB 15.4 15.6  HCT 44.7 44.0  PLT 124* 114*    COAGS:  Recent Labs  09/12/16 1211  INR 0.99  APTT 27    BMP:  Recent Labs  02/09/16 1533 04/19/16 1059 09/12/16 1211  NA  --  138 139  K  --  4.3 3.8  CL  --  103 102  CO2  --  25 30  GLUCOSE  --  104* 177*  BUN  --  10 12  CALCIUM  --  9.6 9.1  CREATININE 0.90 1.07 0.98  GFRNONAA  --  >60 >60  GFRAA  --  >60 >60    LIVER FUNCTION TESTS: No results for input(s): BILITOT, AST, ALT, ALKPHOS, PROT, ALBUMIN in the last 8760 hours.  TUMOR MARKERS: No results for input(s): AFPTM, CEA, CA199, CHROMGRNA in the last 8760 hours.  Assessment  and Plan: Enlarging right lower extremity superficial venous malformation.  Dr. Archer Asa has reviewed his imaging including prior MRA/MRV and also perform bedside ultrasound in the office on 04/05/2016  This appears to be a moderately complex venous malformation with systemic drainage into the great saphenous vein. There are numerous vascular channels which would be amenable to percutaneous sclerotherapy.  Dr. Archer Asa discussed the risks including pain, tissue ulceration and loss requiring skin grafting, requirement for multiple treatment sessions, and recurrent disease following therapy as well as the benefits and alternatives to percutaneous sclerotherapy.  He understands that percutaneous sclerotherapy with  dehydrated ethanol is the most effective treatment for this type of lesion and he desires to proceed today.  Thank you for this interesting consult.  I greatly enjoyed meeting Larry Houston and look forward to participating in their care.  A copy of this report was sent to the requesting provider on this date.  Electronically Signed: Gwynneth MacleodWENDY S Estuardo Frisbee, PA-C 09/13/2016, 7:35 AM   I spent a total of   25 Minutes in face to face in clinical consultation, greater than 50% of which was counseling/coordinating care for sclerotherapy of right calf venous malformation.

## 2016-09-14 ENCOUNTER — Encounter (HOSPITAL_COMMUNITY): Payer: Self-pay | Admitting: Interventional Radiology

## 2016-09-14 DIAGNOSIS — I10 Essential (primary) hypertension: Secondary | ICD-10-CM | POA: Diagnosis not present

## 2016-09-14 DIAGNOSIS — Q279 Congenital malformation of peripheral vascular system, unspecified: Secondary | ICD-10-CM | POA: Diagnosis not present

## 2016-09-14 DIAGNOSIS — M1990 Unspecified osteoarthritis, unspecified site: Secondary | ICD-10-CM | POA: Diagnosis not present

## 2016-09-14 DIAGNOSIS — Z87891 Personal history of nicotine dependence: Secondary | ICD-10-CM | POA: Diagnosis not present

## 2016-09-14 DIAGNOSIS — Q2730 Arteriovenous malformation, site unspecified: Secondary | ICD-10-CM | POA: Diagnosis not present

## 2016-09-14 DIAGNOSIS — Z880 Allergy status to penicillin: Secondary | ICD-10-CM | POA: Diagnosis not present

## 2016-09-14 NOTE — Discharge Summary (Signed)
Patient ID: Larry Houston MRN: [id number] DOB/AGE: [birth year] 60 y.o.  Admit date: 09/13/2016 Discharge date: 09/14/2016  Supervising Physician: Malachy MoanMcCullough, Heath  [facility]  Admission Diagnoses: RLE venous malformation  Discharge Diagnoses:  Active Problems:   Venous malformation   Discharged Condition: improved  Hospital Course: Right lower extremity --enlarging and painful venous malformation. Was seen in consult with Dr Archer AsaMcCullough for sclerotherapy. Procedure was performed in IR 09/13/2016 Tolerated well; no complications. Admitted overnight for observation and pain control. Eating well; drinking well Slept well Denies N/V (did have some nausea with Dilaudid IV one dose- last night-- resolved) Urinating well Ambulating well; minimal pain to touch site. Obvious less swelling of malformation already today. Ready for discharge. Pt has been seen and examined by Dr Archer AsaMcCullough  Consults: None  Significant Diagnostic Studies: IR SCLEROTHERAPY OF A FLUID COLLECTION  Treatments: Interventional Radiology Procedure Note Procedure: Direct puncture alcohol ablation of RLE calf low flow venous malformation.   Discharge Exam: Blood pressure (!) 153/80, pulse 78, temperature 98.3 F (36.8 C), temperature source Oral, resp. rate 18, height 6\' 1"  (1.854 m), weight 229 lb 14.4 oz (104.3 kg), SpO2 94 %.  A/O Appropriate Pleasant Heart RRR Lungs CTA Abd soft +BS Ext: FROM; ambulating without help RLE with evidence of less swelling of venous malformation 1.5x 1.5 cm darkened center of malformation secondary sclerotherapy Minimal tenderness to touch Skin intact; no blisters No redness; no sign of infection UOP good  Results for orders placed or performed during the hospital encounter of 09/12/16  APTT  Result Value Ref Range   aPTT 27 24 - 36 seconds  Basic metabolic panel  Result Value Ref Range   Sodium 139 135 - 145 mmol/L   Potassium 3.8 3.5 - 5.1  mmol/L   Chloride 102 101 - 111 mmol/L   CO2 30 22 - 32 mmol/L   Glucose, Bld 177 (H) 65 - 99 mg/dL   BUN 12 6 - 20 mg/dL   Creatinine, Ser 4.090.98 0.61 - 1.24 mg/dL   Calcium 9.1 8.9 - 81.110.3 mg/dL   GFR calc non Af Amer >60 >60 mL/min   GFR calc Af Amer >60 >60 mL/min   Anion gap 7 5 - 15  CBC  Result Value Ref Range   WBC 4.5 4.0 - 10.5 K/uL   RBC 4.62 4.22 - 5.81 MIL/uL   Hemoglobin 15.6 13.0 - 17.0 g/dL   HCT 91.444.0 78.239.0 - 95.652.0 %   MCV 95.2 78.0 - 100.0 fL   MCH 33.8 26.0 - 34.0 pg   MCHC 35.5 30.0 - 36.0 g/dL   RDW 21.313.6 08.611.5 - 57.815.5 %   Platelets 114 (L) 150 - 400 K/uL  Protime-INR  Result Value Ref Range   Prothrombin Time 13.0 11.4 - 15.2 seconds   INR 0.99   Glucose, capillary  Result Value Ref Range   Glucose-Capillary 186 (H) 65 - 99 mg/dL   Comment 1 Notify RN    Comment 2 Document in Chart     Disposition:  Right low leg venous malformation Sclerotherapy procedure performed in IR 09/13/16-- Dr Meryle ReadyMcCullouggh Tolerated well Up in chair; ambulating Minimal pain at site Resume all meds Vicoprofen 7.5/200 mg #20 Rx given to pt per Dr Archer AsaMcCullough 2-4 week follow up appt in clinic Pt has good understanding if discharge instructions  Discharge Instructions    Call MD for:  difficulty breathing, headache or visual disturbances    Complete by:  As directed  Call MD for:  hives    Complete by:  As directed    Call MD for:  persistant dizziness or light-headedness    Complete by:  As directed    Call MD for:  persistant nausea and vomiting    Complete by:  As directed    Call MD for:  redness, tenderness, or signs of infection (pain, swelling, redness, odor or green/yellow discharge around incision site)    Complete by:  As directed    Call MD for:  severe uncontrolled pain    Complete by:  As directed    Call MD for:  temperature >100.4    Complete by:  As directed    Diet - low sodium heart healthy    Complete by:  As directed    Discharge instructions    Complete  by:  As directed    Resume all meds; Vicoprofen 7.5/200 mg #20 Rx given; 2-4 week follow up with Dr Archer Asa   Driving Restrictions    Complete by:  As directed    No driving x 3 days   Increase activity slowly    Complete by:  As directed    No wound care    Complete by:  As directed      Allergies as of 09/14/2016      Reactions   Penicillins Hives   Has patient had a PCN reaction causing immediate rash, facial/tongue/throat swelling, SOB or lightheadedness with hypotension: No Has patient had a PCN reaction causing severe rash involving mucus membranes or skin necrosis: No Has patient had a PCN reaction that required hospitalization No Has patient had a PCN reaction occurring within the last 10 years: No If all of the above answers are "NO", then may proceed with Cephalosporin use.      Medication List    TAKE these medications   acetaminophen 500 MG tablet Commonly known as:  TYLENOL Take 500 mg by mouth every 6 (six) hours as needed for mild pain.   amLODipine 5 MG tablet Commonly known as:  NORVASC Take 5 mg by mouth daily before breakfast.   doxycycline 100 MG capsule Commonly known as:  VIBRAMYCIN Take 1 capsule (100 mg total) by mouth 2 (two) times daily.   econazole nitrate 1 % cream Apply 1 application topically daily as needed (affected area).   gabapentin 300 MG capsule Commonly known as:  NEURONTIN Take 1 capsule (300 mg total) by mouth 3 (three) times daily. What changed:  when to take this   ibuprofen 200 MG tablet Commonly known as:  ADVIL,MOTRIN Take 400 mg by mouth every 6 (six) hours as needed.   sildenafil 100 MG tablet Commonly known as:  VIAGRA Take 1-2 tablets by mouth daily as needed.            Discharge Care Instructions        Start     Ordered   09/14/16 0000  IR Radiologist Eval & Mgmt    Question Answer Comment  Reason for Exam (SYMPTOM  OR DIAGNOSIS REQUIRED) Rt lower extremity AVM treatment 09/13/16----2-4 weeks follow up  in clinic with Dr Archer Asa   Preferred Imaging Location? [facility]      09/14/16 0821   09/14/16 0000  Increase activity slowly     09/14/16 0821   09/14/16 0000  Diet - low sodium heart healthy     09/14/16 1610   09/14/16 0000  Discharge instructions    Comments:  Resume all meds;  Vicoprofen 7.5/200 mg #20 Rx given; 2-4 week follow up with Dr Archer Asa   09/14/16 1610   09/14/16 0000  Driving Restrictions    Comments:  No driving x 3 days   96/04/[birth year] 0821   09/14/16 0000  No wound care     09/14/16 0821   09/14/16 0000  Call MD for:  temperature >100.4     09/14/16 0821   09/14/16 0000  Call MD for:  persistant nausea and vomiting     09/14/16 0821   09/14/16 0000  Call MD for:  severe uncontrolled pain     09/14/16 0821   09/14/16 0000  Call MD for:  difficulty breathing, headache or visual disturbances     09/14/16 0821   09/14/16 0000  Call MD for:  redness, tenderness, or signs of infection (pain, swelling, redness, odor or green/yellow discharge around incision site)     09/14/16 0821   09/14/16 0000  Call MD for:  hives     09/14/16 0821   09/14/16 0000  Call MD for:  persistant dizziness or light-headedness     09/14/16 0821     Follow-up Information    Malachy Moan, MD Follow up in 3 week(s).   Specialties:  Interventional Radiology, Radiology Why:  2-4 weeks follow up with Dr Archer Asa; clinic will call pt with time and date Contact information: [address] [place] [address] [phone]            Electronically Signed: Ralene Muskrat A, PA-C 09/14/2016, 8:28 AM   I have spent Greater Than 30 Minutes discharging Larry Houston.

## 2016-09-18 MED FILL — HYDROCOD-IBU 7.5-200 TAB: 7.5-200 | 5 days supply | Qty: 20 | Fill #0

## 2016-10-05 DIAGNOSIS — R7301 Impaired fasting glucose: Secondary | ICD-10-CM | POA: Diagnosis not present

## 2016-10-11 MED FILL — AMLODIPINE BESYLATE 5 MG TA: 5 | 90 days supply | Qty: 90 | Fill #0

## 2016-10-11 MED FILL — ETODOLAC 400 MG TABLET: 400 | 90 days supply | Qty: 180 | Fill #0

## 2016-10-17 ENCOUNTER — Other Ambulatory Visit (HOSPITAL_COMMUNITY): Payer: Self-pay | Admitting: Interventional Radiology

## 2016-10-17 ENCOUNTER — Ambulatory Visit
Admission: RE | Admit: 2016-10-17 | Discharge: 2016-10-17 | Disposition: A | Discharge status: Home / Self Care | Payer: 59 | Source: Ambulatory Visit | Attending: Radiology | Admitting: Radiology

## 2016-10-17 DIAGNOSIS — Q279 Congenital malformation of peripheral vascular system, unspecified: Secondary | ICD-10-CM | POA: Diagnosis not present

## 2016-10-17 HISTORY — PX: IR RADIOLOGIST EVAL & MGMT: IMG5224

## 2016-10-17 NOTE — Progress Notes (Signed)
Chief Complaint: Patient was seen in consultation today for  Chief Complaint  Patient presents with  . Follow-up    1 mo follow up Sclerotherapy     History of Present Illness: Larry Houston is a 61 y.o. male  with a several year history of enlarging visible venous varicosities in the medial aspect of the right mid calf.  He initially thought these were simple varicose veins and began wearing compression hose, however this offered no benefit.  The region is very focal and has continued to enlarge and is now becoming symptomatic, particularly after a day of standing on his feet.  He works at [employer] and is on his feet for the majority of the day. He initially saw Dr. Archer Asa in the clinic on 04/05/2016 to discuss percutaneous sclerotherapy. Dr. Archer Asa reviewed his imaging including prior MRA/MRV and also perform bedside ultrasound in the office on 04/05/2016 This appeared to be a moderately complex venous malformation with systemic drainage into the great saphenous vein. There are numerous vascular channels which would be amenable to percutaneous sclerotherapy. Dr. Archer Asa discussed the risks including pain, tissue ulceration and loss requiring skin grafting, requirement for multiple treatment sessions, and recurrent disease following therapy as well as the benefits and alternatives to percutaneous sclerotherapy. He underwent the procedure on 9/6 without immediate complications. Over the last couple weeks, he's had breakdown of the overlying skin and developed a wound. The surrounding veins prominence has subsided and he doesn't really have any pain. He's been putting Neosporin and a bandaid over it.  Past Medical History:  Diagnosis Date  . Alcohol abuse   . Arthritis    lumbar stenosis   . Hypertension   . Venous malformation 09/03/2016   Right lower extremity venous malformation    Past Surgical History:  Procedure Laterality Date  . IR  RADIOLOGIST EVAL & MGMT  04/05/2016  . IR SCLEROTHERAPY OF A FLUID COLLECTION Right 09/13/2016   Venous malformation [Q27.9]   RLE  . IR SCLEROTHERAPY OF A FLUID COLLECTION  09/13/2016  . IR US GUIDE VASC ACCESS RIGHT  09/13/2016  . LUMBAR LAMINECTOMY/DECOMPRESSION MICRODISCECTOMY Bilateral 01/26/2015   Procedure: Laminectomy and Foraminotomy - L4-L5 - bilateral;  Surgeon: Tia Alert, MD;  Location: [facility];  Service: Neurosurgery;  Laterality: Bilateral;  Laminectomy and Foraminotomy - L4-L5 - bilateral  . NASAL ENDOSCOPY WITH EPISTAXIS CONTROL N/A 04/04/2014   Procedure: NASAL ENDOSCOPY WITH EPISTAXIS CONTROL;  Surgeon: Melvenia Beam, MD;  Location[facility];  Service: ENT;  Laterality: N/A;  . NECK SURGERY  1992   posterior fusion- cervical   . RADIOLOGY WITH ANESTHESIA N/A 09/13/2016   Procedure: percutaneous sclerotherapy with dehydrated ethanol;  Surgeon: Malachy Moan, MD;  Location[facility];  Service: Radiology;  Laterality: N/A;    Allergies: Penicillins  Medications: Prior to Admission medications   Medication Sig Start Date End Date Taking? Authorizing Provider  acetaminophen (TYLENOL) 500 MG tablet Take 500 mg by mouth every 6 (six) hours as needed for mild pain.   Yes [provider]  amLODipine (NORVASC) 5 MG tablet Take 5 mg by mouth daily before breakfast.    Yes [provider]  econazole nitrate 1 % cream Apply 1 application topically daily as needed (affected area).  12/27/14  Yes [provider]  gabapentin (NEURONTIN) 300 MG capsule Take 1 capsule (300 mg total) by mouth 3 (three) times daily. Patient taking differently: Take 300 mg by mouth at bedtime.  04/05/16  Yes Tyrell Antonio, MD  ibuprofen (ADVIL,MOTRIN) 200 MG tablet Take 400 mg by mouth every 6 (six) hours as needed.   Yes [provider]  sildenafil (VIAGRA) 100 MG tablet Take 1-2 tablets by mouth daily as needed. 09/05/16  Yes [provider]  doxycycline  (VIBRAMYCIN) 100 MG capsule Take 1 capsule (100 mg total) by mouth 2 (two) times daily. Patient not taking: Reported on 10/17/2016 02/20/15   Leatha Gilding, MD     Family History  Problem Relation Age of Onset  . Diabetes Mother   . Hypertension Mother   . Anemia Mother        Died of aplastic anemia  . Lung disease Father   . Heart attack Brother   . Heart disease Sister        One sister had heart arrest    Social History   Social History  . Marital status: Divorced    Spouse name: N/A  . Number of children: N/A  . Years of education: N/A   Social History Main Topics  . Smoking status: Former Smoker    Years: 7.00    Quit date: 01/13/2006  . Smokeless tobacco: Never Used  . Alcohol use Yes     Comment: occasional   . Drug use: No  . Sexual activity: Not on file   Other Topics Concern  . Not on file   Social History Narrative  . No narrative on file    Review of Systems: A 12 point ROS discussed and pertinent positives are indicated in the HPI above.  All other systems are negative.  Review of Systems  Vital Signs: BP (!) 160/88   Pulse 80   Temp 98.2 F (36.8 C) (Oral)   Resp 15   Ht  (1.854 m)   Wt 240 lb (108.9 kg)   SpO2 97%   BMI 31.66 kg/m   Physical Exam (R) medial mid calf open wound approx 2 x 3 cm with soft eschar that has pulled away from the edges. No purulence or drainage. No surrounding erythema  Imaging: No results found.  Labs:  CBC:  Recent Labs  04/19/16 1059 09/12/16 1211  WBC 5.1 4.5  HGB 15.4 15.6  HCT 44.7 44.0  PLT 124* 114*    COAGS:  Recent Labs  09/12/16 1211  INR 0.99  APTT 27    BMP:  Recent Labs  02/09/16 1533 04/19/16 1059 09/12/16 1211  NA  --  138 139  K  --  4.3 3.8  CL  --  103 102  CO2  --  25 30  GLUCOSE  --  104* 177*  BUN  --  10 12  CALCIUM  --  9.6 9.1  CREATININE 0.90 1.07 0.98  GFRNONAA  --  >60 >60  GFRAA  --  >60 >60    LIVER FUNCTION TESTS: No results for  input(s): BILITOT, AST, ALT, ALKPHOS, PROT, ALBUMIN in the last 8760 hours.  TUMOR MARKERS: No results for input(s): AFPTM, CEA, CA199, CHROMGRNA in the last 8760 hours.  Assessment and Plan: S/p alcohol ablation of of right medial calf low flow venous malformation. Subsequent open wound formation. Recommend referral to wound care center for debridement and further recs. Advised for now, he can continue abx ointment to wound daily, but advised avoiding neomycin containing products.   Thank you for this interesting consult.  I greatly enjoyed meeting Larry Houston and look forward to participating in their  care.  A copy of this report was sent to the requesting provider on this date.  Electronically Signed: Brayton El 10/17/2016, 2:10 PM   I spent a total of 20 minutes in face to face in clinical consultation, greater than 50% of which was counseling/coordinating care for follow up venous ablation and wound eval

## 2016-10-30 MED FILL — DESONIDE 0.05% OINTMENT: 0.05 | 10 days supply | Qty: 60 | Fill #0

## 2016-11-07 ENCOUNTER — Encounter: Payer: Self-pay | Admitting: Interventional Radiology

## 2016-11-08 ENCOUNTER — Other Ambulatory Visit: Payer: 59

## 2016-11-09 ENCOUNTER — Encounter (HOSPITAL_BASED_OUTPATIENT_CLINIC_OR_DEPARTMENT_OTHER): Payer: 59 | Attending: Internal Medicine

## 2016-11-09 DIAGNOSIS — T8189XA Other complications of procedures, not elsewhere classified, initial encounter: Secondary | ICD-10-CM | POA: Insufficient documentation

## 2016-11-09 DIAGNOSIS — Y848 Other medical procedures as the cause of abnormal reaction of the patient, or of later complication, without mention of misadventure at the time of the procedure: Secondary | ICD-10-CM | POA: Insufficient documentation

## 2016-11-09 DIAGNOSIS — I10 Essential (primary) hypertension: Secondary | ICD-10-CM | POA: Insufficient documentation

## 2016-11-09 DIAGNOSIS — F17210 Nicotine dependence, cigarettes, uncomplicated: Secondary | ICD-10-CM | POA: Insufficient documentation

## 2016-11-09 DIAGNOSIS — L97812 Non-pressure chronic ulcer of other part of right lower leg with fat layer exposed: Secondary | ICD-10-CM | POA: Insufficient documentation

## 2016-11-15 ENCOUNTER — Other Ambulatory Visit: Payer: 59

## 2016-11-15 DIAGNOSIS — L97812 Non-pressure chronic ulcer of other part of right lower leg with fat layer exposed: Secondary | ICD-10-CM | POA: Diagnosis not present

## 2016-11-15 DIAGNOSIS — I872 Venous insufficiency (chronic) (peripheral): Secondary | ICD-10-CM | POA: Diagnosis not present

## 2016-11-15 DIAGNOSIS — I10 Essential (primary) hypertension: Secondary | ICD-10-CM | POA: Diagnosis not present

## 2016-11-15 DIAGNOSIS — L97212 Non-pressure chronic ulcer of right calf with fat layer exposed: Secondary | ICD-10-CM | POA: Diagnosis not present

## 2016-11-15 DIAGNOSIS — T8189XA Other complications of procedures, not elsewhere classified, initial encounter: Secondary | ICD-10-CM | POA: Diagnosis not present

## 2016-11-15 DIAGNOSIS — F17210 Nicotine dependence, cigarettes, uncomplicated: Secondary | ICD-10-CM | POA: Diagnosis not present

## 2016-11-15 DIAGNOSIS — Y848 Other medical procedures as the cause of abnormal reaction of the patient, or of later complication, without mention of misadventure at the time of the procedure: Secondary | ICD-10-CM | POA: Diagnosis not present

## 2016-11-23 DIAGNOSIS — I872 Venous insufficiency (chronic) (peripheral): Secondary | ICD-10-CM | POA: Diagnosis not present

## 2016-11-23 DIAGNOSIS — I10 Essential (primary) hypertension: Secondary | ICD-10-CM | POA: Diagnosis not present

## 2016-11-23 DIAGNOSIS — L97812 Non-pressure chronic ulcer of other part of right lower leg with fat layer exposed: Secondary | ICD-10-CM | POA: Diagnosis not present

## 2016-11-23 DIAGNOSIS — L97212 Non-pressure chronic ulcer of right calf with fat layer exposed: Secondary | ICD-10-CM | POA: Diagnosis not present

## 2016-11-23 DIAGNOSIS — F17210 Nicotine dependence, cigarettes, uncomplicated: Secondary | ICD-10-CM | POA: Diagnosis not present

## 2016-11-23 DIAGNOSIS — T8189XA Other complications of procedures, not elsewhere classified, initial encounter: Secondary | ICD-10-CM | POA: Diagnosis not present

## 2016-12-05 ENCOUNTER — Ambulatory Visit
Admission: RE | Admit: 2016-12-05 | Discharge: 2016-12-05 | Disposition: A | Discharge status: Home / Self Care | Payer: 59 | Source: Ambulatory Visit | Attending: Interventional Radiology | Admitting: Interventional Radiology

## 2016-12-05 DIAGNOSIS — I9789 Other postprocedural complications and disorders of the circulatory system, not elsewhere classified: Secondary | ICD-10-CM | POA: Diagnosis not present

## 2016-12-05 DIAGNOSIS — Z9889 Other specified postprocedural states: Secondary | ICD-10-CM | POA: Diagnosis not present

## 2016-12-05 DIAGNOSIS — Q279 Congenital malformation of peripheral vascular system, unspecified: Secondary | ICD-10-CM

## 2016-12-05 HISTORY — PX: IR RADIOLOGIST EVAL & MGMT: IMG5224

## 2016-12-05 MED FILL — GABAPENTIN 300 MG CAPSULE: 300 | 90 days supply | Qty: 90 | Fill #0

## 2016-12-05 NOTE — Progress Notes (Signed)
Chief Complaint: The patient is seen in follow up today s/p sclerotherapy  History of present illness: Larry Houston is a 61 y.o. 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 He initially saw Dr. Archer AsaMcCullough in the clinic on 3/29/2018to discuss percutaneous sclerotherapy and underwent the procedure on 9/6. He subsequently developed skin breakdown at the site overlying his treated venous malformation and was referred to the wound center for ongoing management. He reports that over the past month he has seen improvement in his wound with the use of medicated creams.  He reports occasional mild discomfort at the site of the ulcer, but overall has noticed improvement in the burning and heaviness in his legs. He has no complaints today.    Past Medical History:  Diagnosis Date  . Alcohol abuse   . Arthritis    lumbar stenosis   . Hypertension   . Venous malformation 09/03/2016   Right lower extremity venous malformation    Past Surgical History:  Procedure Laterality Date  . IR RADIOLOGIST EVAL & MGMT  04/05/2016  . IR RADIOLOGIST EVAL & MGMT  10/17/2016  . IR SCLEROTHERAPY OF A FLUID COLLECTION Right 09/13/2016   Venous malformation [Q27.9]   RLE  . IR SCLEROTHERAPY OF A FLUID COLLECTION  09/13/2016  . IR US GUIDE VASC ACCESS RIGHT  09/13/2016  . LUMBAR LAMINECTOMY/DECOMPRESSION MICRODISCECTOMY Bilateral 01/26/2015   Procedure: Laminectomy and Foraminotomy - L4-L5 - bilateral;  Surgeon: Tia Alertavid S Jones, MD;  Location: [facility];  Service: Neurosurgery;  Laterality: Bilateral;  Laminectomy and Foraminotomy - L4-L5 - bilateral  . NASAL ENDOSCOPY WITH EPISTAXIS CONTROL N/A 04/04/2014   Procedure: NASAL ENDOSCOPY WITH EPISTAXIS CONTROL;  Surgeon: Melvenia BeamMitchell Gore, MD;  [facility];  Service: ENT;  Laterality: N/A;  . NECK SURGERY  1992   posterior fusion- cervical   . RADIOLOGY WITH ANESTHESIA N/A 09/13/2016   Procedure: percutaneous sclerotherapy with dehydrated ethanol;  Surgeon: Malachy MoanMcCullough, Heath, MD;  [facility];  Service: Radiology;  Laterality: N/A;    Allergies: Penicillins  Medications: Prior to Admission medications   Medication Sig Start Date End Date Taking? Authorizing Provider  acetaminophen (TYLENOL) 500 MG tablet Take 500 mg by mouth every 6 (six) hours as needed for mild pain.   Yes [provider]  amLODipine (NORVASC) 5 MG tablet Take 5 mg by mouth daily before breakfast.    Yes [provider]  econazole nitrate 1 % cream Apply 1 application topically daily as needed (affected area).  12/27/14  Yes [provider]  gabapentin (NEURONTIN) 300 MG capsule Take 1 capsule (300 mg total) by mouth 3 (three) times daily. Patient taking differently: Take 300 mg by mouth at bedtime.  04/05/16  Yes Tyrell AntonioNewton, Frederic, MD  ibuprofen (ADVIL,MOTRIN) 200 MG tablet Take 400 mg by mouth every 6 (six) hours as needed.   Yes [provider]  sildenafil (VIAGRA) 100 MG tablet Take 1-2 tablets by mouth daily as needed. 09/05/16  Yes [provider]  doxycycline (VIBRAMYCIN) 100 MG capsule Take 1 capsule (100 mg total) by mouth 2 (two) times daily. Patient not taking: Reported on 10/17/2016 02/20/15   Leatha GildingGherghe, Costin M, MD     Family History  Problem Relation Age of Onset  . Diabetes Mother   . Hypertension Mother   . Anemia Mother        Died of aplastic anemia  . Lung disease  Father   . Heart attack Brother   . Heart disease Sister        One sister had heart arrest    Social History   Socioeconomic History  . Marital status: Divorced    Spouse name: Not on file  . Number of children: Not on file  . Years of education: Not on file  . Highest education level: Not on file  Social Needs  . Financial resource strain: Not on file  . Food insecurity - worry: Not on file  . Food insecurity - inability: Not on file  . Transportation needs -  medical: Not on file  . Transportation needs - non-medical: Not on file  Occupational History  . Not on file  Tobacco Use  . Smoking status: Former Smoker    Years: 7.00    Last attempt to quit: 01/13/2006    Years since quitting: 10.9  . Smokeless tobacco: Never Used  Substance and Sexual Activity  . Alcohol use: Yes    Comment: occasional   . Drug use: No  . Sexual activity: Not on file  Other Topics Concern  . Not on file  Social History Narrative  . Not on file     Vital Signs: BP (!) 156/90   Pulse 87   Temp 98.5 F (36.9 C) (Oral)   Resp 15   Ht 6\' 1"  (1.854 m)   Wt 238 lb (108 kg)   SpO2 97%   BMI 31.40 kg/m   Physical Exam  NAD, alert Skin:   Affected right mid calf has started to heal as only a small, central area remains open without active drainage. Surrounding skin has softened as well, area medial to the treatment site is soft, non-bulging.   Imaging: No results found.  Labs:  CBC: Recent Labs    04/19/16 1059 09/12/16 1211  WBC 5.1 4.5  HGB 15.4 15.6  HCT 44.7 44.0  PLT 124* 114*    COAGS: Recent Labs    09/12/16 1211  INR 0.99  APTT 27    BMP: Recent Labs    02/09/16 1533 04/19/16 1059 09/12/16 1211  NA  --  138 139  K  --  4.3 3.8  CL  --  103 102  CO2  --  25 30  GLUCOSE  --  104* 177*  BUN  --  10 12  CALCIUM  --  9.6 9.1  CREATININE 0.90 1.07 0.98  GFRNONAA  --  >60 >60  GFRAA  --  >60 >60    LIVER FUNCTION TESTS: No results for input(s): BILITOT, AST, ALT, ALKPHOS, PROT, ALBUMIN in the last 8760 hours.  Assessment: Venous malformation of the right mid calf s/p sclerotherapy 9/6, subsequent breakdown of overlying skin Patient has been going to the wound center for management of his skin ulcer and has progressed well.  Overall, he states his symptoms of burning, fatigue, and heaviness in his right leg have lessened.   He has been able to go about his usual activities without issue.  He is encouraged to continue  with wound care.  Will plan for follow-up in approximately 3 months or sooner if new problems arise.   Signed: Hoyt KochKacie Sue-Ellen Matthews, PA 12/05/2016, 2:27 PM   Please refer to Dr. Archer AsaMcCullough attestation of this note for management and plan.

## 2016-12-06 ENCOUNTER — Encounter: Payer: Self-pay | Admitting: *Deleted

## 2016-12-13 ENCOUNTER — Encounter (HOSPITAL_BASED_OUTPATIENT_CLINIC_OR_DEPARTMENT_OTHER): Payer: 59 | Attending: Internal Medicine

## 2016-12-13 DIAGNOSIS — L97211 Non-pressure chronic ulcer of right calf limited to breakdown of skin: Secondary | ICD-10-CM | POA: Insufficient documentation

## 2016-12-13 DIAGNOSIS — I872 Venous insufficiency (chronic) (peripheral): Secondary | ICD-10-CM | POA: Diagnosis not present

## 2016-12-13 DIAGNOSIS — I10 Essential (primary) hypertension: Secondary | ICD-10-CM | POA: Diagnosis not present

## 2016-12-13 DIAGNOSIS — F17210 Nicotine dependence, cigarettes, uncomplicated: Secondary | ICD-10-CM | POA: Insufficient documentation

## 2016-12-13 DIAGNOSIS — T8131XD Disruption of external operation (surgical) wound, not elsewhere classified, subsequent encounter: Secondary | ICD-10-CM | POA: Diagnosis not present

## 2016-12-13 DIAGNOSIS — Y839 Surgical procedure, unspecified as the cause of abnormal reaction of the patient, or of later complication, without mention of misadventure at the time of the procedure: Secondary | ICD-10-CM | POA: Insufficient documentation

## 2016-12-13 DIAGNOSIS — L97212 Non-pressure chronic ulcer of right calf with fat layer exposed: Secondary | ICD-10-CM | POA: Diagnosis not present

## 2016-12-27 MED FILL — DESONIDE 0.05% OINTMENT: 0.05 | 14 days supply | Qty: 60 | Fill #0

## 2017-01-07 MED FILL — AMLODIPINE BESYLATE 5 MG TA: 5 | 90 days supply | Qty: 90 | Fill #0

## 2017-01-10 ENCOUNTER — Encounter (HOSPITAL_BASED_OUTPATIENT_CLINIC_OR_DEPARTMENT_OTHER): Payer: 59 | Attending: Internal Medicine

## 2017-01-10 MED FILL — SILDENAFIL CITRATE 100 MG T: 100 | 30 days supply | Qty: 6 | Fill #0

## 2017-01-10 MED FILL — ETODOLAC 400 MG TABLET: 400 | 90 days supply | Qty: 180 | Fill #0

## 2017-01-22 MED FILL — GABAPENTIN 300 MG CAPSULE: 300 | 90 days supply | Qty: 90 | Fill #0

## 2017-02-11 MED FILL — DESONIDE 0.05% OINTMENT: 0.05 | 15 days supply | Qty: 60 | Fill #0

## 2017-03-15 MED FILL — DESONIDE 0.05% OINTMENT: 0.05 | 15 days supply | Qty: 60 | Fill #1

## 2017-04-01 DIAGNOSIS — B356 Tinea cruris: Secondary | ICD-10-CM | POA: Diagnosis not present

## 2017-04-01 DIAGNOSIS — E119 Type 2 diabetes mellitus without complications: Secondary | ICD-10-CM | POA: Diagnosis not present

## 2017-04-01 DIAGNOSIS — Z125 Encounter for screening for malignant neoplasm of prostate: Secondary | ICD-10-CM | POA: Diagnosis not present

## 2017-04-01 DIAGNOSIS — M79606 Pain in leg, unspecified: Secondary | ICD-10-CM | POA: Diagnosis not present

## 2017-04-01 DIAGNOSIS — Z1211 Encounter for screening for malignant neoplasm of colon: Secondary | ICD-10-CM | POA: Diagnosis not present

## 2017-04-01 DIAGNOSIS — Z23 Encounter for immunization: Secondary | ICD-10-CM | POA: Diagnosis not present

## 2017-04-01 DIAGNOSIS — I10 Essential (primary) hypertension: Secondary | ICD-10-CM | POA: Diagnosis not present

## 2017-04-01 DIAGNOSIS — Z0001 Encounter for general adult medical examination with abnormal findings: Secondary | ICD-10-CM | POA: Diagnosis not present

## 2017-04-01 DIAGNOSIS — Z1159 Encounter for screening for other viral diseases: Secondary | ICD-10-CM | POA: Diagnosis not present

## 2017-04-01 MED FILL — AMLODIPINE BESYLATE 5 MG TA: 5 | 90 days supply | Qty: 90 | Fill #0

## 2017-04-01 MED FILL — ETODOLAC 400 MG TABLET: 400 | 90 days supply | Qty: 180 | Fill #0

## 2017-04-23 MED FILL — GABAPENTIN 300 MG CAPSULE: 300 | 90 days supply | Qty: 90 | Fill #0

## 2017-04-30 MED FILL — DESONIDE 0.05% OINTMENT: 0.05 | 14 days supply | Qty: 60 | Fill #1

## 2017-06-04 MED FILL — KETOCONAZOLE 2% CREAM: 2 | 30 days supply | Qty: 30 | Fill #0

## 2017-06-04 MED FILL — DESONIDE 0.05% OINTMENT: 0.05 | 30 days supply | Qty: 60 | Fill #0

## 2017-07-01 MED FILL — AMLODIPINE BESYLATE 5 MG TA: 5 | 90 days supply | Qty: 90 | Fill #1

## 2017-07-01 MED FILL — ETODOLAC 400 MG TABLET: 400 | 90 days supply | Qty: 180 | Fill #1

## 2017-07-02 MED FILL — DESONIDE 0.05% OINTMENT: 0.05 | 30 days supply | Qty: 60 | Fill #1

## 2017-08-07 MED FILL — GABAPENTIN 300 MG CAPSULE: 300 | 90 days supply | Qty: 90 | Fill #1

## 2017-08-20 MED FILL — DESONIDE 0.05% OINTMENT: 0.05 | 15 days supply | Qty: 60 | Fill #0

## 2017-09-27 MED FILL — KETOCONAZOLE 2% CREAM: 2 | 30 days supply | Qty: 30 | Fill #1

## 2017-09-27 MED FILL — AMLODIPINE BESYLATE 5 MG TA: 5 | 90 days supply | Qty: 90 | Fill #2

## 2017-09-27 MED FILL — ETODOLAC 400 MG TABLET: 400 | 90 days supply | Qty: 180 | Fill #2

## 2017-10-08 MED FILL — DESONIDE 0.05% OINTMENT: 0.05 | 25 days supply | Qty: 60 | Fill #0

## 2017-10-17 DIAGNOSIS — M79606 Pain in leg, unspecified: Secondary | ICD-10-CM | POA: Diagnosis not present

## 2017-10-17 DIAGNOSIS — E119 Type 2 diabetes mellitus without complications: Secondary | ICD-10-CM | POA: Diagnosis not present

## 2017-10-17 DIAGNOSIS — I10 Essential (primary) hypertension: Secondary | ICD-10-CM | POA: Diagnosis not present

## 2017-10-17 DIAGNOSIS — B356 Tinea cruris: Secondary | ICD-10-CM | POA: Diagnosis not present

## 2017-10-17 DIAGNOSIS — Z23 Encounter for immunization: Secondary | ICD-10-CM | POA: Diagnosis not present

## 2017-11-04 MED FILL — GABAPENTIN 300 MG CAPSULE: 300 | 90 days supply | Qty: 90 | Fill #2

## 2017-12-03 MED FILL — DESONIDE 0.05% OINTMENT: 0.05 | 30 days supply | Qty: 60 | Fill #0

## 2017-12-16 MED FILL — AMLODIPINE BESYLATE 5 MG TA: 5 | 90 days supply | Qty: 90 | Fill #3

## 2017-12-30 MED FILL — ETODOLAC 400 MG TABLET: 400 | 90 days supply | Qty: 180 | Fill #3

## 2018-01-24 MED FILL — DESONIDE 0.05% OINTMENT: 0.05 | 30 days supply | Qty: 60 | Fill #0

## 2018-02-03 MED FILL — GABAPENTIN 300 MG CAPSULE: 300 | 90 days supply | Qty: 90 | Fill #3

## 2018-03-04 MED FILL — AMLODIPINE BESYLATE 5 MG TA: 5 | 90 days supply | Qty: 90 | Fill #0

## 2018-04-02 MED FILL — ETODOLAC 400 MG TABLET: 400 | 90 days supply | Qty: 180 | Fill #0

## 2018-04-02 MED FILL — GABAPENTIN 300 MG CAPSULE: 300 | 90 days supply | Qty: 90 | Fill #0

## 2018-04-02 MED FILL — DESONIDE 0.05% OINTMENT: 0.05 | 30 days supply | Qty: 60 | Fill #0

## 2018-05-30 MED FILL — AMLODIPINE BESYLATE 5 MG TA: 5 | 90 days supply | Qty: 90 | Fill #0

## 2018-06-10 MED FILL — DESONIDE 0.05% OINTMENT: 0.05 | 10 days supply | Qty: 60 | Fill #0

## 2018-08-06 MED FILL — GABAPENTIN 300 MG CAPSULE: 300 | 90 days supply | Qty: 90 | Fill #1

## 2018-08-06 MED FILL — ETODOLAC 400 MG TABS: 400 | 30 days supply | Qty: 60 | Fill #1

## 2018-08-06 MED FILL — DESONIDE 0.05% OINTMENT: 0.05 | 5 days supply | Qty: 30 | Fill #0

## 2018-09-03 MED FILL — DESONIDE 0.05% OINTMENT: 0.05 | 15 days supply | Qty: 30 | Fill #1

## 2018-11-12 MED FILL — GABAPENTIN 300 MG CAPSULE: 300 | 90 days supply | Qty: 90 | Fill #0

## 2018-11-12 MED FILL — ETODOLAC 400 MG TABS: 400 | 30 days supply | Qty: 60 | Fill #2

## 2018-11-13 MED FILL — DESONIDE 0.05 % OINT: 0.05 | 14 days supply | Qty: 60 | Fill #0

## 2019-01-28 MED FILL — ETODOLAC 400 MG TABS: 400 | 30 days supply | Qty: 60 | Fill #3

## 2019-01-28 MED FILL — DESONIDE 0.05 % OINT: 0.05 | 14 days supply | Qty: 60 | Fill #0

## 2019-04-29 MED FILL — DESONIDE 0.05 % OINT: 0.05 | 14 days supply | Qty: 60 | Fill #0

## 2019-04-29 MED FILL — GABAPENTIN 300 MG CAPSULE: 300 | 90 days supply | Qty: 90 | Fill #1

## 2019-07-22 ENCOUNTER — Other Ambulatory Visit: Payer: Self-pay

## 2019-07-22 ENCOUNTER — Emergency Department (HOSPITAL_COMMUNITY): Payer: Self-pay

## 2019-07-22 ENCOUNTER — Encounter (HOSPITAL_COMMUNITY): Payer: Self-pay | Admitting: Pediatrics

## 2019-07-22 ENCOUNTER — Inpatient Hospital Stay (HOSPITAL_COMMUNITY): Payer: Self-pay

## 2019-07-22 ENCOUNTER — Observation Stay (HOSPITAL_COMMUNITY)
Admission: EM | Admit: 2019-07-22 | Discharge: 2019-07-23 | Disposition: A | Discharge status: Home / Self Care | Payer: Self-pay | Attending: Internal Medicine | Admitting: Internal Medicine

## 2019-07-22 DIAGNOSIS — I639 Cerebral infarction, unspecified: Principal | ICD-10-CM | POA: Insufficient documentation

## 2019-07-22 DIAGNOSIS — Z7289 Other problems related to lifestyle: Secondary | ICD-10-CM

## 2019-07-22 DIAGNOSIS — F1090 Alcohol use, unspecified, uncomplicated: Secondary | ICD-10-CM

## 2019-07-22 DIAGNOSIS — Z87891 Personal history of nicotine dependence: Secondary | ICD-10-CM | POA: Insufficient documentation

## 2019-07-22 DIAGNOSIS — Z789 Other specified health status: Secondary | ICD-10-CM

## 2019-07-22 DIAGNOSIS — Z8673 Personal history of transient ischemic attack (TIA), and cerebral infarction without residual deficits: Secondary | ICD-10-CM

## 2019-07-22 DIAGNOSIS — I6340 Cerebral infarction due to embolism of unspecified cerebral artery: Secondary | ICD-10-CM

## 2019-07-22 DIAGNOSIS — I6501 Occlusion and stenosis of right vertebral artery: Secondary | ICD-10-CM | POA: Diagnosis present

## 2019-07-22 DIAGNOSIS — Z20822 Contact with and (suspected) exposure to covid-19: Secondary | ICD-10-CM | POA: Insufficient documentation

## 2019-07-22 DIAGNOSIS — Z7982 Long term (current) use of aspirin: Secondary | ICD-10-CM | POA: Insufficient documentation

## 2019-07-22 DIAGNOSIS — Z79899 Other long term (current) drug therapy: Secondary | ICD-10-CM | POA: Insufficient documentation

## 2019-07-22 DIAGNOSIS — D696 Thrombocytopenia, unspecified: Secondary | ICD-10-CM | POA: Diagnosis present

## 2019-07-22 DIAGNOSIS — I10 Essential (primary) hypertension: Secondary | ICD-10-CM | POA: Diagnosis present

## 2019-07-22 LAB — CBC
HCT: 49.3 % (ref 39.0–52.0)
Hemoglobin: 16.7 g/dL (ref 13.0–17.0)
MCH: 33 pg (ref 26.0–34.0)
MCHC: 33.9 g/dL (ref 30.0–36.0)
MCV: 97.4 fL (ref 80.0–100.0)
Platelets: 110 10*3/uL — ABNORMAL LOW (ref 150–400)
RBC: 5.06 MIL/uL (ref 4.22–5.81)
RDW: 13 % (ref 11.5–15.5)
WBC: 5.2 10*3/uL (ref 4.0–10.5)
nRBC: 0 % (ref 0.0–0.2)

## 2019-07-22 LAB — DIFFERENTIAL
Abs Immature Granulocytes: 0.01 10*3/uL (ref 0.00–0.07)
Basophils Absolute: 0 10*3/uL (ref 0.0–0.1)
Basophils Relative: 0 %
Eosinophils Absolute: 0.1 10*3/uL (ref 0.0–0.5)
Eosinophils Relative: 2 %
Immature Granulocytes: 0 %
Lymphocytes Relative: 26 %
Lymphs Abs: 1.3 10*3/uL (ref 0.7–4.0)
Monocytes Absolute: 0.5 10*3/uL (ref 0.1–1.0)
Monocytes Relative: 9 %
Neutro Abs: 3.3 10*3/uL (ref 1.7–7.7)
Neutrophils Relative %: 63 %

## 2019-07-22 LAB — COMPREHENSIVE METABOLIC PANEL
ALT: 26 U/L (ref 0–44)
AST: 39 U/L (ref 15–41)
Albumin: 3.9 g/dL (ref 3.5–5.0)
Alkaline Phosphatase: 56 U/L (ref 38–126)
Anion gap: 10 (ref 5–15)
BUN: 9 mg/dL (ref 8–23)
CO2: 26 mmol/L (ref 22–32)
Calcium: 9.3 mg/dL (ref 8.9–10.3)
Chloride: 104 mmol/L (ref 98–111)
Creatinine, Ser: 0.85 mg/dL (ref 0.61–1.24)
GFR calc Af Amer: >60 mL/min (ref >60)
GFR calc non Af Amer: >60 mL/min (ref >60)
Glucose, Bld: 152 mg/dL — ABNORMAL HIGH (ref 70–99)
Potassium: 3.7 mmol/L (ref 3.5–5.1)
Sodium: 140 mmol/L (ref 135–145)
Total Bilirubin: 0.9 mg/dL (ref 0.3–1.2)
Total Protein: 7.5 g/dL (ref 6.5–8.1)

## 2019-07-22 LAB — HIV ANTIBODY (ROUTINE TESTING W REFLEX): HIV Screen 4th Generation wRfx: NONREACTIVE

## 2019-07-22 LAB — I-STAT CHEM 8, ED
BUN: 11 mg/dL (ref 8–23)
Calcium, Ion: 1.17 mmol/L (ref 1.15–1.40)
Chloride: 103 mmol/L (ref 98–111)
Creatinine, Ser: 0.8 mg/dL (ref 0.61–1.24)
Glucose, Bld: 150 mg/dL — ABNORMAL HIGH (ref 70–99)
HCT: 50 % (ref 39.0–52.0)
Hemoglobin: 17 g/dL (ref 13.0–17.0)
Potassium: 3.6 mmol/L (ref 3.5–5.1)
Sodium: 144 mmol/L (ref 135–145)
TCO2: 27 mmol/L (ref 22–32)

## 2019-07-22 LAB — PROTIME-INR
INR: 1 (ref 0.8–1.2)
Prothrombin Time: 13.1 seconds (ref 11.4–15.2)

## 2019-07-22 LAB — APTT: aPTT: 28 seconds (ref 24–36)

## 2019-07-22 MED ORDER — ENOXAPARIN SODIUM 40 MG/0.4ML ~~LOC~~ SOLN
40.0000 mg | SUBCUTANEOUS | Status: DC
  Administered 2019-07-22: 40 mg via SUBCUTANEOUS
  Filled 2019-07-22: qty 0.4

## 2019-07-22 MED ORDER — IOHEXOL 350 MG/ML SOLN
50.0000 mL | Freq: Once | INTRAVENOUS | Status: AC | PRN
  Administered 2019-07-22: 50 mL via INTRAVENOUS

## 2019-07-22 MED ORDER — STROKE: EARLY STAGES OF RECOVERY BOOK
Freq: Once | Status: AC
  Filled 2019-07-22: qty 1

## 2019-07-22 MED ORDER — ACETAMINOPHEN 325 MG PO TABS: 650.0000 mg | ORAL_TABLET | ORAL | Status: DC | PRN

## 2019-07-22 MED ORDER — LORAZEPAM 2 MG/ML IJ SOLN: 1.0000 mg | INTRAMUSCULAR | Status: DC | PRN

## 2019-07-22 MED ORDER — LORAZEPAM 1 MG PO TABS: 1.0000 mg | ORAL_TABLET | ORAL | Status: DC | PRN

## 2019-07-22 MED ORDER — ATORVASTATIN CALCIUM 80 MG PO TABS
80.0000 mg | ORAL_TABLET | Freq: Every day | ORAL | Status: DC
  Administered 2019-07-22 – 2019-07-23 (×2): 80 mg via ORAL
  Filled 2019-07-22 (×2): qty 1

## 2019-07-22 MED ORDER — ACETAMINOPHEN 650 MG RE SUPP: 650.0000 mg | RECTAL | Status: DC | PRN

## 2019-07-22 MED ORDER — SENNOSIDES-DOCUSATE SODIUM 8.6-50 MG PO TABS: 1.0000 | ORAL_TABLET | Freq: Every evening | ORAL | Status: DC | PRN

## 2019-07-22 MED ORDER — ACETAMINOPHEN 160 MG/5ML PO SOLN: 650.0000 mg | ORAL | Status: DC | PRN

## 2019-07-22 MED ORDER — SODIUM CHLORIDE 0.9% FLUSH: 3.0000 mL | Freq: Once | INTRAVENOUS | Status: DC

## 2019-07-22 MED ORDER — ASPIRIN EC 81 MG PO TBEC
81.0000 mg | DELAYED_RELEASE_TABLET | Freq: Every day | ORAL | Status: DC
  Administered 2019-07-22: 81 mg via ORAL
  Filled 2019-07-22: qty 1

## 2019-07-22 NOTE — Consult Note (Signed)
Referring Physician: Dr. Cyndia Bent    Chief Complaint: Acute onset of left sided numbness  HPI: Larry Houston is an 64 y.o. male with a PMHx of lumbar spinal stenosis s/p surgery approximately 5 years ago, EtOH abuse, tobacco abuse, HTN and RLE venous malformation, who presented to the ED today with left sided numbness and tingling beginning yesterday at 10 AM. The symptoms were still present on awakening this AM. On arrival to the ED, he was ambulatory with slurred speech, alert and fully oriented. He described the paresthesias as being accentuated by touch, first beginning around his left bicep and elbow, then spreading to his left hand, left ear and entire LLE. He denied any associated weakness.   He also complained of a headache that resolved with OTC medication, nausea and sensation of being off balance. He has had no presyncope/syncope, vision changes, speech deficit, neck pain or CP.   He is not on any antiplatelet medication or anticoagulation.     MRI brain obtained in the ED revealed a wedge-shaped, right paramedian pontine acute ischemic infarction. Punctate left corona radiata and right cerebellar acute infarctions were also noted.   MRI brain:  1. 6 mm acute infarct within the left corona radiata. 2. 9 mm acute infarct within the paramedian right pons. 3. Punctate acute infarct within the superior cerebellum just right of midline. 4. A 4 mm subacute infarct is questioned within the subcortical left frontal lobe. 5. Background mild chronic small vessel ischemic changes within the cerebral white matter. 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 A small to moderate-sized chronic infarct is also present within the right cerebellum. 6. Mild paranasal sinus mucosal thickening.  LSN: 1000 on Tuesday tPA Given: No: Out of the time window  Past Medical History:  Diagnosis Date  . Alcohol abuse   . Arthritis    lumbar stenosis   . Hypertension    . Venous malformation 09/03/2016   Right lower extremity venous malformation    Past Surgical History:  Procedure Laterality Date  . IR RADIOLOGIST EVAL & MGMT  04/05/2016  . IR RADIOLOGIST EVAL & MGMT  10/17/2016  . IR RADIOLOGIST EVAL & MGMT  12/05/2016  . IR SCLEROTHERAPY OF A FLUID COLLECTION Right 09/13/2016   Venous malformation [Q27.9]   RLE  . IR SCLEROTHERAPY OF A FLUID COLLECTION  09/13/2016  . IR US GUIDE VASC ACCESS RIGHT  09/13/2016  . LUMBAR LAMINECTOMY/DECOMPRESSION MICRODISCECTOMY Bilateral 01/26/2015   Procedure: Laminectomy and Foraminotomy - L4-L5 - bilateral;  Surgeon: Tia Alert, MD;  Location: [facility];  Service: Neurosurgery;  Laterality: Bilateral;  Laminectomy and Foraminotomy - L4-L5 - bilateral  . NASAL ENDOSCOPY WITH EPISTAXIS CONTROL N/A 04/04/2014   Procedure: NASAL ENDOSCOPY WITH EPISTAXIS CONTROL;  Surgeon: Melvenia Beam, MD;  Location[facility];  Service: ENT;  Laterality: N/A;  . NECK SURGERY  1992   posterior fusion- cervical   . RADIOLOGY WITH ANESTHESIA N/A 09/13/2016   Procedure: percutaneous sclerotherapy with dehydrated ethanol;  Surgeon: Malachy Moan, MD;  Location: [facility] OR;  Service: Radiology;  Laterality: N/A;    Family History  Problem Relation Age of Onset  . Diabetes Mother   . Hypertension Mother   . Anemia Mother        Died of aplastic anemia  . Lung disease Father   . Heart attack Brother   . Heart disease Sister        One sister had heart arrest   Social  History:  reports that he quit smoking about 13 years ago. He quit after 7.00 years of use. He has never used smokeless tobacco. He reports current alcohol use. He reports that he does not use drugs.  Allergies:  Allergies  Allergen Reactions  . Penicillins Hives    Has patient had a PCN reaction causing immediate rash, facial/tongue/throat swelling, SOB or lightheadedness with hypotension: No Has patient had a PCN reaction causing severe rash involving mucus membranes or  skin necrosis: No Has patient had a PCN reaction that required hospitalization No Has patient had a PCN reaction occurring within the last 10 years: No If all of the above answers are "NO", then may proceed with Cephalosporin use.    Home Medications:  No current facility-administered medications on file prior to encounter.   Current Outpatient Medications on File Prior to Encounter  Medication Sig Dispense Refill  . aspirin-acetaminophen-caffeine (EXCEDRIN EXTRA STRENGTH) 250-250-65 MG tablet Take 1 tablet by mouth every 6 (six) hours as needed (for pain).    . Aspirin-Caffeine (BAYER BACK & BODY PAIN EX ST) 500-32.5 MG TABS Take 2 tablets by mouth every 6 (six) hours as needed (for pain).    Marland Kitchen desonide (DESOWEN) 0.05 % ointment Apply 1 application topically 2 (two) times daily as needed (to groin).     Marland Kitchen diphenhydramine-acetaminophen (TYLENOL PM) 25-500 MG TABS tablet Take 1 tablet by mouth at bedtime as needed (for sleep).    Marland Kitchen etodolac (LODINE) 400 MG tablet Take 400 mg by mouth daily as needed (for pain).    Marland Kitchen gabapentin (NEURONTIN) 300 MG capsule Take 1 capsule (300 mg total) by mouth 3 (three) times daily. (Patient taking differently: Take 300 mg by mouth daily as needed (for neuropathy). ) 90 capsule 1  . ibuprofen (ADVIL,MOTRIN) 200 MG tablet Take 400 mg by mouth every 6 (six) hours as needed for headache or mild pain.     Marland Kitchen amLODipine (NORVASC) 5 MG tablet Take 5 mg by mouth daily before breakfast.  (Patient not taking: Reported on 07/22/2019)    . doxycycline (VIBRAMYCIN) 100 MG capsule Take 1 capsule (100 mg total) by mouth 2 (two) times daily. (Patient not taking: Reported on 07/22/2019) 14 capsule 0  . econazole nitrate 1 % cream Apply 1 application topically daily as needed (affected area).  (Patient not taking: Reported on 07/22/2019)  0  . sildenafil (VIAGRA) 100 MG tablet Take 1-2 tablets by mouth daily as needed. (Patient not taking: Reported on 07/22/2019)  1     ROS: As per  HPI. Comprehensive ROS otherwise negative.   Physical Examination: Blood pressure (!) 181/102, pulse 73, temperature 98.6 F (37 C), temperature source Oral, resp. rate 15, height 6\' 1"  (1.854 m), weight 93 kg, SpO2 99 %.   HEENT: [place]/AT Lungs: Respirations unlabored.  Ext: No edema  Neurologic Examination: Mental Status: Alert, fully oriented, thought content appropriate.  Speech fluent without evidence of aphasia.  Able to follow all commands without difficulty. No dysarthria.  Cranial Nerves: II:  Visual fields intact with no extinction to DSS. PERRL.  III,IV, VI: No ptosis, EOMI.   V,VII: Smile symmetric, facial temp sensation equal bilaterally VIII: Hearing intact to voice IX,X: No hypophonia XI: Symmetric shoulder shrug XII: midline tongue extension  Motor: Right : Upper extremity   5/5    Left:     Upper extremity   5/5  Lower extremity   5/5     Lower extremity   5/5 Tone and bulk:normal tone  throughout; no atrophy noted Sensory: Temp and light touch intact throughout, bilaterally, including in the distribution of the left sided paresthesias. No extinction to DSS.  Deep Tendon Reflexes:  Normoactive x 4.  Cerebellar: No ataxia with FNF bilaterally.  Gait: Deferred  Results for orders placed or performed during the hospital encounter of 07/22/19 (from the past 48 hour(s))  Protime-INR     Status: None   Collection Time: 07/22/19 11:31 AM  Result Value Ref Range   Prothrombin Time 13.1 11.4 - 15.2 seconds   INR 1.0 0.8 - 1.2    Comment: (NOTE) INR goal varies based on device and disease states. Performed at [facility], [address] [place], [place] [address]   APTT     Status: None   Collection Time: 07/22/19 11:31 AM  Result Value Ref Range   aPTT 28 24 - 36 seconds    Comment: Performed at [facility], [address] [place], [place] [address]  CBC     Status: Abnormal   Collection Time: 07/22/19 11:31 AM  Result Value Ref Range   WBC 5.2 4.0  - 10.5 K/uL   RBC 5.06 4.22 - 5.81 MIL/uL   Hemoglobin 16.7 13.0 - 17.0 g/dL   HCT 19.149.3 39 - 52 %   MCV 97.4 80.0 - 100.0 fL   MCH 33.0 26.0 - 34.0 pg   MCHC 33.9 30.0 - 36.0 g/dL   RDW 47.813.0 29.511.5 - 62.115.5 %   Platelets 110 (L) 150 - 400 K/uL    Comment: Immature Platelet Fraction may be clinically indicated, consider ordering this additional test [id number] PLATELET COUNT CONFIRMED BY SMEAR REPEATED TO VERIFY    nRBC 0.0 0.0 - 0.2 %    Comment: Performed at [facility], [address] [place], [place] [address]  Differential     Status: None   Collection Time: 07/22/19 11:31 AM  Result Value Ref Range   Neutrophils Relative % 63 %   Neutro Abs 3.3 1.7 - 7.7 K/uL   Lymphocytes Relative 26 %   Lymphs Abs 1.3 0.7 - 4.0 K/uL   Monocytes Relative 9 %   Monocytes Absolute 0.5 0 - 1 K/uL   Eosinophils Relative 2 %   Eosinophils Absolute 0.1 0 - 0 K/uL   Basophils Relative 0 %   Basophils Absolute 0.0 0 - 0 K/uL   Immature Granulocytes 0 %   Abs Immature Granulocytes 0.01 0.00 - 0.07 K/uL    Comment: Performed at [facility], [address] [place], [place] [address]  Comprehensive metabolic panel     Status: Abnormal   Collection Time: 07/22/19 11:31 AM  Result Value Ref Range   Sodium 140 135 - 145 mmol/L   Potassium 3.7 3.5 - 5.1 mmol/L   Chloride 104 98 - 111 mmol/L   CO2 26 22 - 32 mmol/L   Glucose, Bld 152 (H) 70 - 99 mg/dL    Comment: Glucose reference range applies only to samples taken after fasting for at least 8 hours.   BUN 9 8 - 23 mg/dL   Creatinine, Ser 3.240.85 0.61 - 1.24 mg/dL   Calcium 9.3 8.9 - 40.110.3 mg/dL   Total Protein 7.5 6.5 - 8.1 g/dL   Albumin 3.9 3.5 - 5.0 g/dL   AST 39 15 - 41 U/L   ALT 26 0 - 44 U/L   Alkaline Phosphatase 56 38 - 126 U/L   Total Bilirubin 0.9 0.3 -  1.2 mg/dL   GFR calc non Af Amer >60 >60 mL/min   GFR calc Af Amer >60 >60 mL/min   Anion gap 10 5 - 15    Comment: Performed at [facility], [address],  [place], [place] [address]  I-stat chem 8, ED     Status: Abnormal   Collection Time: 07/22/19 12:00 PM  Result Value Ref Range   Sodium 144 135 - 145 mmol/L   Potassium 3.6 3.5 - 5.1 mmol/L   Chloride 103 98 - 111 mmol/L   BUN 11 8 - 23 mg/dL   Creatinine, Ser 6.04 0.61 - 1.24 mg/dL   Glucose, Bld 540 (H) 70 - 99 mg/dL    Comment: Glucose reference range applies only to samples taken after fasting for at least 8 hours.   Calcium, Ion 1.17 1.15 - 1.40 mmol/L   TCO2 27 22 - 32 mmol/L   Hemoglobin 17.0 13.0 - 17.0 g/dL   HCT 98.1 39 - 52 %   CT HEAD WO CONTRAST  Result Date: 07/22/2019 CLINICAL DATA:  Onset left side tingling yesterday at 10 a.m. which progressed to numbness this morning. EXAM: CT HEAD WITHOUT CONTRAST TECHNIQUE: Contiguous axial images were obtained from the base of the skull through the vertex without intravenous contrast. COMPARISON:  None. FINDINGS: Brain: No evidence of acute infarction, hemorrhage, hydrocephalus, extra-axial collection or mass lesion/mass effect. Vascular: No hyperdense vessel or unexpected calcification. Skull: Normal. Negative for fracture or focal lesion. Sinuses/Orbits: Status post right maxillary antrostomy. Tiny mucous retention cyst or polyp right sphenoid sinus noted. Other: None. IMPRESSION: No acute intracranial abnormality. Electronically Signed   By: Drusilla Kanner M.D.   On: 07/22/2019 13:24   MR BRAIN WO CONTRAST  Result Date: 07/22/2019 CLINICAL DATA:  Provided history: Numbness left side ear, arm, leg, feeling off balance. EXAM: MRI HEAD WITHOUT CONTRAST TECHNIQUE: Multiplanar, multiecho pulse sequences of the brain and surrounding structures were obtained without intravenous contrast. COMPARISON:  Head CT 07/22/2019. FINDINGS: Brain: Cerebral volume is normal for age. There is a 6 mm acute infarct within the left corona radiata (series 12, image 83). There is an additional 4 mm vague focus of diffusion weighted hyperintensity within the  subcortical left frontal lobe without definite ADC correlate, which may reflect a subacute infarct (series 12, image 87). 9 mm acute infarct within the paramedian right pons (series 12, image 65). Punctate acute infarct within the superior cerebellum just to the right of midline (series 12, image 68). 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 Small to moderate-sized chronic infarct within the right cerebellum. Background mild patchy T2/FLAIR hyperintensity within the cerebral white matter is nonspecific, but consistent with chronic small vessel ischemic disease. No evidence of intracranial mass. No chronic intracranial blood products. No extra-axial fluid collection. No midline shift. Vascular: Expected proximal arterial flow voids. Skull and upper cervical spine: No focal marrow lesion. Sinuses/Orbits: Visualized orbits show no acute finding. Mild paranasal sinus mucosal thickening, most notably ethmoid. No significant mastoid effusion. IMPRESSION: 1. 6 mm acute infarct within the left corona radiata. 2. 9 mm acute infarct within the paramedian right pons. 3. Punctate acute infarct within the superior cerebellum just right of midline. 4. A 4 mm subacute infarct is questioned within the subcortical left frontal lobe. 5. Background mild chronic small vessel ischemic changes within the cerebral white matter. 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 A small to moderate-sized chronic infarct is also  present within the right cerebellum. 6. Mild paranasal sinus mucosal thickening. Electronically Signed   By: Jackey Loge DO   On: 07/22/2019 17:32    Assessment: 64 y.o. male with acute right paramedian pontine stroke, as well as smaller acute punctate infarct in the left corona radiata and right paramedian superior cerebellum. Suspect a cardioembolic etiology.  1. Exam reveals no objective focal sensory or motor deficit. Symptomatically, the  patient continues to have subjective left sided paresthesias. 2. 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 The bilateral common carotid, internal carotid and left vertebral arteries are patent within the neck without stenosis. Mild atherosclerotic plaque within the visualized aortic arch, proximal major branch vessels of the neck and left carotid bifurcation. 3. CTA head: Opacification of the intracranial right vertebral artery and right PICA, which may be due to retrograde flow. The non dominant intracranial left vertebral artery is developmentally diminutive, but patent. This vessel is small and irregular beyond the origin of the left PICA and this may be developmental. A significant stenosis at this site cannot be excluded. Elsewhere, there is no intracranial large vessel occlusion or proximal high-grade arterial stenosis. 4. Stroke Risk Factors - HTN and tobacco abuse  Recommendations: 1. Change ASA 81 mg to 325 mg PO QD (ordered). Per recent studies, there is no significant difference in outcomes for patients with intracranial and extracranial vertebral artery dissection when treated with ASA versus oral anticoagulation.  2. TTE 3. Cardiac telemetry 4. PT consult, OT consult, Speech consult 5. Risk factor modification 6. Frequent neuro checks 7. HgbA1c, fasting lipid panel 8. SBP goal of 140-180 for now.     signed: Dr. Caryl Pina 07/22/2019, 8:27 PM

## 2019-07-22 NOTE — ED Notes (Signed)
The pt has entire lt sided numbness  For months intermittently  Worse since yesterday he had a headache earlier today none now.   . He had surgery for spinal stenosis  2 years ago  And he was told then that the sensation might come back  He cannot travel by car any longer on long trips due to pain and numbness in his lt side a and o x 3

## 2019-07-22 NOTE — ED Triage Notes (Signed)
Patient arrived via ems; c/o left sided numbness. Stated started as tingling yesterday at 10 AM and when he woke up this morning it was numb. Patient is ambulatory w/o slurred speech; A&x4. Patient reported hx of back surgery 5 years ago d/t spinal stenosis.

## 2019-07-22 NOTE — ED Notes (Signed)
The pt stopped taking his bp medicine one year ago

## 2019-07-22 NOTE — ED Provider Notes (Addendum)
[facility] EMERGENCY DEPARTMENT Provider Note   CSN: [id number] Arrival date & time: 07/22/19  1115     History Chief Complaint  Patient presents with  . Numbness    Larry Houston is a 64 y.o. male with history of hypertension, tobacco use presents to the ED for evaluation of left-sided numbness and tingling that began at 10 AM yesterday.  This first began around his left bicep and elbow and has since spread to his left hand, left ear, entire left leg including the toe.  He describes a sensation as feeling "tingly" like pins-and-needles when he touches the areas but also like he can feel as well in the areas.  It has been constant.  In the last 24 hours he has had a headache that resolved after over-the-counter medicines, nausea and feeling like he is off balance.  He feels like he had to really think to make his foot go in the direction he wanted to go but denies any foot drag or left-sided weakness.  Denies any associated lightheadedness, chest pain or syncope but feels like his balance is just a little off.  Denies visual changes, difficulty with speech, one-sided drooping or weakness.  Patient thinks his symptoms may be due to his spinal stenosis.  Reports several years ago he had low back pain and had surgery for it.  Ever since he has had bilateral buttock and upper leg cramping and pain.  This is usually worse if he walks or sits down for too long.  However, states this is at his baseline and not any worse.  No neck pain.  HPI     Past Medical History:  Diagnosis Date  . Alcohol abuse   . Arthritis    lumbar stenosis   . Hypertension   . Venous malformation 09/03/2016   Right lower extremity venous malformation    Patient Active Problem List   Diagnosis Date Noted  . Venous malformation 09/13/2016  . Cellulitis of groin 02/18/2015  . Abscess of groin 02/18/2015  . Sepsis (HCC) 02/18/2015  . Scrotal abscess   . S/P lumbar laminectomy 01/26/2015  .  Essential hypertension 04/04/2014  . Epistaxis 04/04/2014  . Alcohol use (HCC) 04/04/2014  . Thrombocytopenia (HCC) 04/04/2014    Past Surgical History:  Procedure Laterality Date  . IR RADIOLOGIST EVAL & MGMT  04/05/2016  . IR RADIOLOGIST EVAL & MGMT  10/17/2016  . IR RADIOLOGIST EVAL & MGMT  12/05/2016  . IR SCLEROTHERAPY OF A FLUID COLLECTION Right 09/13/2016   Venous malformation [Q27.9]   RLE  . IR SCLEROTHERAPY OF A FLUID COLLECTION  09/13/2016  . IR US GUIDE VASC ACCESS RIGHT  09/13/2016  . LUMBAR LAMINECTOMY/DECOMPRESSION MICRODISCECTOMY Bilateral 01/26/2015   Procedure: Laminectomy and Foraminotomy - L4-L5 - bilateral;  Surgeon: Tia Alert, MD;  Location: [facility];  Service: Neurosurgery;  Laterality: Bilateral;  Laminectomy and Foraminotomy - L4-L5 - bilateral  . NASAL ENDOSCOPY WITH EPISTAXIS CONTROL N/A 04/04/2014   Procedure: NASAL ENDOSCOPY WITH EPISTAXIS CONTROL;  Surgeon: Melvenia Beam, MD;  Location: [facility];  Service: ENT;  Laterality: N/A;  . NECK SURGERY  1992   posterior fusion- cervical   . RADIOLOGY WITH ANESTHESIA N/A 09/13/2016   Procedure: percutaneous sclerotherapy with dehydrated ethanol;  Surgeon: Malachy Moan, MD;  Location: [facility] OR;  Service: Radiology;  Laterality: N/A;       Family History  Problem Relation Age of Onset  . Diabetes Mother   . Hypertension Mother   .  Anemia Mother        Died of aplastic anemia  . Lung disease Father   . Heart attack Brother   . Heart disease Sister        One sister had heart arrest    Social History   Tobacco Use  . Smoking status: Former Smoker    Years: 7.00    Quit date: 01/13/2006    Years since quitting: 13.5  . Smokeless tobacco: Never Used  Vaping Use  . Vaping Use: Never used  Substance Use Topics  . Alcohol use: Yes    Comment: occasional   . Drug use: No    Home Medications Prior to Admission medications   Medication Sig Start Date End Date Taking? Authorizing Provider   aspirin-acetaminophen-caffeine (EXCEDRIN EXTRA STRENGTH) [id number] MG tablet Take 1 tablet by mouth every 6 (six) hours as needed (for pain).   Yes [provider]  Aspirin-Caffeine (BAYER BACK & BODY PAIN EX ST) 500-32.5 MG TABS Take 2 tablets by mouth every 6 (six) hours as needed (for pain).   Yes [provider]  desonide (DESOWEN) 0.05 % ointment Apply 1 application topically 2 (two) times daily as needed (to groin).  04/29/19  Yes [provider]  diphenhydramine-acetaminophen (TYLENOL PM) 25-500 MG TABS tablet Take 1 tablet by mouth at bedtime as needed (for sleep).   Yes [provider]  etodolac (LODINE) 400 MG tablet Take 400 mg by mouth daily as needed (for pain).   Yes [provider]  gabapentin (NEURONTIN) 300 MG capsule Take 1 capsule (300 mg total) by mouth 3 (three) times daily. Patient taking differently: Take 300 mg by mouth daily as needed (for neuropathy).  04/05/16  Yes Tyrell Antonio, MD  ibuprofen (ADVIL,MOTRIN) 200 MG tablet Take 400 mg by mouth every 6 (six) hours as needed for headache or mild pain.    Yes [provider]  amLODipine (NORVASC) 5 MG tablet Take 5 mg by mouth daily before breakfast.  Patient not taking: Reported on 07/22/2019    [provider]  doxycycline (VIBRAMYCIN) 100 MG capsule Take 1 capsule (100 mg total) by mouth 2 (two) times daily. Patient not taking: Reported on 07/22/2019 02/20/15   Leatha Gilding, MD  econazole nitrate 1 % cream Apply 1 application topically daily as needed (affected area).  Patient not taking: Reported on 07/22/2019 12/27/14   [provider]  sildenafil (VIAGRA) 100 MG tablet Take 1-2 tablets by mouth daily as needed. Patient not taking: Reported on 07/22/2019 09/05/16   [provider]    Allergies    Penicillins  Review of Systems   Review of Systems  Gastrointestinal: Positive for nausea.  Neurological: Positive for weakness (Tingling)  and headaches (Resolved).  All other systems reviewed and are negative.   Physical Exam Updated Vital Signs BP (!) 162/84 (BP Location: Left Arm)   Pulse 75   Temp 98.6 F (37 C) (Oral)   Resp 15   Ht 6\' 1"  (1.854 m)   Wt 93 kg   SpO2 97%   BMI 27.05 kg/m   Physical Exam Vitals and nursing note reviewed.  Constitutional:      General: He is not in acute distress.    Appearance: He is well-developed.     Comments: NAD.  HENT:     Head: Normocephalic and atraumatic.     Right Ear: External ear normal.     Left Ear: External ear normal.     Nose:  Nose normal.  Eyes:     General: No scleral icterus.    Conjunctiva/sclera: Conjunctivae normal.  Cardiovascular:     Rate and Rhythm: Normal rate and regular rhythm.     Heart sounds: Normal heart sounds.  Pulmonary:     Effort: Pulmonary effort is normal.     Breath sounds: Normal breath sounds.  Musculoskeletal:        General: No deformity. Normal range of motion.     Cervical back: Normal range of motion and neck supple.  Skin:    General: Skin is warm and dry.     Capillary Refill: Capillary refill takes less than 2 seconds.  Neurological:     Mental Status: He is alert and oriented to person, place, and time.     Comments:   Mental Status: Patient is awake, alert, oriented to person, place, year, and situation. Patient is able to give a clear and coherent history.  Speech is fluent and clear without dysarthria or aphasia. No signs of neglect.  Cranial Nerves: I not tested II visual fields full bilaterally. PERRL.  Unable to visualize posterior eye. III, IV, VI EOMs intact without ptosis or diplopia  V sensation to light touch intact in all 3 divisions of trigeminal nerve bilaterally  VII facial movements symmetric bilaterally VIII hearing intact to voice/conversation  IX, X no uvula deviation, symmetric rise of soft palate/uvula XI 5/5 SCM and trapezius strength bilaterally  XII tongue protrusion midline,  symmetric L/R movements  Motor: Strength 5/5 in upper/lower extremities. Sensation to light touch intact in face, upper/lower extremities. No pronator drift. No leg drop.  Cerebellar:  Initial ataxia with FTN (LEFT), patient's mask removed and this improved. Patient able to sit up, ambulate without obvious ataxia  Psychiatric:        Behavior: Behavior normal.        Thought Content: Thought content normal.        Judgment: Judgment normal.     ED Results / Procedures / Treatments   Labs (all labs ordered are listed, but only abnormal results are displayed) Labs Reviewed  CBC - Abnormal; Notable for the following components:      Result Value   Platelets 110 (*)    All other components within normal limits  COMPREHENSIVE METABOLIC PANEL - Abnormal; Notable for the following components:   Glucose, Bld 152 (*)    All other components within normal limits  I-STAT CHEM 8, ED - Abnormal; Notable for the following components:   Glucose, Bld 150 (*)    All other components within normal limits  PROTIME-INR  APTT  DIFFERENTIAL  CBG MONITORING, ED    EKG EKG Interpretation  Date/Time:  Wednesday July 22 2019 11:26:38 EDT Ventricular Rate:  104 PR Interval:  178 QRS Duration: 88 QT Interval:  360 QTC Calculation: 473 R Axis:   5 Text Interpretation: Sinus tachycardia Nonspecific T wave abnormality Abnormal ECG Confirmed by Margarita Grizzle [phone]) on 07/22/2019 3:20:13 PM   Radiology CT HEAD WO CONTRAST  Result Date: 07/22/2019 CLINICAL DATA:  Onset left side tingling yesterday at 10 a.m. which progressed to numbness this morning. EXAM: CT HEAD WITHOUT CONTRAST TECHNIQUE: Contiguous axial images were obtained from the base of the skull through the vertex without intravenous contrast. COMPARISON:  None. FINDINGS: Brain: No evidence of acute infarction, hemorrhage, hydrocephalus, extra-axial collection or mass lesion/mass effect. Vascular: No hyperdense vessel or unexpected  calcification. Skull: Normal. Negative for fracture or focal lesion. Sinuses/Orbits: Status post right  maxillary antrostomy. Tiny mucous retention cyst or polyp right sphenoid sinus noted. Other: None. IMPRESSION: No acute intracranial abnormality. Electronically Signed   By: Drusilla Kannerhomas  Dalessio M.D.   On: 07/22/2019 13:24   MR BRAIN WO CONTRAST  Result Date: 07/22/2019 CLINICAL DATA:  Provided history: Numbness left side ear, arm, leg, feeling off balance. EXAM: MRI HEAD WITHOUT CONTRAST TECHNIQUE: Multiplanar, multiecho pulse sequences of the brain and surrounding structures were obtained without intravenous contrast. COMPARISON:  Head CT 07/22/2019. FINDINGS: Brain: Cerebral volume is normal for age. There is a 6 mm acute infarct within the left corona radiata (series 12, image 83). There is an additional 4 mm vague focus of diffusion weighted hyperintensity within the subcortical left frontal lobe without definite ADC correlate, which may reflect a subacute infarct (series 12, image 87). 9 mm acute infarct within the paramedian right pons (series 12, image 65). Punctate acute infarct within the superior cerebellum just to the right of midline (series 12, image 68). 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 Small to moderate-sized chronic infarct within the right cerebellum. Background mild patchy T2/FLAIR hyperintensity within the cerebral white matter is nonspecific, but consistent with chronic small vessel ischemic disease. No evidence of intracranial mass. No chronic intracranial blood products. No extra-axial fluid collection. No midline shift. Vascular: Expected proximal arterial flow voids. Skull and upper cervical spine: No focal marrow lesion. Sinuses/Orbits: Visualized orbits show no acute finding. Mild paranasal sinus mucosal thickening, most notably ethmoid. No significant mastoid effusion. IMPRESSION: 1. 6 mm acute infarct within the left corona radiata. 2. 9  mm acute infarct within the paramedian right pons. 3. Punctate acute infarct within the superior cerebellum just right of midline. 4. A 4 mm subacute infarct is questioned within the subcortical left frontal lobe. 5. Background mild chronic small vessel ischemic changes within the cerebral white matter. 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 A small to moderate-sized chronic infarct is also present within the right cerebellum. 6. Mild paranasal sinus mucosal thickening. Electronically Signed   By: Jackey LogeKyle  Golden DO   On: 07/22/2019 17:32    Procedures .Critical Care Performed by: Liberty HandyGibbons, Toluwanimi Radebaugh J, PA-C Authorized by: Liberty HandyGibbons, Jonet Mathies J, PA-C   Critical care provider statement:    Critical care time (minutes):  45   Critical care was necessary to treat or prevent imminent or life-threatening deterioration of the following conditions:  CNS failure or compromise   Critical care was time spent personally by me on the following activities:  Discussions with consultants, evaluation of patient's response to treatment, examination of patient, ordering and performing treatments and interventions, ordering and review of laboratory studies, ordering and review of radiographic studies, pulse oximetry, re-evaluation of patient's condition, obtaining history from patient or surrogate and review of old charts   I assumed direction of critical care for this patient from another provider in my specialty: no     (including critical care time)  Medications Ordered in ED Medications  sodium chloride flush (NS) 0.9 % injection 3 mL (has no administration in time range)    ED Course  I have reviewed the triage vital signs and the nursing notes.  Pertinent labs & imaging results that were available during my care of the patient were reviewed by me and considered in my medical decision making (see chart for details).  Clinical Course as of Jul 21 1808  Wed Jul 22, 2019   1736 IMPRESSION: 1. 6 mm acute infarct  within the left corona radiata. 2. 9 mm acute infarct within the paramedian right pons. 3. Punctate acute infarct within the superior cerebellum just right of midline. 4. A 4 mm subacute infarct is questioned within the subcortical left frontal lobe. 5. Background mild chronic small vessel ischemic changes within the cerebral white matter. 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 A small to moderate-sized chronic infarct is also present within the right cerebellum. 6. Mild paranasal sinus mucosal thickening.  MR BRAIN WO CONTRAST [CG]  1804 Dr Wilford Corner has reviewed MRI agrees with admission    [CG]    Clinical Course User Index [CG] Liberty Handy, PA-C   MDM Rules/Calculators/A&P                          I obtained additional history from triage, nursing notes and review of medical chart.  Previous medical records available, nursing notes reviewed to obtain more history and assist with MDM  This patient complains of left sided subjective numbness, paresthesias, feeling off balance. Onset 10 am.  Had a headache and nausea. Non compliant with BP meds. Smoker.   Chief complain involves an extensive number of treatment options and is a complaint that carries with it a high risk of complications and morbidity and mortality.    The differential diagnosis includes TIA, CVA, ICH, electrolyte abnormality.   Laboratory studies and imaging ordered in triage by triage RN.  I have personally visualized and interpreted these.    Labs show hyperglycemia without diabetic crisis.  Otherwise labs normal.  EKG with non specific TW changes from previous. Patient has no CP.  CT head non acute.    I have ordered MRI brain.   1745: MRI shows multiple acute infarcts in left corona radiata, right pons, superior cerebellum, and possible left frontal lobe, chronic ischemic vessel disease and multiple chronic infarcts.  Patient  hypertensive SBP 160-170s.  Will consult neurology and medicine team.     1807: MRI reviewed with Dr Wilford Corner, will add patient to list to be seen likely by night team.  Agrees with admission.  Patient re-evaluated and no clinical decline, updated on MRI findings and plan for admission. Shared with EDP.  Final Clinical Impression(s) / ED Diagnoses Final diagnoses:  Cerebrovascular accident (CVA), unspecified mechanism [facility])    Rx / DC Orders ED Discharge Orders    None       Liberty Handy, PA-C 07/22/19 1807    Liberty Handy, PA-C 07/22/19 1810    Gerhard Munch, MD 07/22/19 2352

## 2019-07-22 NOTE — ED Notes (Signed)
Neurology at bedside.

## 2019-07-22 NOTE — H&P (Addendum)
History and Physical    Larry Houston UUV:[id number] DOB: [birth year] DOA: 07/22/2019  PCP: Clovis Riley, L.August Saucer, MD  Patient coming from: Home  I have personally briefly reviewed patient's old medical records in [facility]  Chief Complaint: Home  HPI: Larry Houston is a 64 y.o. male with medical history significant for hypertension, chronic back pain, and alcohol use who presents with concerns of left-sided numbness and weakness.  Patient reports that yesterday around 9:14 AM he was cleaning the house when he suddenly felt his left fingers were going numb and this numbness radiated up her arm.  Also began to feel left lower extremity numbness and unsteadiness with ambulation.  States currently symptoms are improved now but was constant since yesterday.  Also had some nausea yesterday but no vomiting.  He also notes frontal achy headache this morning.  No blurred vision.  Also noted a brief left-sided chest pain for about 15 minutes while waiting in ED triage.  Reports palpitation in the past about a year ago but has not felt it since.  Patient has diagnosis of hypertension but stopped taking his antihypertensives on his own about a year ago.  Has not seen his PCP since spring of last year.  He endorsed tobacco use of 1 pack a day for the past 30 years.  Also has alcohol use of 2 to 3 glasses of liquor daily.  Last drink was yesterday.  Denies illicit drugs.  Glucose of 150.   ED Course: He was afebrile, hypertensive up to systolic of 190s on room air.  CBC only notable for chronic thrombocytopenia with platelet of 110.  MRI brain shows multiple acute infarcts.  Review of Systems:  Constitutional: No Weight Change, No Fever ENT/Mouth: No sore throat, No Rhinorrhea Eyes: No Eye Pain, No Vision Changes Cardiovascular: No Chest Pain, no SOB,+Palpitations Respiratory: No Cough, No Sputum  Gastrointestinal: No Nausea, No Vomiting, No Diarrhea, No Constipation, No Pain Genitourinary:  no Urinary Incontinence, No Urgency, No Flank Pain Musculoskeletal: No Arthralgias, No Myalgias Skin: No Skin Lesions, No Pruritus, Neuro: + Weakness, + Numbness,  No Loss of Consciousness, No Syncope Psych: No Anxiety/Panic, No Depression, no decrease appetite Heme/Lymph: No Bruising, No Bleeding  Past Medical History:  Diagnosis Date  . Alcohol abuse   . Arthritis    lumbar stenosis   . Hypertension   . Venous malformation 09/03/2016   Right lower extremity venous malformation    Past Surgical History:  Procedure Laterality Date  . IR RADIOLOGIST EVAL & MGMT  04/05/2016  . IR RADIOLOGIST EVAL & MGMT  10/17/2016  . IR RADIOLOGIST EVAL & MGMT  12/05/2016  . IR SCLEROTHERAPY OF A FLUID COLLECTION Right 09/13/2016   Venous malformation [Q27.9]   RLE  . IR SCLEROTHERAPY OF A FLUID COLLECTION  09/13/2016  . IR US GUIDE VASC ACCESS RIGHT  09/13/2016  . LUMBAR LAMINECTOMY/DECOMPRESSION MICRODISCECTOMY Bilateral 01/26/2015   Procedure: Laminectomy and Foraminotomy - L4-L5 - bilateral;  Surgeon: Tia Alert, MD;  Location: [facility];  Service: Neurosurgery;  Laterality: Bilateral;  Laminectomy and Foraminotomy - L4-L5 - bilateral  . NASAL ENDOSCOPY WITH EPISTAXIS CONTROL N/A 04/04/2014   Procedure: NASAL ENDOSCOPY WITH EPISTAXIS CONTROL;  Surgeon: Melvenia Beam, MD;  Location: [facility] OR;  Service: ENT;  Laterality: N/A;  . NECK SURGERY  1992   posterior fusion- cervical   . RADIOLOGY WITH ANESTHESIA N/A 09/13/2016   Procedure: percutaneous sclerotherapy with dehydrated ethanol;  Surgeon: Malachy Moan, MD;  Location:  [facility] OR;  Service: Radiology;  Laterality: N/A;     reports that he quit smoking about 13 years ago. He quit after 7.00 years of use. He has never used smokeless tobacco. He reports current alcohol use. He reports that he does not use drugs.  Allergies  Allergen Reactions  . Penicillins Hives    Has patient had a PCN reaction causing immediate rash, facial/tongue/throat  swelling, SOB or lightheadedness with hypotension: No Has patient had a PCN reaction causing severe rash involving mucus membranes or skin necrosis: No Has patient had a PCN reaction that required hospitalization No Has patient had a PCN reaction occurring within the last 10 years: No If all of the above answers are "NO", then may proceed with Cephalosporin use.    Family History  Problem Relation Age of Onset  . Diabetes Mother   . Hypertension Mother   . Anemia Mother        Died of aplastic anemia  . Lung disease Father   . Heart attack Brother   . Heart disease Sister        One sister had heart arrest     Prior to Admission medications   Medication Sig Start Date End Date Taking? Authorizing Provider  aspirin-acetaminophen-caffeine (EXCEDRIN EXTRA STRENGTH) [phone] MG tablet Take 1 tablet by mouth every 6 (six) hours as needed (for pain).   Yes [provider]  Aspirin-Caffeine (BAYER BACK & BODY PAIN EX ST) 500-32.5 MG TABS Take 2 tablets by mouth every 6 (six) hours as needed (for pain).   Yes [provider]  desonide (DESOWEN) 0.05 % ointment Apply 1 application topically 2 (two) times daily as needed (to groin).  04/29/19  Yes [provider]  diphenhydramine-acetaminophen (TYLENOL PM) 25-500 MG TABS tablet Take 1 tablet by mouth at bedtime as needed (for sleep).   Yes [provider]  etodolac (LODINE) 400 MG tablet Take 400 mg by mouth daily as needed (for pain).   Yes [provider]  gabapentin (NEURONTIN) 300 MG capsule Take 1 capsule (300 mg total) by mouth 3 (three) times daily. Patient taking differently: Take 300 mg by mouth daily as needed (for neuropathy).  04/05/16  Yes Tyrell AntonioNewton, Frederic, MD  ibuprofen (ADVIL,MOTRIN) 200 MG tablet Take 400 mg by mouth every 6 (six) hours as needed for headache or mild pain.    Yes [provider]  amLODipine (NORVASC) 5 MG tablet Take 5 mg by mouth daily before breakfast.   Patient not taking: Reported on 07/22/2019    [provider]  doxycycline (VIBRAMYCIN) 100 MG capsule Take 1 capsule (100 mg total) by mouth 2 (two) times daily. Patient not taking: Reported on 07/22/2019 02/20/15   Leatha GildingGherghe, Costin M, MD  econazole nitrate 1 % cream Apply 1 application topically daily as needed (affected area).  Patient not taking: Reported on 07/22/2019 12/27/14   [provider]  sildenafil (VIAGRA) 100 MG tablet Take 1-2 tablets by mouth daily as needed. Patient not taking: Reported on 07/22/2019 09/05/16   [provider]    Physical Exam: Vitals:   07/22/19 1338 07/22/19 1517 07/22/19 1745 07/22/19 1830  BP: (!) 161/96 (!) 162/84 (!) 169/108 (!) 181/102  Pulse: 85 75 79 73  Resp: 18 15    Temp:      TempSrc:      SpO2: 100% 97% 98% 99%  Weight:      Height:        Constitutional: NAD, calm, comfortable, gentleman  appearing younger than his stated age sitting in bed 40 degree incline. Vitals:   07/22/19 1338 07/22/19 1517 07/22/19 1745 07/22/19 1830  BP: (!) 161/96 (!) 162/84 (!) 169/108 (!) 181/102  Pulse: 85 75 79 73  Resp: 18 15    Temp:      TempSrc:      SpO2: 100% 97% 98% 99%  Weight:      Height:       Eyes: PERRL, lids and conjunctivae normal ENMT: Mucous membranes are moist. Neck: normal, supple Respiratory: clear to auscultation bilaterally, no wheezing, no crackles. Normal respiratory effort. No accessory muscle use.  Cardiovascular: Regular rate and rhythm, no murmurs / rubs / gallops. No extremity edema. 2+ pedal pulses. No carotid bruits.  Abdomen: no tenderness, no masses palpated. Bowel sounds positive.  Musculoskeletal: no clubbing / cyanosis. No joint deformity upper and lower extremities. Good ROM, no contractures. Normal muscle tone.  Skin: no rashes, lesions, ulcers. No induration Neurologic: CN 2-12 grossly intact. Sensation intact, DTR normal. Strength 5/5 in all 4.  Decreased sensation of the left lower  extremity compared to the right.  Intact finger-nose.  Intact heel-to-shin. Psychiatric: Normal judgment and insight. Alert and oriented x 3. Normal mood.     Labs on Admission: I have personally reviewed following labs and imaging studies  CBC: Recent Labs  Lab 07/22/19 1131 07/22/19 1200  WBC 5.2  --   NEUTROABS 3.3  --   HGB 16.7 17.0  HCT 49.3 50.0  MCV 97.4  --   PLT 110*  --    Basic Metabolic Panel: Recent Labs  Lab 07/22/19 1131 07/22/19 1200  NA 140 144  K 3.7 3.6  CL 104 103  CO2 26  --   GLUCOSE 152* 150*  BUN 9 11  CREATININE 0.85 0.80  CALCIUM 9.3  --    GFR: Estimated Creatinine Clearance: 108.2 mL/min (by C-G formula based on SCr of 0.8 mg/dL). Liver Function Tests: Recent Labs  Lab 07/22/19 1131  AST 39  ALT 26  ALKPHOS 56  BILITOT 0.9  PROT 7.5  ALBUMIN 3.9   No results for input(s): LIPASE, AMYLASE in the last 168 hours. No results for input(s): AMMONIA in the last 168 hours. Coagulation Profile: Recent Labs  Lab 07/22/19 1131  INR 1.0   Cardiac Enzymes: No results for input(s): CKTOTAL, CKMB, CKMBINDEX, TROPONINI in the last 168 hours. BNP (last 3 results) No results for input(s): PROBNP in the last 8760 hours. HbA1C: No results for input(s): HGBA1C in the last 72 hours. CBG: No results for input(s): GLUCAP in the last 168 hours. Lipid Profile: No results for input(s): CHOL, HDL, LDLCALC, TRIG, CHOLHDL, LDLDIRECT in the last 72 hours. Thyroid Function Tests: No results for input(s): TSH, T4TOTAL, FREET4, T3FREE, THYROIDAB in the last 72 hours. Anemia Panel: No results for input(s): VITAMINB12, FOLATE, FERRITIN, TIBC, IRON, RETICCTPCT in the last 72 hours. Urine analysis:    Component Value Date/Time   COLORURINE YELLOW 02/18/2015 1600   APPEARANCEUR CLEAR 02/18/2015 1600   LABSPEC 1.016 02/18/2015 1600   PHURINE 6.0 02/18/2015 1600   GLUCOSEU NEGATIVE 02/18/2015 1600   HGBUR NEGATIVE 02/18/2015 1600   BILIRUBINUR NEGATIVE  02/18/2015 1600   KETONESUR NEGATIVE 02/18/2015 1600   PROTEINUR NEGATIVE 02/18/2015 1600   NITRITE NEGATIVE 02/18/2015 1600   LEUKOCYTESUR NEGATIVE 02/18/2015 1600    Radiological Exams on Admission: CT HEAD WO CONTRAST  Result Date: 07/22/2019 CLINICAL DATA:  Onset left side tingling yesterday at 10  a.m. which progressed to numbness this morning. EXAM: CT HEAD WITHOUT CONTRAST TECHNIQUE: Contiguous axial images were obtained from the base of the skull through the vertex without intravenous contrast. COMPARISON:  None. FINDINGS: Brain: No evidence of acute infarction, hemorrhage, hydrocephalus, extra-axial collection or mass lesion/mass effect. Vascular: No hyperdense vessel or unexpected calcification. Skull: Normal. Negative for fracture or focal lesion. Sinuses/Orbits: Status post right maxillary antrostomy. Tiny mucous retention cyst or polyp right sphenoid sinus noted. Other: None. IMPRESSION: No acute intracranial abnormality. Electronically Signed   By: Drusilla Kanner M.D.   On: 07/22/2019 13:24   MR BRAIN WO CONTRAST  Result Date: 07/22/2019 CLINICAL DATA:  Provided history: Numbness left side ear, arm, leg, feeling off balance. EXAM: MRI HEAD WITHOUT CONTRAST TECHNIQUE: Multiplanar, multiecho pulse sequences of the brain and surrounding structures were obtained without intravenous contrast. COMPARISON:  Head CT 07/22/2019. FINDINGS: Brain: Cerebral volume is normal for age. There is a 6 mm acute infarct within the left corona radiata (series 12, image 83). There is an additional 4 mm vague focus of diffusion weighted hyperintensity within the subcortical left frontal lobe without definite ADC correlate, which may reflect a subacute infarct (series 12, image 87). 9 mm acute infarct within the paramedian right pons (series 12, image 65). Punctate acute infarct within the superior cerebellum just to the right of midline (series 12, image 68). Chronic lacunar infarcts within the anterior corpus  callosum and right thalamus. Small to moderate-sized chronic infarct within the right cerebellum. Background mild patchy T2/FLAIR hyperintensity within the cerebral white matter is nonspecific, but consistent with chronic small vessel ischemic disease. No evidence of intracranial mass. No chronic intracranial blood products. No extra-axial fluid collection. No midline shift. Vascular: Expected proximal arterial flow voids. Skull and upper cervical spine: No focal marrow lesion. Sinuses/Orbits: Visualized orbits show no acute finding. Mild paranasal sinus mucosal thickening, most notably ethmoid. No significant mastoid effusion. IMPRESSION: 1. 6 mm acute infarct within the left corona radiata. 2. 9 mm acute infarct within the paramedian right pons. 3. Punctate acute infarct within the superior cerebellum just right of midline. 4. A 4 mm subacute infarct is questioned within the subcortical left frontal lobe. 5. Background mild chronic small vessel ischemic changes within the cerebral white matter. 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 A small to moderate-sized chronic infarct is also present within the right cerebellum. 6. Mild paranasal sinus mucosal thickening. Electronically Signed   By: Jackey Loge DO   On: 07/22/2019 17:32      Assessment/Plan   Acute infarcts MRI shows multiple acute infarcts in left corona radiata, right pons, superior cerebellum, and possible left frontal lobe, chronic ischemic vessel disease and multiple chronic infarcts CTA neck and CTA head obtained showed occluded right vertebral artery with suspicion for right vertebral artery.  Case discussed with Dr.Todd Early of vascular surgery over the phone who reviewed imaging and suspect complete occlusion of right vertebral and no intervention or anticoagulation is needed for stenosis.  Await neurology recommendations for anticogulation recommendations. echocardiogram  start daily aspirin  and atorvastatin Obtain A1c and lipids PT/OT/SLT Frequent neuro checks and keep on telemetry  Right vertebral artery complete occlusion No intervention or anticoagulation per curbside consult with Dr. Arbie Cookey of vascular surgery who reviewed his imaging  Hypertension Goal of 140-180 per neuro.  PRN labetalol overnight and need to start anti-hypertensive in the morning  Chronic alcohol use Patient endorsed about 2-3 drinks of liquor nightly with last drink  being last night on 7/13 Place on CIWA protocol  Chronic thrombocytopenia  Suspect secondary to chronic alcohol use  DVT prophylaxis:.Lovenox Code Status: Full Family Communication: Plan discussed with patient at bedside  disposition Plan: Home with at least 2 midnight stays  Consults called:  Admission status: inpatient  Status is: Inpatient  Remains inpatient appropriate because:Inpatient level of care appropriate due to severity of illness   Dispo: The patient is from: Home              Anticipated d/c is to: Home              Anticipated d/c date is: 3 days              Patient currently is not medically stable to d/c.         Anselm Jungling DO [facility]   If 7PM-7AM, please contact night-coverage [email or web address]   07/22/2019, 8:12 PM

## 2019-07-22 NOTE — ED Notes (Signed)
The pt went to mri   approx 20 minutes ago  I have not had a chance to assess him

## 2019-07-22 NOTE — ED Notes (Signed)
Patient transported to CT 

## 2019-07-23 ENCOUNTER — Inpatient Hospital Stay (HOSPITAL_BASED_OUTPATIENT_CLINIC_OR_DEPARTMENT_OTHER): Payer: Self-pay

## 2019-07-23 DIAGNOSIS — I6389 Other cerebral infarction: Secondary | ICD-10-CM

## 2019-07-23 DIAGNOSIS — I639 Cerebral infarction, unspecified: Secondary | ICD-10-CM | POA: Diagnosis present

## 2019-07-23 LAB — ECHOCARDIOGRAM COMPLETE
Area-P 1/2: 2.17 cm2
Calc EF: 39.1 %
Height: 73 in
S' Lateral: 4.7 cm
Single Plane A2C EF: 37.9 %
Single Plane A4C EF: 37.9 %
Weight: 3280 oz

## 2019-07-23 LAB — LIPID PANEL
Cholesterol: 135 mg/dL (ref 0–200)
HDL: 46 mg/dL (ref >40)
LDL Cholesterol: 55 mg/dL (ref 0–99)
Total CHOL/HDL Ratio: 2.9 RATIO
Triglycerides: 170 mg/dL — ABNORMAL HIGH (ref <150)
VLDL: 34 mg/dL (ref 0–40)

## 2019-07-23 LAB — HEMOGLOBIN A1C
Hgb A1c MFr Bld: 5.8 % — ABNORMAL HIGH (ref 4.8–5.6)
Mean Plasma Glucose: 119.76 mg/dL

## 2019-07-23 LAB — RAPID URINE DRUG SCREEN, HOSP PERFORMED
Amphetamines: NOT DETECTED
Barbiturates: NOT DETECTED
Benzodiazepines: NOT DETECTED
Cocaine: POSITIVE — AB
Opiates: NOT DETECTED
Tetrahydrocannabinol: NOT DETECTED

## 2019-07-23 LAB — SARS CORONAVIRUS 2 BY RT PCR (HOSPITAL ORDER, PERFORMED IN ~~LOC~~ HOSPITAL LAB): SARS Coronavirus 2: NEGATIVE

## 2019-07-23 MED ORDER — ASPIRIN EC 325 MG PO TBEC
325.0000 mg | DELAYED_RELEASE_TABLET | Freq: Every day | ORAL | Status: DC
  Administered 2019-07-23: 325 mg via ORAL
  Filled 2019-07-23: qty 1

## 2019-07-23 MED ORDER — ASPIRIN EC 81 MG PO TBEC
81.0000 mg | DELAYED_RELEASE_TABLET | Freq: Every day | ORAL | Status: DC
  Administered 2019-07-23: 81 mg via ORAL
  Filled 2019-07-23: qty 1

## 2019-07-23 MED ORDER — ASPIRIN 81 MG PO TBEC: 81.0000 mg | DELAYED_RELEASE_TABLET | Freq: Every day | ORAL | 11 refills | Status: DC

## 2019-07-23 MED ORDER — AMLODIPINE BESYLATE 5 MG PO TABS: 5.0000 mg | ORAL_TABLET | Freq: Every day | ORAL | 1 refills | Status: DC

## 2019-07-23 MED ORDER — ATORVASTATIN CALCIUM 80 MG PO TABS: 80.0000 mg | ORAL_TABLET | Freq: Every day | ORAL | 0 refills | Status: DC

## 2019-07-23 MED ORDER — LABETALOL HCL 5 MG/ML IV SOLN: 5.0000 mg | INTRAVENOUS | Status: DC | PRN

## 2019-07-23 MED ORDER — CLOPIDOGREL BISULFATE 75 MG PO TABS: 75.0000 mg | ORAL_TABLET | Freq: Every day | ORAL | 0 refills | Status: DC

## 2019-07-23 MED ORDER — CLOPIDOGREL BISULFATE 75 MG PO TABS
75.0000 mg | ORAL_TABLET | Freq: Every day | ORAL | Status: DC
  Administered 2019-07-23: 75 mg via ORAL
  Filled 2019-07-23: qty 1

## 2019-07-23 MED FILL — ATORVASTATIN 80 MG TABLET: 80 | 30 days supply | Qty: 30 | Fill #0

## 2019-07-23 MED FILL — AMLODIPINE BESYLATE 5 MG TA: 5 | 30 days supply | Qty: 30 | Fill #0

## 2019-07-23 MED FILL — CLOPIDOGREL 75 MG TABLET: 75 | 21 days supply | Qty: 21 | Fill #0

## 2019-07-23 NOTE — ED Notes (Signed)
Lunch Tray Ordered @ 1120. 

## 2019-07-23 NOTE — Discharge Summary (Signed)
DISCHARGE SUMMARY  Larry Houston  MR#: [id number]  DOB:Oct 15, 1955  Date of Admission: 07/22/2019 Date of Discharge: 07/23/2019  Attending Physician:Karrina Lye Silvestre Gunner, MD  Patient's HEN:IDPOEUMP, L.August Saucer, MD  Consults: Stroke Team  Disposition: D/C home    Follow-up Appts:  Follow-up Information    Clovis Riley, L.August Saucer, MD Follow up in 1 week(s).   Specialty: Family Medicine Contact information: [address] [place] [address] [phone]               Tests Needing Follow-up: -f/u on results of TTE pending at time of d/c  -f/u BP control - titrate meds as indicated  -assess tolerance of lipitor dosing   Discharge Diagnoses: Multifocal acute ischemic CVAs Possible right vertebral artery dissection /occlusion HTN Habitual alcohol use Tobacco abuse Chronic thrombocytopenia  Initial presentation: 64 year old with a history of HTN, tobacco abuse, chronic back pain due to lumbar stenosis status post surgery, and habitual alcohol use who presented to the ED with left-sided numbness and weakness.  In the ED he was found to have a systolic blood pressure of 190. MRI noted a wedge shaped right paramedian pontine infarct as well as punctate left corona radiata and right cerebellar acute infarcts.  Hospital Course:  Multifocal acute CVAs Stroke Team directed care during hospital stay with evaluation as follows:  Right pontine and small left CR/SO acute infarcts as well as right cerebellar vermis subacute small infarct, likely synchronized small vessel disease.   CT head No acute abnormality.   MRI  L corona radiata infarct. R paramedian pontine infarct. R superior cerebellar subacute infarct. ? L frontal lobe subacute infarct. Old corpus callosum, R thalamic, R cerebellar infarcts.   CTA head intracranial R VA + R PICA opacification, L VA small w/ flow. 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 Aortic  arch, proximal major vessels and L ICA bifurcation atherosclerosis.   2D Echo pending  LDL 55 after dose of lipitor  HgbA1c 5.8 No antithrombotic prior to admission, initially put on aspirin 325, now on aspirin 81 mg daily and clopidogrel 75 mg daily. Continue DAPT x 3 weeks then aspirin alone   Possible right vertebral artery dissection /occlusion Noted on CTA neck -discussed with vascular surgery by admitting doctor who advised no need for acute intervention or role for anticoagulation  HTN Practice permissive hypertension during hospital stay w/ plan to initiate medical tx after d/c home   Habitual alcohol use Reportedly drinks 2-3 drinks of liquor per day -no evidence of withdrawal during hospital stay  Tobacco abuse Counseled on the absolute need to discontinue tobacco abuse entirely and permanently  Chronic thrombocytopenia Likely due to alcohol abuse -monitor   Allergies as of 07/23/2019      Reactions   Penicillins Hives   Has patient had a PCN reaction causing immediate rash, facial/tongue/throat swelling, SOB or lightheadedness with hypotension: No Has patient had a PCN reaction causing severe rash involving mucus membranes or skin necrosis: No Has patient had a PCN reaction that required hospitalization No Has patient had a PCN reaction occurring within the last 10 years: No If all of the above answers are "NO", then may proceed with Cephalosporin use.      Medication List    STOP taking these medications   Bayer Back & Body Pain Ex St 500-32.5 MG Tabs Generic drug: Aspirin-Caffeine   diphenhydramine-acetaminophen 25-500 MG Tabs tablet Commonly known as: TYLENOL PM   doxycycline 100  MG capsule Commonly known as: VIBRAMYCIN   Excedrin Extra Strength 250-250-65 MG tablet Generic drug: aspirin-acetaminophen-caffeine   ibuprofen 200 MG tablet Commonly known as: ADVIL   sildenafil 100 MG tablet Commonly known as: VIAGRA     TAKE these medications    amLODipine 5 MG tablet Commonly known as: NORVASC Take 1 tablet (5 mg total) by mouth daily before breakfast.   aspirin 81 MG EC tablet Take 1 tablet (81 mg total) by mouth daily. Swallow whole. Start taking on: July 24, 2019   atorvastatin 80 MG tablet Commonly known as: LIPITOR Take 1 tablet (80 mg total) by mouth daily. Start taking on: July 24, 2019   clopidogrel 75 MG tablet Commonly known as: PLAVIX Take 1 tablet (75 mg total) by mouth daily. Start taking on: July 24, 2019   desonide 0.05 % ointment Commonly known as: DESOWEN Apply 1 application topically 2 (two) times daily as needed (to groin).   econazole nitrate 1 % cream Apply 1 application topically daily as needed (affected area).   etodolac 400 MG tablet Commonly known as: LODINE Take 400 mg by mouth daily as needed (for pain).   gabapentin 300 MG capsule Commonly known as: NEURONTIN Take 1 capsule (300 mg total) by mouth 3 (three) times daily. What changed:   when to take this  reasons to take this       Day of Discharge BP (!) 175/108 (BP Location: Right Arm)   Pulse 73   Temp 98.6 F (37 C) (Oral)   Resp 17   Ht 6\' 1"  (1.854 m)   Wt 93 kg   SpO2 100%   BMI 27.05 kg/m   Physical Exam: General: No acute respiratory distress Lungs: Clear to auscultation bilaterally without wheezes or crackles Cardiovascular: Regular rate and rhythm without murmur gallop or rub normal S1 and S2 Abdomen: Nontender, nondistended, soft, bowel sounds positive, no rebound, no ascites, no appreciable mass Extremities: No significant cyanosis, clubbing, or edema bilateral lower extremities  Basic Metabolic Panel: Recent Labs  Lab 07/22/19 1131 07/22/19 1200  NA 140 144  K 3.7 3.6  CL 104 103  CO2 26  --   GLUCOSE 152* 150*  BUN 9 11  CREATININE 0.85 0.80  CALCIUM 9.3  --     Liver Function Tests: Recent Labs  Lab 07/22/19 1131  AST 39  ALT 26  ALKPHOS 56  BILITOT 0.9  PROT 7.5  ALBUMIN 3.9    Coags: Recent Labs  Lab 07/22/19 1131  INR 1.0   CBC: Recent Labs  Lab 07/22/19 1131 07/22/19 1200  WBC 5.2  --   NEUTROABS 3.3  --   HGB 16.7 17.0  HCT 49.3 50.0  MCV 97.4  --   PLT 110*  --      Recent Results (from the past 240 hour(s))  SARS Coronavirus 2 by RT PCR (hospital order, performed in [facility] hospital lab) Nasopharyngeal Nasopharyngeal Swab     Status: None   Collection Time: 07/23/19  7:59 AM   Specimen: Nasopharyngeal Swab  Result Value Ref Range Status   SARS Coronavirus 2 NEGATIVE NEGATIVE Final    Comment: (NOTE) SARS-CoV-2 target nucleic acids are NOT DETECTED.  The SARS-CoV-2 RNA is generally detectable in upper and lower respiratory specimens during the acute phase of infection. The lowest concentration of SARS-CoV-2 viral copies this assay can detect is 250 copies / mL. A negative result does not preclude SARS-CoV-2 infection and should not be used as the  sole basis for treatment or other patient management decisions.  A negative result may occur with improper specimen collection / handling, submission of specimen other than nasopharyngeal swab, presence of viral mutation(s) within the areas targeted by this assay, and inadequate number of viral copies (<250 copies / mL). A negative result must be combined with clinical observations, patient history, and epidemiological information.  Fact Sheet for Patients:   BoilerBrush.com.cy  Fact Sheet for Healthcare Providers: https://pope.com/  This test is not yet approved or  cleared by the [place] FDA and has been authorized for detection and/or diagnosis of SARS-CoV-2 by FDA under an Emergency Use Authorization (EUA).  This EUA will remain in effect (meaning this test can be used) for the duration of the COVID-19 declaration under Section 564(b)(1) of the Act, 21 U.S.C. section 360bbb-3(b)(1), unless the authorization is terminated  or revoked sooner.  Performed at [facility], [address], [place], [place] [address]       Time spent in discharge (includes decision making & examination of pt): 35 minutes  07/23/2019, 5:40 PM   Lonia Blood, MD [facility] Office  [phone]

## 2019-07-23 NOTE — Progress Notes (Signed)
   07/23/19   To Whom it may concern,  Larry Houston was admitted to [facility] on 07/22/2019 and remained under my care in the hospital through 07/23/19.  He has been advised that he should not return to work until 07/27/19, at which time he will be cleared to resume all of his usual responsibilities.  Sincerely,  Lonia Blood, MD [facility] Office  [phone]

## 2019-07-23 NOTE — ED Notes (Signed)
Patient verbalizes understanding of discharge instructions. Opportunity for questioning and answers were provided. Armband removed by staff, pt discharged from ED and ambulated to lobby to return home with family.  

## 2019-07-23 NOTE — Progress Notes (Signed)
  Echocardiogram 2D Echocardiogram has been performed.  Larry Houston 07/23/2019, 11:56 AM

## 2019-07-23 NOTE — ED Notes (Signed)
Breakfast Ordered--[facility]  

## 2019-07-23 NOTE — Progress Notes (Signed)
STROKE TEAM PROGRESS NOTE   INTERVAL HISTORY Pt sitting in bed for breakfast. He stated that his symptoms largely resolve, but still has some tingling at left hand and bottom of the left foot. He denies illicit drug use, but admitted smoker and drinking alcohol. His BP was high here, and he admitted that he has been out of his BP meds for a long time. He is educated on smoke cessation, alcohol limitation and medication compliance.   Vitals:   07/23/19 0300 07/23/19 0315 07/23/19 0538 07/23/19 0927  BP: (!) 170/97 (!) 164/81 (!) 155/100 (!) 175/108  Pulse: 70 68 83 73  Resp: 17  18 17   Temp:      TempSrc:      SpO2: 97% 100% 97% 100%  Weight:      Height:       CBC:  Recent Labs  Lab 07/22/19 1131 07/22/19 1200  WBC 5.2  --   NEUTROABS 3.3  --   HGB 16.7 17.0  HCT 49.3 50.0  MCV 97.4  --   PLT 110*  --    Basic Metabolic Panel:  Recent Labs  Lab 07/22/19 1131 07/22/19 1200  NA 140 144  K 3.7 3.6  CL 104 103  CO2 26  --   GLUCOSE 152* 150*  BUN 9 11  CREATININE 0.85 0.80  CALCIUM 9.3  --    Lipid Panel:  Recent Labs  Lab 07/23/19 0524  CHOL 135  TRIG 170*  HDL 46  CHOLHDL 2.9  VLDL 34  LDLCALC 55   HgbA1c:  Recent Labs  Lab 07/23/19 0524  HGBA1C 5.8*   Urine Drug Screen: No results for input(s): LABOPIA, COCAINSCRNUR, LABBENZ, AMPHETMU, THCU, LABBARB in the last 168 hours.  Alcohol Level No results for input(s): ETH in the last 168 hours.  IMAGING past 24 hours CT ANGIO HEAD W OR WO CONTRAST  Addendum Date: 07/22/2019   ADDENDUM REPORT: 07/22/2019 21:33 ADDENDUM: These results were called by telephone at the time of interpretation on 07/22/2019 at 9:32 pm to provider CHING TU , who verbally acknowledged these results. Electronically Signed   By: Jackey LogeKyle  Golden DO   On: 07/22/2019 21:33   Result Date: 07/22/2019 CLINICAL DATA:  Provided history: Stroke/TIA, determine embolic source. EXAM: CT ANGIOGRAPHY HEAD AND NECK TECHNIQUE: Multidetector CT imaging of  the head and neck was performed using the standard protocol during bolus administration of intravenous contrast. Multiplanar CT image reconstructions and MIPs were obtained to evaluate the vascular anatomy. Carotid stenosis measurements (when applicable) are obtained utilizing NASCET criteria, using the distal internal carotid diameter as the denominator. CONTRAST:  50mL OMNIPAQUE IOHEXOL 350 MG/ML SOLN COMPARISON:  Brain MRI performed earlier the same day 07/22/2019 FINDINGS: CTA NECK FINDINGS Aortic arch: Standard aortic branching. Minimal atherosclerotic plaque within the visualized aortic arch. Minimal atherosclerotic plaque within the visualized aortic arch and proximal major branch vessels of the neck. No hemodynamically significant innominate or proximal subclavian artery stenosis. Right carotid system: CCA and ICA patent within the neck without stenosis. No significant atherosclerotic disease. Left carotid system: CCA and ICA patent within the neck without stenosis. Minimal calcified plaque within the carotid bifurcation. 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 The left vertebral artery is developmentally diminutive, but patent throughout the neck. Skeleton: No acute bony abnormality or aggressive osseous lesion. Cervical spondylosis with multilevel disc space narrowing, disc bulges, uncovertebral and facet hypertrophy. There also fairly  prominent ventral osteophytes at C4-C5, C5-C6 and C6-C7. Other neck: No neck mass or cervical lymphadenopathy. Thyroid unremarkable. Upper chest: No consolidation within the imaged lung apices. Review of the MIP images confirms the above findings CTA HEAD FINDINGS Anterior circulation: The intracranial internal carotid arteries are patent. The M1 middle cerebral arteries are patent without significant stenosis. No M2 proximal branch occlusion or high-grade proximal  stenosis is identified. The anterior cerebral arteries are patent. No intracranial aneurysm is identified. Posterior circulation: There is opacification of the intracranial right vertebral artery and right PICA, which may be due to retrograde flow. The non dominant left vertebral artery is developmentally diminutive, but patent. Distal to the left PICA, the left vertebral artery is small and irregular, which may be developmental, although a high-grade stenosis within this vessel is difficult to exclude. The basilar artery is developmentally diminutive, but patent. There are sizable bilateral posterior communicating arteries. The posterior communicating arteries are patent without significant proximal stenosis. Venous sinuses: Within limitations of contrast timing, no convincing thrombus. Anatomic variants: As described Review of the MIP images confirms the above findings IMPRESSION: CTA neck: 1. 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 2. The bilateral common carotid, internal carotid and left vertebral arteries are patent within the neck without stenosis 3. Mild atherosclerotic plaque within the visualized aortic arch, proximal major branch vessels of the neck and left carotid bifurcation. CTA head: 1. Opacification of the intracranial right vertebral artery and right PICA, which may be due to retrograde flow. 2. The non dominant intracranial left vertebral artery is developmentally diminutive, but patent. This vessel is small and irregular beyond the origin of the left PICA and this may be developmental. A significant stenosis at this site cannot be excluded. 3. Elsewhere, there is no intracranial large vessel occlusion or proximal high-grade arterial stenosis. Electronically Signed: By:Jackey Logelden DO On: 07/22/2019 21:17   CT HEAD WO  CONTRAST  Result Date: 07/22/2019 CLINICAL DATA:  Onset left side tingling yesterday at 10 a.m. which progressed to numbness this morning. EXAM: CT HEAD WITHOUT CONTRAST TECHNIQUE: Contiguous axial images were obtained from the base of the skull through the vertex without intravenous contrast. COMPARISON:  None. FINDINGS: Brain: No evidence of acute infarction, hemorrhage, hydrocephalus, extra-axial collection or mass lesion/mass effect. Vascular: No hyperdense vessel or unexpected calcification. Skull: Normal. Negative for fracture or focal lesion. Sinuses/Orbits: Status post right maxillary antrostomy. Tiny mucous retention cyst or polyp right sphenoid sinus noted. Other: None. IMPRESSION: No acute intracranial abnormality. Electronically Signed   By: Drusilla Kanner M.D.   On: 07/22/2019 13:24   CT ANGIO NECK W OR WO CONTRAST  Addendum Date: 07/22/2019   ADDENDUM REPORT: 07/22/2019 21:33 ADDENDUM: These results were called by telephone at the time of interpretation on 07/22/2019 at 9:32 pm to provider CHING TU , who verbally acknowledged these results. Electronically Signed   By: Jackey Loge DO   On: 07/22/2019 21:33   Result Date: 07/22/2019 CLINICAL DATA:  Provided history: Stroke/TIA, determine embolic source. EXAM: CT ANGIOGRAPHY HEAD AND NECK TECHNIQUE: Multidetector CT imaging of the head and neck was performed using the standard protocol during bolus administration of intravenous contrast. Multiplanar CT image reconstructions and MIPs were obtained to evaluate the vascular anatomy. Carotid stenosis measurements (when applicable) are obtained utilizing NASCET criteria, using the distal internal carotid diameter as the denominator. CONTRAST:  50mL OMNIPAQUE IOHEXOL  350 MG/ML SOLN COMPARISON:  Brain MRI performed earlier the same day 07/22/2019 FINDINGS: CTA NECK FINDINGS Aortic arch: Standard aortic branching. Minimal atherosclerotic plaque within the visualized aortic arch. Minimal atherosclerotic  plaque within the visualized aortic arch and proximal major branch vessels of the neck. No hemodynamically significant innominate or proximal subclavian artery stenosis. Right carotid system: CCA and ICA patent within the neck without stenosis. No significant atherosclerotic disease. Left carotid system: CCA and ICA patent within the neck without stenosis. Minimal calcified plaque within the carotid bifurcation. 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 The left vertebral artery is developmentally diminutive, but patent throughout the neck. Skeleton: No acute bony abnormality or aggressive osseous lesion. Cervical spondylosis with multilevel disc space narrowing, disc bulges, uncovertebral and facet hypertrophy. There also fairly prominent ventral osteophytes at C4-C5, C5-C6 and C6-C7. Other neck: No neck mass or cervical lymphadenopathy. Thyroid unremarkable. Upper chest: No consolidation within the imaged lung apices. Review of the MIP images confirms the above findings CTA HEAD FINDINGS Anterior circulation: The intracranial internal carotid arteries are patent. The M1 middle cerebral arteries are patent without significant stenosis. No M2 proximal branch occlusion or high-grade proximal stenosis is identified. The anterior cerebral arteries are patent. No intracranial aneurysm is identified. Posterior circulation: There is opacification of the intracranial right vertebral artery and right PICA, which may be due to retrograde flow. The non dominant left vertebral artery is developmentally diminutive, but patent. Distal to the left PICA, the left vertebral artery is small and irregular, which may be developmental, although a high-grade stenosis within this vessel is difficult to exclude. The basilar artery is developmentally diminutive, but patent. There are sizable bilateral posterior communicating arteries.  The posterior communicating arteries are patent without significant proximal stenosis. Venous sinuses: Within limitations of contrast timing, no convincing thrombus. Anatomic variants: As described Review of the MIP images confirms the above findings IMPRESSION: CTA neck: 1. 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 2. The bilateral common carotid, internal carotid and left vertebral arteries are patent within the neck without stenosis 3. Mild atherosclerotic plaque within the visualized aortic arch, proximal major branch vessels of the neck and left carotid bifurcation. CTA head: 1. Opacification of the intracranial right vertebral artery and right PICA, which may be due to retrograde flow. 2. The non dominant intracranial left vertebral artery is developmentally diminutive, but patent. This vessel is small and irregular beyond the origin of the left PICA and this may be developmental. A significant stenosis at this site cannot be excluded. 3. Elsewhere, there is no intracranial large vessel occlusion or proximal high-grade arterial stenosis. Electronically Signed: By: Jackey Loge DO On: 07/22/2019 21:17   MR BRAIN WO CONTRAST  Result Date: 07/22/2019 CLINICAL DATA:  Provided history: Numbness left side ear, arm, leg, feeling off balance. EXAM: MRI HEAD WITHOUT CONTRAST TECHNIQUE: Multiplanar, multiecho pulse sequences of the brain and surrounding structures were obtained without intravenous contrast. COMPARISON:  Head CT 07/22/2019. FINDINGS: Brain: Cerebral volume is normal for age. There is a 6 mm acute infarct within the left corona radiata (series 12, image 83). There is an additional 4 mm vague focus of diffusion weighted hyperintensity within the subcortical left frontal lobe without definite ADC correlate, which may reflect a subacute infarct  (series 12, image 87). 9 mm acute infarct within  the paramedian right pons (series 12, image 65). Punctate acute infarct within the superior cerebellum just to the right of midline (series 12, image 68). 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 Small to moderate-sized chronic infarct within the right cerebellum. Background mild patchy T2/FLAIR hyperintensity within the cerebral white matter is nonspecific, but consistent with chronic small vessel ischemic disease. No evidence of intracranial mass. No chronic intracranial blood products. No extra-axial fluid collection. No midline shift. Vascular: Expected proximal arterial flow voids. Skull and upper cervical spine: No focal marrow lesion. Sinuses/Orbits: Visualized orbits show no acute finding. Mild paranasal sinus mucosal thickening, most notably ethmoid. No significant mastoid effusion. IMPRESSION: 1. 6 mm acute infarct within the left corona radiata. 2. 9 mm acute infarct within the paramedian right pons. 3. Punctate acute infarct within the superior cerebellum just right of midline. 4. A 4 mm subacute infarct is questioned within the subcortical left frontal lobe. 5. Background mild chronic small vessel ischemic changes within the cerebral white matter. 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 A small to moderate-sized chronic infarct is also present within the right cerebellum. 6. Mild paranasal sinus mucosal thickening. Electronically Signed   By: Jackey Loge DO   On: 07/22/2019 17:32    PHYSICAL EXAM  Pulse Rate:  [67-87] 73 (07/15 0927) Resp:  [15-23] 17 (07/15 0927) BP: (155-197)/(81-122) 175/108 (07/15 0927) SpO2:  [97 %-100 %] 100 % (07/15 0927)  General - Well nourished, well developed, in no apparent distress.  Ophthalmologic - fundi not visualized due to noncooperation.  Cardiovascular - Regular rhythm and rate.  Mental Status -  Level of arousal and  orientation to time, place, and person were intact. Language including expression, naming, repetition, comprehension was assessed and found intact. Fund of Knowledge was assessed and was intact.  Cranial Nerves II - XII - II - Visual field intact OU. III, IV, VI - Extraocular movements intact. V - Facial sensation intact bilaterally. VII - Facial movement intact bilaterally. VIII - Hearing & vestibular intact bilaterally. X - Palate elevates symmetrically. XI - Chin turning & shoulder shrug intact bilaterally. XII - Tongue protrusion intact.  Motor Strength - The patient's strength was normal in all extremities and pronator drift was absent.  Bulk was normal and fasciculations were absent.   Motor Tone - Muscle tone was assessed at the neck and appendages and was normal.  Reflexes - The patient's reflexes were symmetrical in all extremities and he had no pathological reflexes.  Sensory - Light touch, temperature/pinprick were assessed and were symmetrical except mildly decreased light touch sensation at left hand and the sole of left foot.    Coordination - The patient had normal movements in the hands with no ataxia or dysmetria.  Tremor was absent.  Gait and Station - deferred.   ASSESSMENT/PLAN Larry Houston is a 64 y.o. 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   CT head No acute abnormality.   MRI  L corona radiata infarct. R paramedian pontine infarct. R superior cerebellar subacute infarct. ? L frontal lobe subacute infarct. Old corpus callosum, R thalamic, R cerebellar infarcts.   CTA head intracranial  R VA + R PICA opacification, L VA small w/ flow. 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 Aortic arch, proximal major vessels and L ICA bifurcation atherosclerosis.   2D Echo pending  LDL 55 after dose of lipitor  HgbA1c 5.8  UDS pending  VTE prophylaxis - Lovenox 40 mg sq daily   No antithrombotic prior to admission, initially put on aspirin 325, now on aspirin 81 mg daily and clopidogrel 75 mg daily. Continue DAPT x 3 weeks then aspirin alone   Therapy recommendations:  pending   Disposition:  pending   Hypertensive Urgency  BP as high as 197/122  BP in the 170s today . Permissive hypertension (OK if < 220/120) but gradually normalize in 2-3 days . Pt admitted noncompliance with BP meds at home . Long-term BP goal normotensive  Lipids  Home meds:  No statin  Now on lipitor 80   LDL 55 after night dose of lipitor, at goal < 70  Continue statin at discharge    Tobacco abuse  Current smoker  Smoking cessation counseling provided  Pt is willing to quit  Other Stroke Risk Factors  ETOH abuse, on CIWA protocol, advised to drink no more than 2 drink(s) a day  Overweight, Body mass index is 27.05 kg/m., recommend weight loss, diet and exercise as appropriate   Other Active Problems  Chronic thrombocytopenia, PLT 110   Hospital day # 1  Neurology will sign off. Please call with questions. Pt will follow up with stroke clinic NP at [facility] in about 4 weeks. Thanks for the consult.  Marvel Plan, MD PhD Stroke Neurology 07/23/2019 11:36 AM    To contact Stroke Continuity provider, please refer to [email or web address]. After hours, contact General Neurology

## 2019-07-24 ENCOUNTER — Other Ambulatory Visit: Payer: Self-pay | Admitting: Neurology

## 2019-07-24 DIAGNOSIS — I426 Alcoholic cardiomyopathy: Secondary | ICD-10-CM

## 2019-07-24 NOTE — Plan of Care (Signed)
Pt discharged yesterday before TTE reported. His TTE result still pending at the time of discharge. Today I check the TTE result it showed EF 35-40%, likely ischemic given his cocaine use and HTN. I put in outpt referral to cardiology.   Marvel Plan, MD PhD Stroke Neurology 07/24/2019 12:17 PM

## 2019-08-06 MED FILL — METOPROLOL SUCCINATE ER 50: 50 | 30 days supply | Qty: 30 | Fill #0

## 2019-08-06 MED FILL — DOXYCYCLINE HYCLATE 100 MG: 100 | 7 days supply | Qty: 14 | Fill #0

## 2019-08-07 ENCOUNTER — Other Ambulatory Visit (HOSPITAL_COMMUNITY): Payer: Self-pay | Admitting: Family Medicine

## 2019-08-07 MED FILL — DESONIDE 0.05 % OINT: 0.05 | 30 days supply | Qty: 60 | Fill #0

## 2019-08-12 ENCOUNTER — Other Ambulatory Visit (HOSPITAL_COMMUNITY): Payer: Self-pay | Admitting: Family Medicine

## 2019-08-25 ENCOUNTER — Inpatient Hospital Stay: Payer: Self-pay | Admitting: Adult Health

## 2019-08-28 ENCOUNTER — Other Ambulatory Visit (HOSPITAL_COMMUNITY): Payer: Self-pay | Admitting: Family Medicine

## 2019-08-28 MED FILL — GABAPENTIN 300 MG CAPSULE: 300 | 90 days supply | Qty: 90 | Fill #0

## 2019-09-04 ENCOUNTER — Other Ambulatory Visit (HOSPITAL_COMMUNITY): Payer: Self-pay | Admitting: Family Medicine

## 2019-09-04 MED FILL — CLOPIDOGREL 75 MG TABLET: 75 | 90 days supply | Qty: 90 | Fill #0

## 2019-09-04 MED FILL — ATORVASTATIN 80 MG TABLET: 80 | 90 days supply | Qty: 90 | Fill #0

## 2019-09-04 MED FILL — AMLODIPINE BESYLATE 5 MG TA: 5 | 90 days supply | Qty: 90 | Fill #0

## 2019-09-07 ENCOUNTER — Encounter (HOSPITAL_COMMUNITY): Payer: Self-pay | Admitting: Emergency Medicine

## 2019-09-07 ENCOUNTER — Other Ambulatory Visit: Payer: Self-pay

## 2019-09-07 ENCOUNTER — Emergency Department (HOSPITAL_COMMUNITY)
Admission: EM | Admit: 2019-09-07 | Discharge: 2019-09-07 | Disposition: A | Discharge status: Home / Self Care | Payer: Self-pay | Attending: Emergency Medicine | Admitting: Emergency Medicine

## 2019-09-07 DIAGNOSIS — Z7902 Long term (current) use of antithrombotics/antiplatelets: Secondary | ICD-10-CM | POA: Insufficient documentation

## 2019-09-07 DIAGNOSIS — Z7982 Long term (current) use of aspirin: Secondary | ICD-10-CM | POA: Insufficient documentation

## 2019-09-07 DIAGNOSIS — Z79899 Other long term (current) drug therapy: Secondary | ICD-10-CM | POA: Insufficient documentation

## 2019-09-07 DIAGNOSIS — K610 Anal abscess: Secondary | ICD-10-CM | POA: Insufficient documentation

## 2019-09-07 DIAGNOSIS — Z8673 Personal history of transient ischemic attack (TIA), and cerebral infarction without residual deficits: Secondary | ICD-10-CM | POA: Insufficient documentation

## 2019-09-07 DIAGNOSIS — I10 Essential (primary) hypertension: Secondary | ICD-10-CM | POA: Insufficient documentation

## 2019-09-07 DIAGNOSIS — Z87891 Personal history of nicotine dependence: Secondary | ICD-10-CM | POA: Insufficient documentation

## 2019-09-07 HISTORY — DX: Cerebral infarction, unspecified: I63.9

## 2019-09-07 MED ORDER — CIPROFLOXACIN HCL 500 MG PO TABS
500.0000 mg | ORAL_TABLET | Freq: Two times a day (BID) | ORAL | 0 refills | Status: AC
Start: 2019-09-07 — End: 2019-09-14

## 2019-09-07 MED ORDER — METRONIDAZOLE 500 MG PO TABS: 500.0000 mg | ORAL_TABLET | Freq: Two times a day (BID) | ORAL | 0 refills | Status: AC

## 2019-09-07 MED ORDER — LIDOCAINE HCL 2 % IJ SOLN
10.0000 mL | Freq: Once | INTRAMUSCULAR | Status: AC
  Administered 2019-09-07: 200 mg via INTRADERMAL
  Filled 2019-09-07: qty 20

## 2019-09-07 NOTE — ED Provider Notes (Signed)
[facility] EMERGENCY DEPARTMENT Provider Note   CSN: [id number] Arrival date & time: 09/07/19  4098     History Chief Complaint  Patient presents with  . Recurrent Skin Infections    Larry Houston is a 64 y.o. male.  HPI   64 y/o M with a h/o ETOH abuse, arthritis, HTN, CVA who presents to the ED today for eval of possible abscess. He c/o pain to the left buttock area that has been present for the last several days. He has been trying warm soaks at home however sxs have persisted and he has not had any drainage from the area. Denies any fevers systemic sxs.   Past Medical History:  Diagnosis Date  . Alcohol abuse   . Arthritis    lumbar stenosis   . Hypertension   . Stroke (HCC)   . Venous malformation 09/03/2016   Right lower extremity venous malformation    Patient Active Problem List   Diagnosis Date Noted  . Ischemic cerebrovascular accident (CVA) (HCC) 07/23/2019  . History of CVA (cerebrovascular accident) 07/22/2019  . CVA (cerebral vascular accident) (HCC) 07/22/2019  . 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 Venous malformation 09/13/2016  . Cellulitis of groin 02/18/2015  . Abscess of groin 02/18/2015  . Sepsis (HCC) 02/18/2015  . Scrotal abscess   . S/P lumbar laminectomy 01/26/2015  . Essential hypertension 04/04/2014  . Epistaxis 04/04/2014  . Alcohol use (HCC) 04/04/2014  . Thrombocytopenia (HCC) 04/04/2014    Past Surgical History:  Procedure Laterality Date  . IR RADIOLOGIST EVAL & MGMT  04/05/2016  . IR RADIOLOGIST EVAL & MGMT  10/17/2016  . IR RADIOLOGIST EVAL & MGMT  12/05/2016  . IR SCLEROTHERAPY OF A FLUID COLLECTION Right 09/13/2016   Venous malformation [Q27.9]   RLE  . IR SCLEROTHERAPY OF A FLUID COLLECTION  09/13/2016  . IR US GUIDE VASC ACCESS RIGHT  09/13/2016  . LUMBAR LAMINECTOMY/DECOMPRESSION MICRODISCECTOMY Bilateral 01/26/2015   Procedure: Laminectomy and Foraminotomy - L4-L5 - bilateral;  Surgeon: Tia Alert, MD;  Location: [facility];  Service: Neurosurgery;  Laterality: Bilateral;  Laminectomy and Foraminotomy - L4-L5 - bilateral  . NASAL ENDOSCOPY WITH EPISTAXIS CONTROL N/A 04/04/2014   Procedure: NASAL ENDOSCOPY WITH EPISTAXIS CONTROL;  Surgeon: Melvenia Beam, MD;  Location: [facility] OR;  Service: ENT;  Laterality: N/A;  . NECK SURGERY  1992   posterior fusion- cervical   . RADIOLOGY WITH ANESTHESIA N/A 09/13/2016   Procedure: percutaneous sclerotherapy with dehydrated ethanol;  Surgeon: Malachy Moan, MD;  Location: [facility] OR;  Service: Radiology;  Laterality: N/A;       Family History  Problem Relation Age of Onset  . Diabetes Mother   . Hypertension Mother   . Anemia Mother        Died of aplastic anemia  . Lung disease Father   . Heart attack Brother   . Heart disease Sister        One sister had heart arrest    Social History   Tobacco Use  . Smoking status: Former Smoker    Years: 7.00    Quit date: 01/13/2006    Years since quitting: 13.6  . Smokeless tobacco: Never Used  Vaping Use  . Vaping Use: Never used  Substance Use Topics  . Alcohol use: Yes    Comment: occasional   . Drug use: No    Home Medications Prior to Admission medications   Medication Sig Start Date End Date  Taking? Authorizing Provider  amLODipine (NORVASC) 5 MG tablet Take 1 tablet (5 mg total) by mouth daily before breakfast. 07/23/19   Lonia Blood, MD  aspirin EC 81 MG EC tablet Take 1 tablet (81 mg total) by mouth daily. Swallow whole. 07/24/19   Lonia Blood, MD  atorvastatin (LIPITOR) 80 MG tablet Take 1 tablet (80 mg total) by mouth daily. 07/24/19   Lonia Blood, MD  ciprofloxacin (CIPRO) 500 MG tablet Take 1 tablet (500 mg total) by mouth every 12 (twelve) hours for 7 days. 09/07/19 09/14/19  Javell Blackburn S, PA-C  clopidogrel (PLAVIX) 75 MG tablet Take 1 tablet (75 mg total) by mouth daily. 07/24/19   Lonia Blood, MD  desonide (DESOWEN) 0.05 % ointment Apply 1  application topically 2 (two) times daily as needed (to groin).  04/29/19   [provider]  econazole nitrate 1 % cream Apply 1 application topically daily as needed (affected area).  Patient not taking: Reported on 07/22/2019 12/27/14   [provider]  etodolac (LODINE) 400 MG tablet Take 400 mg by mouth daily as needed (for pain).    [provider]  gabapentin (NEURONTIN) 300 MG capsule Take 1 capsule (300 mg total) by mouth 3 (three) times daily. Patient taking differently: Take 300 mg by mouth daily as needed (for neuropathy).  04/05/16   Tyrell Antonio, MD  metroNIDAZOLE (FLAGYL) 500 MG tablet Take 1 tablet (500 mg total) by mouth 2 (two) times daily for 7 days. 09/07/19 09/14/19  Tawney Vanorman S, PA-C    Allergies    Penicillins  Review of Systems   Review of Systems  Constitutional: Negative for fever.  Respiratory: Negative for shortness of breath.   Cardiovascular: Negative for chest pain.  Musculoskeletal: Negative for back pain.  Skin:       Possible abscess  Neurological: Negative for headaches.    Physical Exam Updated Vital Signs BP (!) 142/92   Pulse 81   Temp 98.2 F (36.8 C) (Oral)   Resp 16   Ht 6\' 1"  (1.854 m)   Wt 99.8 kg   SpO2 98%   BMI 29.03 kg/m   Physical Exam Constitutional:      General: He is not in acute distress.    Appearance: He is well-developed.  Eyes:     Conjunctiva/sclera: Conjunctivae normal.  Cardiovascular:     Rate and Rhythm: Normal rate.  Pulmonary:     Effort: Pulmonary effort is normal.  Genitourinary:    Comments: Chaperone present. 1.5 cm perianal abscess present to the left side of the anus. DRE performed and there is no TTP/fluctuance to suggest perirectal involvement.  Skin:    General: Skin is warm and dry.  Neurological:     Mental Status: He is alert and oriented to person, place, and time.     ED Results / Procedures / Treatments   Labs (all labs ordered are listed, but only  abnormal results are displayed) Labs Reviewed - No data to display  EKG None  Radiology No results found.  Procedures . Incision and Drainage  Date/Time: 09/07/2019 11:21 AM Performed by: 09/09/2019, PA-C Authorized by: Karrie Meres, PA-C   Consent:    Consent obtained:  Verbal   Consent given by:  Patient   Risks discussed:  Bleeding, incomplete drainage and pain   Alternatives discussed:  No treatment Location:    Type:  Abscess   Size:  1.5   Location:  Anogenital  Anogenital location:  Perianal Pre-procedure details:    Skin preparation:  Betadine Anesthesia (see MAR for exact dosages):    Anesthesia method:  Local infiltration   Local anesthetic:  Lidocaine 2% w/o epi Procedure type:    Complexity:  Simple Procedure details:    Needle aspiration: no     Incision types:  Stab incision   Incision depth:  Dermal   Scalpel blade:  11   Drainage:  Purulent   Drainage amount:  Moderate   Wound treatment:  Wound left open   Packing materials:  None Post-procedure details:    Patient tolerance of procedure:  Tolerated well, no immediate complications   (including critical care time)  Medications Ordered in ED Medications  lidocaine (XYLOCAINE) 2 % (with pres) injection 200 mg (200 mg Intradermal Given by Other 09/07/19 1043)    ED Course  I have reviewed the triage vital signs and the nursing notes.  Pertinent labs & imaging results that were available during my care of the patient were reviewed by me and considered in my medical decision making (see chart for details).    MDM Rules/Calculators/A&P                          64 year old male presenting for evaluation of abscess present for the last week.  On exam has evidence of perianal abscess to the  left buttock area.  Incision and drainage procedure was performed with significant drainage noted.  Abscess not large enough to warrant packing or drain.  Patient placed on antibiotics.  Given  information for general surgery follow-up.  Advised on specific return precautions. pt Voiced understanding plan reasons to return.  All Questions answered. Pt stable for discharge.   Final Clinical Impression(s) / ED Diagnoses Final diagnoses:  Perianal abscess    Rx / DC Orders ED Discharge Orders         Ordered    ciprofloxacin (CIPRO) 500 MG tablet  Every 12 hours        09/07/19 1119    metroNIDAZOLE (FLAGYL) 500 MG tablet  2 times daily        09/07/19 [address] Reia Viernes S, PA-C 09/07/19 1122    Virgina Norfolk, DO 09/07/19 1124

## 2019-09-07 NOTE — ED Triage Notes (Signed)
C/o abscess on right buttock- started Wednesday- will not "drain"- hx of same Had a stroke last month Fully vaccinated.

## 2019-09-07 NOTE — Discharge Instructions (Signed)
You were given a prescription for antibiotics. Please take the antibiotic prescription fully.   Please follow up with your primary care provider or with the general surgeon within 5-7 days for re-evaluation of your symptoms. If you do not have a primary care provider, information for a healthcare clinic has been provided for you to make arrangements for follow up care. Please return to the emergency department for any new or worsening symptoms.

## 2019-09-11 ENCOUNTER — Ambulatory Visit: Payer: Self-pay | Admitting: Interventional Cardiology

## 2019-09-23 ENCOUNTER — Encounter: Payer: Self-pay | Admitting: General Practice

## 2019-10-01 ENCOUNTER — Inpatient Hospital Stay: Payer: Self-pay | Admitting: Adult Health

## 2019-10-08 ENCOUNTER — Inpatient Hospital Stay: Payer: Self-pay | Admitting: Adult Health

## 2019-10-08 ENCOUNTER — Encounter: Payer: Self-pay | Admitting: Adult Health

## 2019-10-08 NOTE — Progress Notes (Deleted)
[facility] [address] [place]. [place] [address] [phone]       HOSPITAL FOLLOW UP NOTE  Mr. Larry Houston Date of Birth:  26-Feb-1955 Medical Record Number:  [id number]   Reason for Referral:  hospital stroke follow up    SUBJECTIVE:   CHIEF COMPLAINT:  No chief complaint on file.   HPI:   Mr. Larry Houston is a 64 y.o. male with history of lumbar spinal stenosis s/p surgery ~5 years ago, EtOH abuse, tobacco abuse, HTN and RLE venous malformation  who presented on 07/22/2019 with L sided numbness and tingling, slurred speech and HA.  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  MRI showed old strokes within corpus callosum, R thalamic and R cerebellar.  CTA neck showed R VA occlusion vs possible dissection. TTE EF 35 to 40% likely ischemic given cocaine use (+UDS) and referred to cardiology.  Recommended DAPT for 3 weeks and aspirin alone.  Presented with hypertensive urgency with BP is 197/122 with patient reporting noncompliance with BP meds at home and recommended long-term BP goal normotensive range.  LDL 55 after dose of atorvastatin and recommended continuation of atorvastatin 80 mg daily.  No evidence or history of DM with A1c 5.8.  Current tobacco use with smoking cessation counseling provided.  Other stroke risk factors include EtOH use, + UDS for cocaine and prior strokes on imaging.  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   CT head No acute abnormality.   MRI  L corona radiata infarct. R paramedian pontine infarct. R superior cerebellar subacute infarct. ? L frontal lobe subacute infarct. Old corpus callosum, R thalamic, R cerebellar infarcts.   CTA head intracranial R VA + R PICA opacification, L  VA small w/ flow. 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 Aortic arch, proximal major vessels and L ICA bifurcation atherosclerosis.   2D Echo EF 35-40%  LDL 55 after dose of lipitor  HgbA1c 5.8  UDS +cocaine   VTE prophylaxis - Lovenox 40 mg sq daily   No antithrombotic prior to admission, initially put on aspirin 325, now on aspirin 81 mg daily and clopidogrel 75 mg daily. Continue DAPT x 3 weeks then aspirin alone   Therapy recommendations:  No therapy evaluation  Disposition:  home     ROS:   14 system review of systems performed and negative with exception of ***  PMH:  Past Medical History:  Diagnosis Date  . Alcohol abuse   . Arthritis    lumbar stenosis   . Hypertension   . Stroke (HCC)   . Venous malformation 09/03/2016   Right lower extremity venous malformation    PSH:  Past Surgical History:  Procedure Laterality Date  . IR RADIOLOGIST EVAL & MGMT  04/05/2016  . IR RADIOLOGIST EVAL & MGMT  10/17/2016  . IR RADIOLOGIST EVAL & MGMT  12/05/2016  . IR SCLEROTHERAPY OF A FLUID COLLECTION Right 09/13/2016   Venous malformation [Q27.9]   RLE  . IR SCLEROTHERAPY OF A FLUID COLLECTION  09/13/2016  . IR US GUIDE VASC ACCESS RIGHT  09/13/2016  . LUMBAR LAMINECTOMY/DECOMPRESSION MICRODISCECTOMY Bilateral 01/26/2015   Procedure: Laminectomy and Foraminotomy - L4-L5 - bilateral;  Surgeon: Tia Alert, MD;  Location: [facility];  Service: Neurosurgery;  Laterality: Bilateral;  Laminectomy and Foraminotomy - L4-L5 - bilateral  . NASAL ENDOSCOPY WITH EPISTAXIS CONTROL N/A 04/04/2014   Procedure: NASAL ENDOSCOPY WITH EPISTAXIS CONTROL;  Surgeon: Melvenia BeamMitchell Gore, MD;  [facility];  Service: ENT;  Laterality: N/A;  . NECK SURGERY  1992   posterior fusion- cervical   . RADIOLOGY WITH ANESTHESIA N/A 09/13/2016   Procedure: percutaneous sclerotherapy with dehydrated ethanol;  Surgeon: Malachy MoanMcCullough, Heath, MD;  [facility];   Service: Radiology;  Laterality: N/A;    Social History:  Social History   Socioeconomic History  . Marital status: Divorced    Spouse name: Not on file  . Number of children: Not on file  . Years of education: Not on file  . Highest education level: Not on file  Occupational History  . Not on file  Tobacco Use  . Smoking status: Former Smoker    Years: 7.00    Quit date: 01/13/2006    Years since quitting: 13.7  . Smokeless tobacco: Never Used  Vaping Use  . Vaping Use: Never used  Substance and Sexual Activity  . Alcohol use: Yes    Comment: occasional   . Drug use: No  . Sexual activity: Not on file  Other Topics Concern  . Not on file  Social History Narrative  . Not on file   Social Determinants of Health   Financial Resource Strain:   . Difficulty of Paying Living Expenses: Not on file  Food Insecurity:   . Worried About Programme researcher, broadcasting/film/videounning Out of Food in the Last Year: Not on file  . Ran Out of Food in the Last Year: Not on file  Transportation Needs:   . Lack of Transportation (Medical): Not on file  . Lack of Transportation (Non-Medical): Not on file  Physical Activity:   . Days of Exercise per Week: Not on file  . Minutes of Exercise per Session: Not on file  Stress:   . Feeling of Stress : Not on file  Social Connections:   . Frequency of Communication with Friends and Family: Not on file  . Frequency of Social Gatherings with Friends and Family: Not on file  . Attends Religious Services: Not on file  . Active Member of Clubs or Organizations: Not on file  . Attends BankerClub or Organization Meetings: Not on file  . Marital Status: Not on file  Intimate Partner Violence:   . Fear of Current or Ex-Partner: Not on file  . Emotionally Abused: Not on file  . Physically Abused: Not on file  . Sexually Abused: Not on file    Family History:  Family History  Problem Relation Age of Onset  . Diabetes Mother   . Hypertension Mother   . Anemia Mother        Died of  aplastic anemia  . Lung disease Father   . Heart attack Brother   . Heart disease Sister        One sister had heart arrest    Medications:   Current Outpatient Medications on File Prior to Visit  Medication Sig Dispense Refill  . amLODipine (NORVASC) 5 MG tablet Take 1 tablet (5 mg total) by mouth daily before breakfast. 30 tablet 1  . aspirin EC 81 MG EC tablet Take 1 tablet (81 mg total) by mouth daily. Swallow whole. 30 tablet 11  . atorvastatin (LIPITOR) 80 MG tablet Take 1 tablet (80 mg total) by mouth daily. 30 tablet  0  . clopidogrel (PLAVIX) 75 MG tablet Take 1 tablet (75 mg total) by mouth daily. 21 tablet 0  . desonide (DESOWEN) 0.05 % ointment Apply 1 application topically 2 (two) times daily as needed (to groin).     Marland Kitchen econazole nitrate 1 % cream Apply 1 application topically daily as needed (affected area).  (Patient not taking: Reported on 07/22/2019)  0  . etodolac (LODINE) 400 MG tablet Take 400 mg by mouth daily as needed (for pain).    Marland Kitchen gabapentin (NEURONTIN) 300 MG capsule Take 1 capsule (300 mg total) by mouth 3 (three) times daily. (Patient taking differently: Take 300 mg by mouth daily as needed (for neuropathy). ) 90 capsule 1   No current facility-administered medications on file prior to visit.    Allergies:   Allergies  Allergen Reactions  . Penicillins Hives    Has patient had a PCN reaction causing immediate rash, facial/tongue/throat swelling, SOB or lightheadedness with hypotension: No Has patient had a PCN reaction causing severe rash involving mucus membranes or skin necrosis: No Has patient had a PCN reaction that required hospitalization No Has patient had a PCN reaction occurring within the last 10 years: No If all of the above answers are "NO", then may proceed with Cephalosporin use.      OBJECTIVE:  Physical Exam  There were no vitals filed for this visit. There is no height or weight on file to calculate BMI. No exam data present  No  flowsheet data found.   General: well developed, well nourished, seated, in no evident distress Head: head normocephalic and atraumatic.   Neck: supple with no carotid or supraclavicular bruits Cardiovascular: regular rate and rhythm, no murmurs Musculoskeletal: no deformity Skin:  no rash/petichiae Vascular:  Normal pulses all extremities   Neurologic Exam Mental Status: Awake and fully alert. Oriented to place and time. Recent and remote memory intact. Attention span, concentration and fund of knowledge appropriate. Mood and affect appropriate.  Cranial Nerves: Fundoscopic exam reveals sharp disc margins. Pupils equal, briskly reactive to light. Extraocular movements full without nystagmus. Visual fields full to confrontation. Hearing intact. Facial sensation intact. Face, tongue, palate moves normally and symmetrically.  Motor: Normal bulk and tone. Normal strength in all tested extremity muscles. Sensory.: intact to touch , pinprick , position and vibratory sensation.  Coordination: Rapid alternating movements normal in all extremities. Finger-to-nose and heel-to-shin performed accurately bilaterally. Gait and Station: Arises from chair without difficulty. Stance is normal. Gait demonstrates normal stride length and balance Reflexes: 1+ and symmetric. Toes downgoing.     NIHSS  *** Modified Rankin  ***      ASSESSMENT/PLAN: BANKS CHAIKIN is a 64 y.o. year old male presented with L sided numbness and tingling, slurred speech and HA on 07/22/2019 with stroke work-up revealing right pontine and small L CR/SO acute infarcts as well as right cerebellar vermis subacute small infarct, likely secondary to synchronized small vessel disease. 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      1. R pontine, L CR/SO, R cerebellar strokes : Residual deficit: ***. Continue aspirin 81 mg daily  and  atorvastatin 80 mg daily for secondary stroke prevention. Close PCP follow up for aggressive stroke risk factor management  2. HTN: BP goal <130/90. Continue f/u with PCP 3. HLD: LDL goal <70. Recent LDL 55 (after atorvastatin dose).  Continue atorvastatin 80 mg daily.  Managed and prescribed  by PCP.   4. 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 Tobacco use: 6. Cocaine use: 7. EtOH use:    Follow up in *** or call earlier if needed   I spent *** minutes of face-to-face and non-face-to-face time with patient.  This included previsit chart review, lab review, study review, order entry, electronic health record documentation, patient education regarding recent stroke, residual deficits, importance of managing stroke risk factors and answered all questions to patient satisfaction     Ihor Austin, [facility]  [facility] [address] [place], [place] [address]  Phone [phone] Fax [phone] Note: This document was prepared with digital dictation and possible smart phrase technology. Any transcriptional errors that result from this process are unintentional.

## 2019-12-09 MED FILL — AMLODIPINE BESYLATE 5 MG TA: 5 | 90 days supply | Qty: 90 | Fill #1

## 2019-12-09 MED FILL — ATORVASTATIN 80 MG TABLET: 80 | 90 days supply | Qty: 90 | Fill #1

## 2019-12-09 MED FILL — CLOPIDOGREL 75 MG TABLET: 75 | 90 days supply | Qty: 90 | Fill #1

## 2019-12-31 MED FILL — DESONIDE 0.05 % OINT: 0.05 | 14 days supply | Qty: 60 | Fill #0

## 2020-01-14 MED FILL — ETODOLAC 400 MG TABS: 400 | 30 days supply | Qty: 60 | Fill #0

## 2020-01-14 MED FILL — GABAPENTIN 300 MG CAPSULE: 300 | 90 days supply | Qty: 90 | Fill #0

## 2020-01-14 MED FILL — DESONIDE 0.05 % OINT: 0.05 | 14 days supply | Qty: 60 | Fill #0

## 2020-03-15 MED FILL — CLOPIDOGREL 75 MG TABLET: 75 | 90 days supply | Qty: 90 | Fill #2

## 2020-03-15 MED FILL — AMLODIPINE BESYLATE 5 MG TA: 5 | 90 days supply | Qty: 90 | Fill #2

## 2020-03-31 ENCOUNTER — Other Ambulatory Visit (HOSPITAL_BASED_OUTPATIENT_CLINIC_OR_DEPARTMENT_OTHER): Payer: Self-pay

## 2020-06-01 ENCOUNTER — Other Ambulatory Visit (HOSPITAL_COMMUNITY): Payer: Self-pay

## 2020-06-01 MED FILL — Amlodipine Besylate Tab 5 MG (Base Equivalent): ORAL | 90 days supply | Qty: 90 | Fill #0 | Status: AC

## 2020-08-31 ENCOUNTER — Other Ambulatory Visit (HOSPITAL_COMMUNITY): Payer: Self-pay

## 2020-09-01 ENCOUNTER — Other Ambulatory Visit (HOSPITAL_COMMUNITY): Payer: Self-pay

## 2020-09-02 ENCOUNTER — Other Ambulatory Visit (HOSPITAL_COMMUNITY): Payer: Self-pay

## 2020-09-02 MED ORDER — AMLODIPINE BESYLATE 5 MG PO TABS
ORAL_TABLET | ORAL | 0 refills | Status: DC
  Filled 2020-09-02: qty 30, 30d supply, fill #0

## 2020-09-03 ENCOUNTER — Other Ambulatory Visit (HOSPITAL_COMMUNITY): Payer: Self-pay

## 2020-09-23 ENCOUNTER — Other Ambulatory Visit (HOSPITAL_COMMUNITY): Payer: Self-pay

## 2020-09-26 ENCOUNTER — Other Ambulatory Visit (HOSPITAL_COMMUNITY): Payer: Self-pay

## 2020-11-18 ENCOUNTER — Other Ambulatory Visit (HOSPITAL_COMMUNITY): Payer: Self-pay

## 2020-11-22 ENCOUNTER — Other Ambulatory Visit (HOSPITAL_COMMUNITY): Payer: Self-pay

## 2020-11-22 MED ORDER — AMLODIPINE BESYLATE 5 MG PO TABS
ORAL_TABLET | ORAL | 0 refills | Status: DC
Start: 2020-11-22 — End: 2023-10-14
  Filled 2020-11-22: qty 30, 30d supply, fill #0

## 2020-11-23 ENCOUNTER — Other Ambulatory Visit (HOSPITAL_COMMUNITY): Payer: Self-pay

## 2021-08-09 ENCOUNTER — Other Ambulatory Visit (HOSPITAL_COMMUNITY): Payer: Self-pay

## 2022-11-03 ENCOUNTER — Other Ambulatory Visit (HOSPITAL_COMMUNITY): Payer: Self-pay

## 2022-11-26 DIAGNOSIS — F17210 Nicotine dependence, cigarettes, uncomplicated: Secondary | ICD-10-CM | POA: Diagnosis not present

## 2022-11-26 DIAGNOSIS — E785 Hyperlipidemia, unspecified: Secondary | ICD-10-CM | POA: Diagnosis not present

## 2022-11-26 DIAGNOSIS — Z8673 Personal history of transient ischemic attack (TIA), and cerebral infarction without residual deficits: Secondary | ICD-10-CM | POA: Diagnosis not present

## 2022-11-26 DIAGNOSIS — R2681 Unsteadiness on feet: Secondary | ICD-10-CM | POA: Diagnosis not present

## 2022-11-26 DIAGNOSIS — I10 Essential (primary) hypertension: Secondary | ICD-10-CM | POA: Diagnosis not present

## 2022-11-26 DIAGNOSIS — E669 Obesity, unspecified: Secondary | ICD-10-CM | POA: Diagnosis not present

## 2022-11-26 DIAGNOSIS — R7303 Prediabetes: Secondary | ICD-10-CM | POA: Diagnosis not present

## 2022-11-26 DIAGNOSIS — Z6830 Body mass index (BMI) 30.0-30.9, adult: Secondary | ICD-10-CM | POA: Diagnosis not present

## 2022-11-26 DIAGNOSIS — Z008 Encounter for other general examination: Secondary | ICD-10-CM | POA: Diagnosis not present

## 2022-11-27 ENCOUNTER — Other Ambulatory Visit (HOSPITAL_COMMUNITY): Payer: Self-pay

## 2022-11-27 MED ORDER — ATORVASTATIN CALCIUM 80 MG PO TABS
80.0000 mg | ORAL_TABLET | Freq: Every evening | ORAL | 0 refills | Status: DC
  Filled 2022-11-27: qty 90, 90d supply, fill #0

## 2022-11-27 MED ORDER — AMLODIPINE BESYLATE 5 MG PO TABS
5.0000 mg | ORAL_TABLET | Freq: Every day | ORAL | 0 refills | Status: DC
  Filled 2022-11-27: qty 90, 90d supply, fill #0

## 2022-11-28 ENCOUNTER — Other Ambulatory Visit (HOSPITAL_COMMUNITY): Payer: Self-pay

## 2023-02-28 ENCOUNTER — Other Ambulatory Visit (HOSPITAL_COMMUNITY): Payer: Self-pay

## 2023-03-01 ENCOUNTER — Other Ambulatory Visit (HOSPITAL_COMMUNITY): Payer: Self-pay

## 2023-03-01 DIAGNOSIS — Z760 Encounter for issue of repeat prescription: Secondary | ICD-10-CM | POA: Diagnosis not present

## 2023-03-01 DIAGNOSIS — Z008 Encounter for other general examination: Secondary | ICD-10-CM | POA: Diagnosis not present

## 2023-03-01 MED ORDER — ATORVASTATIN CALCIUM 80 MG PO TABS
80.0000 mg | ORAL_TABLET | Freq: Every evening | ORAL | 0 refills | Status: DC
  Filled 2023-03-01: qty 90, 90d supply, fill #0

## 2023-03-01 MED ORDER — AMLODIPINE BESYLATE 5 MG PO TABS
5.0000 mg | ORAL_TABLET | Freq: Every day | ORAL | 0 refills | Status: DC
  Filled 2023-03-01: qty 90, 90d supply, fill #0

## 2023-03-05 ENCOUNTER — Other Ambulatory Visit (HOSPITAL_COMMUNITY): Payer: Self-pay

## 2023-03-14 DIAGNOSIS — Z6827 Body mass index (BMI) 27.0-27.9, adult: Secondary | ICD-10-CM | POA: Diagnosis not present

## 2023-03-14 DIAGNOSIS — E663 Overweight: Secondary | ICD-10-CM | POA: Diagnosis not present

## 2023-03-14 DIAGNOSIS — Z008 Encounter for other general examination: Secondary | ICD-10-CM | POA: Diagnosis not present

## 2023-03-14 DIAGNOSIS — R2681 Unsteadiness on feet: Secondary | ICD-10-CM | POA: Diagnosis not present

## 2023-03-14 DIAGNOSIS — E785 Hyperlipidemia, unspecified: Secondary | ICD-10-CM | POA: Diagnosis not present

## 2023-03-14 DIAGNOSIS — I10 Essential (primary) hypertension: Secondary | ICD-10-CM | POA: Diagnosis not present

## 2023-06-14 ENCOUNTER — Other Ambulatory Visit (HOSPITAL_COMMUNITY): Payer: Self-pay

## 2023-06-28 ENCOUNTER — Other Ambulatory Visit (HOSPITAL_COMMUNITY): Payer: Self-pay

## 2023-07-01 ENCOUNTER — Other Ambulatory Visit (HOSPITAL_COMMUNITY): Payer: Self-pay

## 2023-08-12 ENCOUNTER — Emergency Department (HOSPITAL_COMMUNITY)
Admission: EM | Admit: 2023-08-12 | Discharge: 2023-08-12 | Disposition: A | Discharge status: Home / Self Care | Attending: Emergency Medicine | Admitting: Emergency Medicine

## 2023-08-12 ENCOUNTER — Other Ambulatory Visit (HOSPITAL_COMMUNITY): Payer: Self-pay

## 2023-08-12 ENCOUNTER — Other Ambulatory Visit: Payer: Self-pay

## 2023-08-12 ENCOUNTER — Encounter (HOSPITAL_COMMUNITY): Payer: Self-pay

## 2023-08-12 DIAGNOSIS — Z7982 Long term (current) use of aspirin: Secondary | ICD-10-CM | POA: Insufficient documentation

## 2023-08-12 DIAGNOSIS — G8929 Other chronic pain: Secondary | ICD-10-CM | POA: Diagnosis not present

## 2023-08-12 DIAGNOSIS — Z7901 Long term (current) use of anticoagulants: Secondary | ICD-10-CM | POA: Diagnosis not present

## 2023-08-12 DIAGNOSIS — Z79899 Other long term (current) drug therapy: Secondary | ICD-10-CM | POA: Insufficient documentation

## 2023-08-12 DIAGNOSIS — I10 Essential (primary) hypertension: Secondary | ICD-10-CM | POA: Diagnosis not present

## 2023-08-12 DIAGNOSIS — M5441 Lumbago with sciatica, right side: Secondary | ICD-10-CM | POA: Insufficient documentation

## 2023-08-12 DIAGNOSIS — M5442 Lumbago with sciatica, left side: Secondary | ICD-10-CM | POA: Diagnosis not present

## 2023-08-12 MED ORDER — NAPROXEN 500 MG PO TABS
500.0000 mg | ORAL_TABLET | Freq: Two times a day (BID) | ORAL | 0 refills | Status: DC
  Filled 2023-08-12 – 2023-08-19 (×2): qty 30, 15d supply, fill #0

## 2023-08-12 NOTE — ED Notes (Signed)
 Pt left to go move car just before DC was set and never returned. DC papers were not given to pt.

## 2023-08-12 NOTE — ED Provider Notes (Signed)
  EMERGENCY DEPARTMENT AT [facility] Provider Note   CSN: [id number] Arrival date & time: 08/12/23  9049     Patient presents with: No chief complaint on file.   Larry Houston is a 68 y.o. male.   HPI Patient is a 68 year old male presenting to the ED today for concerns for possible spinal stenosis flareup, complaining of low back pain radiating to both legs described as sharp and shooting.  States that this has been ongoing for several months but progressively worse.  States that he has not had a primary care provider at this point and has not followed up with neurosurgery.  Denies red flag symptoms: Saddle paresthesias, urinary/fecal incontinence, unexplained weight loss, fever, trauma, painful urination  Also denying vision changes, chest pain, shortness of breath, abdominal pain, nausea, vomiting, diarrhea, leg swelling, numbness, weakness, tingling.    Prior to Admission medications   Medication Sig Start Date End Date Taking? Authorizing Provider  naproxen  (NAPROSYN ) 500 MG tablet Take 1 tablet (500 mg total) by mouth 2 (two) times daily. 08/12/23  Yes Dayanna Pryce S, PA-C  amLODipine  (NORVASC ) 5 MG tablet Take 1 tablet (5 mg total) by mouth daily before breakfast. 07/23/19   Danton Reyes DASEN, MD  amLODipine  (NORVASC ) 5 MG tablet Take 1 tablet by mouth once daily. 11/22/20     amLODipine  (NORVASC ) 5 MG tablet Take 1 tablet (5 mg total) by mouth daily. 03/01/23     aspirin  EC 81 MG EC tablet Take 1 tablet (81 mg total) by mouth daily. Swallow whole. 07/24/19   Danton Reyes DASEN, MD  atorvastatin  (LIPITOR ) 80 MG tablet Take 1 tablet (80 mg total) by mouth daily. 07/24/19   Danton Reyes DASEN, MD  atorvastatin  (LIPITOR ) 80 MG tablet Take 1 tablet (80 mg total) by mouth every evening. 03/01/23     atorvastatin  (LIPITOR ) 80 MG tablet TAKE 1 TABLET BY MOUTH ONCE DAILY 09/04/19 09/03/20  Merilee, L.Addie, MD  clopidogrel  (PLAVIX ) 75 MG tablet Take 1 tablet (75 mg  total) by mouth daily. 07/24/19   Danton Reyes DASEN, MD  desonide (DESOWEN) 0.05 % ointment Apply 1 application topically 2 (two) times daily as needed (to groin).  04/29/19   [provider]  econazole nitrate 1 % cream Apply 1 application topically daily as needed (affected area).  Patient not taking: Reported on 07/22/2019 12/27/14   [provider]  etodolac (LODINE) 400 MG tablet Take 400 mg by mouth daily as needed (for pain).    [provider]  gabapentin  (NEURONTIN ) 300 MG capsule Take 1 capsule (300 mg total) by mouth 3 (three) times daily. Patient taking differently: Take 300 mg by mouth daily as needed (for neuropathy).  04/05/16   Eldonna Novel, MD  gabapentin  (NEURONTIN ) 300 MG capsule TAKE 1 CAPSULE BY MOUTH ONCE DAILY FOR NERVE PAIN 08/28/19 08/27/20  Merilee, L.Addie, MD    Allergies: Penicillins    Review of Systems  Musculoskeletal:  Positive for back pain.  All other systems reviewed and are negative.   Updated Vital Signs BP (!) 157/101 (BP Location: Right Arm)   Pulse (!) 102   Temp 98.8 F (37.1 C)   Resp 18   SpO2 98%   Physical Exam Vitals and nursing note reviewed.  Constitutional:      General: He is not in acute distress.    Appearance: Normal appearance. He is not ill-appearing or diaphoretic.  HENT:     Head: Normocephalic and atraumatic.  Eyes:  General: No scleral icterus.       Right eye: No discharge.        Left eye: No discharge.     Extraocular Movements: Extraocular movements intact.     Conjunctiva/sclera: Conjunctivae normal.  Cardiovascular:     Rate and Rhythm: Normal rate and regular rhythm.     Pulses: Normal pulses.     Heart sounds: Normal heart sounds. No murmur heard.    No friction rub. No gallop.  Pulmonary:     Effort: Pulmonary effort is normal. No respiratory distress.     Breath sounds: No stridor. No wheezing, rhonchi or rales.  Chest:     Chest wall: No tenderness.  Abdominal:      General: Abdomen is flat. There is no distension.     Palpations: Abdomen is soft.     Tenderness: There is no abdominal tenderness. There is no right CVA tenderness, left CVA tenderness, guarding or rebound.  Musculoskeletal:        General: Tenderness (Tenderness noted along the paraspinal muscles of the lumbar spine and gluteal muscles bilaterally, no midline tenderness, no step-off, no crepitus, no erythema) present. No swelling, deformity or signs of injury.     Cervical back: Normal range of motion. No rigidity.     Right lower leg: No edema.     Left lower leg: No edema.  Skin:    General: Skin is warm and dry.     Findings: No bruising, erythema or lesion.  Neurological:     General: No focal deficit present.     Mental Status: He is alert and oriented to person, place, and time. Mental status is at baseline.     Cranial Nerves: No cranial nerve deficit.     Sensory: No sensory deficit.     Motor: No weakness.     Coordination: Coordination normal.     Gait: Gait normal.     Comments: No facial asymmetry, no ataxia, no apraxia, no aphasia, no arm drift, normal coordination with finger-to-nose, normal sensation to both upper and lower extremities bilaterally, normal grip strength bilaterally, normal strength to both flexion and extension to both upper lower extremities 5+ bilaterally, no visual field deficits, no nystagmus.   Psychiatric:        Mood and Affect: Mood normal.     (all labs ordered are listed, but only abnormal results are displayed) Labs Reviewed - No data to display  EKG: None  Radiology: No results found.   Procedures   Medications Ordered in the ED - No data to display                               Medical Decision Making  This patient is a 68 year old male who presents to the ED for concern of chronic low back pain with patient noted to have had lumbar discectomy, patient states that feels similar to previous spinal stenosis.  No red flag  symptoms at this time.  On physical exam, patient is in no acute distress, afebrile, alert and orient x 4, speaking in full sentences, nontachypneic, nontachycardic.  Notably tender to bilateral lumbar muscles bilaterally with no midline tenderness.  Normal neuroexam.  Unremarkable exam otherwise.  Patient currently using ibuprofen, will have him switch to naproxen  as well as start using Tylenol  and over-the-counter medications as needed.  Told him to follow-up with PCP with patient said he has not had PCP, provided him  resources to schedule appointment as well as exercises to do for the sciatic pain he is experiencing today.  Low suspicion for cauda equina or epidural abscess or mass at this time.  Suspect likely muscle strain with no pain noted to midline spine or over sacrum.  Patient vital signs have remained stable throughout the course of patient's time in the ED. Low suspicion for any other emergent pathology at this time. I believe this patient is safe to be discharged. Provided strict return to ER precautions. Patient expressed agreement and understanding of plan. All questions were answered.  Differential diagnoses prior to evaluation: The emergent differential diagnosis includes, but is not limited to,  Fracture (acute/chronic), muscle strain, cauda equina, spinal stenosis, DDD, metastatic cancer, vertebral osteomyelitis, kidney stone, pyelonephritis, AAA, pancreatitis, bowel obstruction, meningitis.  . This is not an exhaustive differential.   Past Medical History / Co-morbidities / Social History: HTN, arthritis, alcohol  abuse, stroke, status post lumbar laminectomy/decompression microdiscectomy  Additional history: Chart reviewed. Pertinent results include: Noted to be status post lumbar laminectomy/decompression microdiscectomy    Medications: I ordered medication including naproxen .  I have reviewed the patients home medicines and have made adjustments as needed.  Critical  Interventions: None  Social Determinants of Health: Reports that he is not currently a PCP at this time, with also not having good follow-up with his neurosurgery  Disposition: After consideration of the diagnostic results and the patients response to treatment, I feel that the patient would benefit from discharge and as above.   emergency department workup does not suggest an emergent condition requiring admission or immediate intervention beyond what has been performed at this time. The plan is: Establish care with PCP, naproxen  and symptomatic management at home, return for new or worsening symptoms. The patient is safe for discharge and has been instructed to return immediately for worsening symptoms, change in symptoms or any other concerns.    Final diagnoses:  Chronic bilateral low back pain with bilateral sciatica    ED Discharge Orders          Ordered    naproxen  (NAPROSYN ) 500 MG tablet  2 times daily        08/12/23 [address], PA-C 08/12/23 1055    Patsey Lot, MD 08/12/23 1538

## 2023-08-12 NOTE — Discharge Instructions (Addendum)
 You were seen today for back pain.  Recommend that you continue to take Tylenol  as well as the anti-inflammatory I am prescribing today.  Additionally you can also use heating packs as well as lidocaine  patches or Voltaren gel both of which are over-the-counter.  Take Tylenol  (acetominophen)  650mg  every 4-6 hours, as needed for pain or fever. Do not take more than 4,000 mg in a 24-hour period. As this may cause liver damage. While this is rare, if you begin to develop yellowing of the skin or eyes, stop taking and return to ER immediately.  I have given you some list of exercises to do in this after visit summary, this should help with the sciatic pain that you are experiencing today.  Recommend continued follow-up with your primary care at this point for further outpatient management and monitoring.  As you may need to be referred to neurosurgery for reevaluation.  Return to the ED though if you can to have any new or worsening symptoms which would include numbness or tingling around your genitals, urinary or fecal incontinence, fever, painful urination, trauma to your back

## 2023-08-12 NOTE — ED Triage Notes (Addendum)
 Pt states his spinal stenosis is flaring up; c/o pain in bilateral legs and low back pain x several months; denies new injury; pt ambulatory with steady gait  Pt gives verbal consent for mse

## 2023-08-13 ENCOUNTER — Ambulatory Visit: Payer: Self-pay | Admitting: Family Medicine

## 2023-08-19 ENCOUNTER — Other Ambulatory Visit (HOSPITAL_COMMUNITY): Payer: Self-pay

## 2023-08-19 ENCOUNTER — Other Ambulatory Visit: Payer: Self-pay

## 2023-08-27 ENCOUNTER — Other Ambulatory Visit (HOSPITAL_COMMUNITY): Payer: Self-pay

## 2023-08-27 DIAGNOSIS — Z008 Encounter for other general examination: Secondary | ICD-10-CM | POA: Diagnosis not present

## 2023-08-27 DIAGNOSIS — I10 Essential (primary) hypertension: Secondary | ICD-10-CM | POA: Diagnosis not present

## 2023-08-27 MED ORDER — AMLODIPINE BESYLATE 5 MG PO TABS
5.0000 mg | ORAL_TABLET | Freq: Every day | ORAL | 0 refills | Status: DC
  Filled 2023-08-27: qty 90, 90d supply, fill #0

## 2023-08-27 MED ORDER — ATORVASTATIN CALCIUM 80 MG PO TABS
80.0000 mg | ORAL_TABLET | Freq: Every evening | ORAL | 0 refills | Status: DC
  Filled 2023-08-27: qty 90, 90d supply, fill #0

## 2023-09-16 ENCOUNTER — Other Ambulatory Visit (HOSPITAL_COMMUNITY): Payer: Self-pay

## 2023-09-16 ENCOUNTER — Other Ambulatory Visit: Payer: Self-pay

## 2023-09-16 DIAGNOSIS — R269 Unspecified abnormalities of gait and mobility: Secondary | ICD-10-CM | POA: Diagnosis not present

## 2023-09-16 DIAGNOSIS — G8929 Other chronic pain: Secondary | ICD-10-CM | POA: Diagnosis not present

## 2023-09-16 DIAGNOSIS — Z008 Encounter for other general examination: Secondary | ICD-10-CM | POA: Diagnosis not present

## 2023-09-16 DIAGNOSIS — F17210 Nicotine dependence, cigarettes, uncomplicated: Secondary | ICD-10-CM | POA: Diagnosis not present

## 2023-09-16 DIAGNOSIS — L732 Hidradenitis suppurativa: Secondary | ICD-10-CM | POA: Diagnosis not present

## 2023-09-16 MED ORDER — CLINDAMYCIN PHOSPHATE 1 % EX SOLN
1.0000 | Freq: Two times a day (BID) | CUTANEOUS | 0 refills | Status: AC
  Filled 2023-09-16 – 2023-09-21 (×2): qty 30, 15d supply, fill #0

## 2023-09-17 ENCOUNTER — Other Ambulatory Visit (HOSPITAL_COMMUNITY): Payer: Self-pay

## 2023-09-17 ENCOUNTER — Other Ambulatory Visit: Payer: Self-pay

## 2023-09-20 ENCOUNTER — Other Ambulatory Visit: Payer: Self-pay

## 2023-09-21 ENCOUNTER — Other Ambulatory Visit (HOSPITAL_COMMUNITY): Payer: Self-pay

## 2023-09-23 ENCOUNTER — Other Ambulatory Visit: Payer: Self-pay

## 2023-09-24 ENCOUNTER — Other Ambulatory Visit (HOSPITAL_COMMUNITY): Payer: Self-pay

## 2023-09-24 ENCOUNTER — Other Ambulatory Visit: Payer: Self-pay

## 2023-09-26 ENCOUNTER — Other Ambulatory Visit (HOSPITAL_COMMUNITY): Payer: Self-pay

## 2023-09-26 LAB — LAB REPORT - SCANNED
A1c: 7.1
EGFR: 66

## 2023-10-09 ENCOUNTER — Other Ambulatory Visit (HOSPITAL_COMMUNITY): Payer: Self-pay

## 2023-10-14 ENCOUNTER — Other Ambulatory Visit: Payer: Self-pay

## 2023-10-14 ENCOUNTER — Ambulatory Visit

## 2023-10-14 ENCOUNTER — Other Ambulatory Visit (HOSPITAL_COMMUNITY): Payer: Self-pay

## 2023-10-14 VITALS — BP 136/82 | HR 113 | Ht 72.0 in | Wt 179.6 lb

## 2023-10-14 DIAGNOSIS — M545 Low back pain, unspecified: Secondary | ICD-10-CM

## 2023-10-14 DIAGNOSIS — Z23 Encounter for immunization: Secondary | ICD-10-CM | POA: Diagnosis not present

## 2023-10-14 DIAGNOSIS — G8929 Other chronic pain: Secondary | ICD-10-CM | POA: Diagnosis not present

## 2023-10-14 DIAGNOSIS — Z720 Tobacco use: Secondary | ICD-10-CM

## 2023-10-14 DIAGNOSIS — R000 Tachycardia, unspecified: Secondary | ICD-10-CM

## 2023-10-14 DIAGNOSIS — Z8673 Personal history of transient ischemic attack (TIA), and cerebral infarction without residual deficits: Secondary | ICD-10-CM | POA: Diagnosis not present

## 2023-10-14 DIAGNOSIS — R634 Abnormal weight loss: Secondary | ICD-10-CM | POA: Diagnosis not present

## 2023-10-14 MED ORDER — ASPIRIN 81 MG PO TBEC
81.0000 mg | DELAYED_RELEASE_TABLET | Freq: Every day | ORAL | 3 refills | Status: AC
  Filled 2023-10-14: qty 90, 90d supply, fill #0
  Filled 2023-12-04: qty 90, 90d supply, fill #1

## 2023-10-14 MED ORDER — GABAPENTIN 300 MG PO CAPS
300.0000 mg | ORAL_CAPSULE | Freq: Two times a day (BID) | ORAL | 0 refills | Status: DC
  Filled 2023-10-14: qty 60, 30d supply, fill #0

## 2023-10-14 NOTE — Patient Instructions (Addendum)
 Larry Houston,   It was great meeting you in clinic today! You came in to establish care with a primary care physician (PCP) and talk about your back pain. I am restarting you on gabapentin  300 mg twice daily (in the morning and evening) for pain. Continue to use tylenol  and ibuprofen and icyhot. I am also giving you some back stretches and exercises to try.  I am getting some Xrays done of your back; you can get this done at [facility] at [address].  I am restarting aspirin  81 mg once daily given your history of stroke.  Given your unintentional weight loss, I am referring you for a colonoscopy and a CT scan of the chest. You will receive a phone call about getting these scheduled.  Please return in 2-3 weeks for your annual physical! Please bring your recent lab results to this visit (if you cannot upload them to MyChart). Please bring all your medications to your next office visit.  Thank you for allowing me to be a part of your care team! Alan Flies, MD [facility] [address] [place], [place], [place] [address] [phone]

## 2023-10-14 NOTE — Assessment & Plan Note (Signed)
-   Restarting ASA 81 mg daily

## 2023-10-14 NOTE — Progress Notes (Signed)
 SUBJECTIVE:   CHIEF COMPLAINT / HPI:   Larry Houston is a 68 y.o. male presenting to establish care. Last seen ~4 years ago by a PCP  Concerns include: Back pain Seen at the ED on 8/4 for chronic low back pain.  Treated with naproxen  and Tylenol .  No red flag symptoms at that time per ED note.  Had not recently followed up with neurosurgery. Not currently using naproxen .  Had lumbar discectomy in past. Recently, back and legs started bothering him again, since 2 months ago. Pain in lumbar region of back which radiates down his legs. Currently pain is a 9/10 in severity. Described as a stabbing sensation. Sometimes worse with sitting, better with lying flat. Using ibuprofen to help with pain, relieves it some, not a lot. Also using icyhot, which helps some. No back stretches, prior PT. No interest in PT.  Has not seen the neurosurgeon since his operation. No weakness in legs, loss of sensation in legs or groin, loss of bowel/bladder control.  Weight Loss Has had unintentional weight loss. From 220lb in 08/2019 to 179 lb today. Pt reports gradual weight loss. No decreased appetite. No fevers, night sweats. Does have chills. No blood in stools. Does report stomach pain, takes pepto bismol, stools darker from this. No N/V. No diarrhea, constipation. No pain, trouble with peeing. Does report peeing frequently. Current smoker; smokes a pack a week. First began smoking at 68yo. No Fhx of prostate cancer or other cancer. No personal hx of cancer. Pt reports he has an arrhythmia.  Healthcare Maintenance: to discuss further at yearly physical - Medicare annual wellness visit - Hep C screening - Colonoscopy - referral placed - Flu shot - getting today - COVID shot - getting today - Tdap - Pneumococcal vaccine - Shingrix  PMHx of: Hx of lumbar discectomy, HTN, hx CVA 2021  OBJECTIVE:   BP 136/82   Pulse (!) 113   Ht 6' (1.829 m)   Wt 179 lb 9.6 oz (81.5 kg)   SpO2 99%   BMI 24.36  kg/m    Cardiovascular: Tachycardic with regular rhythm, no murmurs/rubs/gallops. Respiratory: Normal work of breathing on room air. Clear to auscultation bilaterally; no wheezes, crackles. Abdomen: Bowel sounds present and normoactive bilaterally. Soft, nondistended, nontender. Back: No spinal point tenderness to palpation.  Some pain to palpation in lumbar paraspinal muscle area, tight to palpation. EXT: No bilateral lower extremity edema.  Normal sensation bilaterally of lower extremities.  Strength 5/5 in bilateral lower extremities.  EKG: Sinus tachycardia, no arrhythmia.  ASSESSMENT/PLAN:   Assessment & Plan Chronic low back pain, unspecified back pain laterality, unspecified whether sciatica present History of lumbar discectomy in 2017 per chart review (patient states this was 4 years ago).  Recent increase in back pain over past 2 months.  Given gradual weight loss with new worsening back pain, concern for potential malignancy.  Will get imaging of spine to assess further.  Also could be related to arthritis of spine, and will treat with medications and exercises; patient denies wanting physical therapy at this time. - Continue using ibuprofen as needed - Start gabapentin  300 mg 2 times daily - Xray lumbar and thoracic spine ordered - Back exercises/stretches handout provided Unintentional weight loss 40 pound weight loss since 2021.  Patient does have risk factors for cancer including history of tobacco use.  Has also never had a colonoscopy.  Will screen. - Colonoscopy referral placed - CT Chest ordered History of CVA (cerebrovascular accident) -  Restarting ASA 81 mg daily Encounter for immunization - Flu and COVID vaccines given today    Follow-up in 2-3 weeks for annual exam.   Patient reports he has gotten labs done recently at [facility]; unable to access these today in clinic.  Patient will work on accessing these labs and either uploading them to MyChart (MyChart  activation link sent via email to patient today) or bringing them to next office visit.  If unable to access these labs, will plan to get lab work done in house.  Alan Flies, MD [facility]

## 2023-10-15 ENCOUNTER — Other Ambulatory Visit (HOSPITAL_COMMUNITY): Payer: Self-pay

## 2023-10-16 ENCOUNTER — Other Ambulatory Visit: Payer: Self-pay

## 2023-10-16 ENCOUNTER — Ambulatory Visit
Admission: RE | Admit: 2023-10-16 | Discharge: 2023-10-16 | Disposition: A | Discharge status: Home / Self Care | Source: Ambulatory Visit | Attending: Family Medicine | Admitting: Family Medicine

## 2023-10-16 DIAGNOSIS — Z23 Encounter for immunization: Secondary | ICD-10-CM

## 2023-10-16 DIAGNOSIS — Z720 Tobacco use: Secondary | ICD-10-CM

## 2023-10-16 DIAGNOSIS — R634 Abnormal weight loss: Secondary | ICD-10-CM

## 2023-10-16 DIAGNOSIS — G8929 Other chronic pain: Secondary | ICD-10-CM

## 2023-10-16 DIAGNOSIS — R000 Tachycardia, unspecified: Secondary | ICD-10-CM

## 2023-10-16 DIAGNOSIS — Z8673 Personal history of transient ischemic attack (TIA), and cerebral infarction without residual deficits: Secondary | ICD-10-CM

## 2023-10-17 ENCOUNTER — Ambulatory Visit (HOSPITAL_COMMUNITY)

## 2023-10-18 ENCOUNTER — Ambulatory Visit: Payer: Self-pay

## 2023-10-18 ENCOUNTER — Telehealth: Payer: Self-pay

## 2023-10-18 DIAGNOSIS — D649 Anemia, unspecified: Secondary | ICD-10-CM | POA: Insufficient documentation

## 2023-10-18 DIAGNOSIS — E119 Type 2 diabetes mellitus without complications: Secondary | ICD-10-CM | POA: Insufficient documentation

## 2023-10-18 NOTE — Progress Notes (Signed)
 Attempted to call patient x 2 in regards to scanned document with recent labs (specifically, low hemoglobin concerning for anemia, elevated A1c concerning for type 2 diabetes, elevated liver enzymes and alk phos).  Left patient message to call back clinic so that these results can be emphasized to patient.  He will need an appointment to discuss these results in more detail.  Admin team, can you help schedule this patient for an appointment to discuss lab work?

## 2023-10-18 NOTE — Telephone Encounter (Signed)
 Patient calls nurse line reporting he missed his Radiology apt on 10/9.  He reports he does not want to have this exam done at [facility].  Phone number given to patient to call and reschedule apt to preferred location.

## 2023-10-22 ENCOUNTER — Inpatient Hospital Stay (HOSPITAL_COMMUNITY)
Admission: EM | Admit: 2023-10-22 | Discharge: 2023-10-27 | DRG: 717 | Disposition: A | Discharge status: Home / Self Care | Attending: Family Medicine | Admitting: Family Medicine

## 2023-10-22 ENCOUNTER — Emergency Department (HOSPITAL_COMMUNITY)

## 2023-10-22 ENCOUNTER — Other Ambulatory Visit: Payer: Self-pay

## 2023-10-22 ENCOUNTER — Encounter (HOSPITAL_COMMUNITY): Payer: Self-pay

## 2023-10-22 ENCOUNTER — Ambulatory Visit: Payer: Self-pay

## 2023-10-22 ENCOUNTER — Ambulatory Visit (HOSPITAL_COMMUNITY): Admission: EM | Admit: 2023-10-22 | Discharge: 2023-10-22 | Disposition: A | Discharge status: Home / Self Care

## 2023-10-22 DIAGNOSIS — N201 Calculus of ureter: Secondary | ICD-10-CM

## 2023-10-22 DIAGNOSIS — G8929 Other chronic pain: Secondary | ICD-10-CM | POA: Diagnosis present

## 2023-10-22 DIAGNOSIS — L03315 Cellulitis of perineum: Secondary | ICD-10-CM | POA: Diagnosis present

## 2023-10-22 DIAGNOSIS — E876 Hypokalemia: Secondary | ICD-10-CM | POA: Diagnosis present

## 2023-10-22 DIAGNOSIS — R7989 Other specified abnormal findings of blood chemistry: Secondary | ICD-10-CM | POA: Diagnosis present

## 2023-10-22 DIAGNOSIS — N492 Inflammatory disorders of scrotum: Secondary | ICD-10-CM | POA: Diagnosis not present

## 2023-10-22 DIAGNOSIS — C7801 Secondary malignant neoplasm of right lung: Secondary | ICD-10-CM | POA: Diagnosis present

## 2023-10-22 DIAGNOSIS — D649 Anemia, unspecified: Secondary | ICD-10-CM | POA: Diagnosis present

## 2023-10-22 DIAGNOSIS — Z8249 Family history of ischemic heart disease and other diseases of the circulatory system: Secondary | ICD-10-CM

## 2023-10-22 DIAGNOSIS — R7401 Elevation of levels of liver transaminase levels: Secondary | ICD-10-CM | POA: Diagnosis present

## 2023-10-22 DIAGNOSIS — E860 Dehydration: Secondary | ICD-10-CM | POA: Diagnosis present

## 2023-10-22 DIAGNOSIS — C61 Malignant neoplasm of prostate: Secondary | ICD-10-CM | POA: Diagnosis present

## 2023-10-22 DIAGNOSIS — Z789 Other specified health status: Secondary | ICD-10-CM

## 2023-10-22 DIAGNOSIS — N132 Hydronephrosis with renal and ureteral calculous obstruction: Secondary | ICD-10-CM | POA: Diagnosis present

## 2023-10-22 DIAGNOSIS — F17210 Nicotine dependence, cigarettes, uncomplicated: Secondary | ICD-10-CM | POA: Diagnosis present

## 2023-10-22 DIAGNOSIS — C7802 Secondary malignant neoplasm of left lung: Secondary | ICD-10-CM | POA: Diagnosis present

## 2023-10-22 DIAGNOSIS — F1090 Alcohol use, unspecified, uncomplicated: Secondary | ICD-10-CM | POA: Diagnosis present

## 2023-10-22 DIAGNOSIS — N5082 Scrotal pain: Secondary | ICD-10-CM | POA: Diagnosis not present

## 2023-10-22 DIAGNOSIS — R310 Gross hematuria: Secondary | ICD-10-CM | POA: Diagnosis not present

## 2023-10-22 DIAGNOSIS — Z8673 Personal history of transient ischemic attack (TIA), and cerebral infarction without residual deficits: Secondary | ICD-10-CM

## 2023-10-22 DIAGNOSIS — E43 Unspecified severe protein-calorie malnutrition: Secondary | ICD-10-CM | POA: Diagnosis present

## 2023-10-22 DIAGNOSIS — Z833 Family history of diabetes mellitus: Secondary | ICD-10-CM

## 2023-10-22 DIAGNOSIS — N200 Calculus of kidney: Secondary | ICD-10-CM

## 2023-10-22 DIAGNOSIS — T8384XA Pain from genitourinary prosthetic devices, implants and grafts, initial encounter: Secondary | ICD-10-CM | POA: Diagnosis not present

## 2023-10-22 DIAGNOSIS — Z6823 Body mass index (BMI) 23.0-23.9, adult: Secondary | ICD-10-CM

## 2023-10-22 DIAGNOSIS — Z66 Do not resuscitate: Secondary | ICD-10-CM | POA: Diagnosis present

## 2023-10-22 DIAGNOSIS — L732 Hidradenitis suppurativa: Secondary | ICD-10-CM | POA: Diagnosis present

## 2023-10-22 DIAGNOSIS — Z79899 Other long term (current) drug therapy: Secondary | ICD-10-CM

## 2023-10-22 DIAGNOSIS — C50922 Malignant neoplasm of unspecified site of left male breast: Secondary | ICD-10-CM | POA: Diagnosis present

## 2023-10-22 DIAGNOSIS — I10 Essential (primary) hypertension: Secondary | ICD-10-CM | POA: Diagnosis present

## 2023-10-22 DIAGNOSIS — M1990 Unspecified osteoarthritis, unspecified site: Secondary | ICD-10-CM | POA: Diagnosis present

## 2023-10-22 DIAGNOSIS — Y833 Surgical operation with formation of external stoma as the cause of abnormal reaction of the patient, or of later complication, without mention of misadventure at the time of the procedure: Secondary | ICD-10-CM | POA: Diagnosis not present

## 2023-10-22 DIAGNOSIS — D696 Thrombocytopenia, unspecified: Secondary | ICD-10-CM | POA: Diagnosis present

## 2023-10-22 DIAGNOSIS — Z7982 Long term (current) use of aspirin: Secondary | ICD-10-CM

## 2023-10-22 DIAGNOSIS — R634 Abnormal weight loss: Secondary | ICD-10-CM

## 2023-10-22 DIAGNOSIS — L02215 Cutaneous abscess of perineum: Secondary | ICD-10-CM | POA: Diagnosis present

## 2023-10-22 DIAGNOSIS — E872 Acidosis, unspecified: Secondary | ICD-10-CM | POA: Diagnosis present

## 2023-10-22 DIAGNOSIS — N1330 Unspecified hydronephrosis: Secondary | ICD-10-CM | POA: Insufficient documentation

## 2023-10-22 DIAGNOSIS — R937 Abnormal findings on diagnostic imaging of other parts of musculoskeletal system: Secondary | ICD-10-CM

## 2023-10-22 DIAGNOSIS — D638 Anemia in other chronic diseases classified elsewhere: Secondary | ICD-10-CM | POA: Diagnosis present

## 2023-10-22 DIAGNOSIS — E119 Type 2 diabetes mellitus without complications: Secondary | ICD-10-CM | POA: Diagnosis present

## 2023-10-22 DIAGNOSIS — C7951 Secondary malignant neoplasm of bone: Secondary | ICD-10-CM | POA: Diagnosis present

## 2023-10-22 DIAGNOSIS — C50919 Malignant neoplasm of unspecified site of unspecified female breast: Secondary | ICD-10-CM | POA: Insufficient documentation

## 2023-10-22 DIAGNOSIS — Z880 Allergy status to penicillin: Secondary | ICD-10-CM

## 2023-10-22 DIAGNOSIS — C799 Secondary malignant neoplasm of unspecified site: Secondary | ICD-10-CM | POA: Insufficient documentation

## 2023-10-22 DIAGNOSIS — N400 Enlarged prostate without lower urinary tract symptoms: Secondary | ICD-10-CM | POA: Diagnosis present

## 2023-10-22 DIAGNOSIS — N6320 Unspecified lump in the left breast, unspecified quadrant: Secondary | ICD-10-CM

## 2023-10-22 LAB — I-STAT CG4 LACTIC ACID, ED
Lactic Acid, Venous: 2.4 mmol/L (ref 0.5–1.9)
Lactic Acid, Venous: 2.9 mmol/L (ref 0.5–1.9)

## 2023-10-22 LAB — CBC WITH DIFFERENTIAL/PLATELET
Abs Immature Granulocytes: 0.09 K/uL — ABNORMAL HIGH (ref 0.00–0.07)
Basophils Absolute: 0 K/uL (ref 0.0–0.1)
Basophils Relative: 0 %
Eosinophils Absolute: 0 K/uL (ref 0.0–0.5)
Eosinophils Relative: 0 %
HCT: 25.3 % — ABNORMAL LOW (ref 39.0–52.0)
Hemoglobin: 8.3 g/dL — ABNORMAL LOW (ref 13.0–17.0)
Immature Granulocytes: 1 %
Lymphocytes Relative: 32 %
Lymphs Abs: 2.6 K/uL (ref 0.7–4.0)
MCH: 31 pg (ref 26.0–34.0)
MCHC: 32.8 g/dL (ref 30.0–36.0)
MCV: 94.4 fL (ref 80.0–100.0)
Monocytes Absolute: 0.4 K/uL (ref 0.1–1.0)
Monocytes Relative: 5 %
Neutro Abs: 4.9 K/uL (ref 1.7–7.7)
Neutrophils Relative %: 62 %
Platelets: 113 K/uL — ABNORMAL LOW (ref 150–400)
RBC: 2.68 MIL/uL — ABNORMAL LOW (ref 4.22–5.81)
RDW: 21.2 % — ABNORMAL HIGH (ref 11.5–15.5)
Smear Review: NORMAL
WBC: 8 K/uL (ref 4.0–10.5)
nRBC: 2.3 % — ABNORMAL HIGH (ref 0.0–0.2)

## 2023-10-22 LAB — COMPREHENSIVE METABOLIC PANEL WITH GFR
ALT: 141 U/L — ABNORMAL HIGH (ref 0–44)
AST: 227 U/L — ABNORMAL HIGH (ref 15–41)
Albumin: 2.8 g/dL — ABNORMAL LOW (ref 3.5–5.0)
Alkaline Phosphatase: 491 U/L — ABNORMAL HIGH (ref 38–126)
Anion gap: 11 (ref 5–15)
BUN: 11 mg/dL (ref 8–23)
CO2: 25 mmol/L (ref 22–32)
Calcium: 8.7 mg/dL — ABNORMAL LOW (ref 8.9–10.3)
Chloride: 104 mmol/L (ref 98–111)
Creatinine, Ser: 1.24 mg/dL (ref 0.61–1.24)
GFR, Estimated: >60 mL/min (ref >60)
Glucose, Bld: 124 mg/dL — ABNORMAL HIGH (ref 70–99)
Potassium: 3.6 mmol/L (ref 3.5–5.1)
Sodium: 140 mmol/L (ref 135–145)
Total Bilirubin: 1.4 mg/dL — ABNORMAL HIGH (ref 0.0–1.2)
Total Protein: 6.9 g/dL (ref 6.5–8.1)

## 2023-10-22 MED ORDER — VANCOMYCIN HCL IN DEXTROSE 1-5 GM/200ML-% IV SOLN: 1000.0000 mg | Freq: Once | INTRAVENOUS | Status: DC

## 2023-10-22 MED ORDER — LACTATED RINGERS IV BOLUS (SEPSIS)
500.0000 mL | Freq: Once | INTRAVENOUS | Status: AC
  Administered 2023-10-23: 500 mL via INTRAVENOUS

## 2023-10-22 MED ORDER — VANCOMYCIN HCL 1500 MG/300ML IV SOLN
1500.0000 mg | Freq: Once | INTRAVENOUS | Status: AC
  Administered 2023-10-22: 1500 mg via INTRAVENOUS
  Filled 2023-10-22: qty 300

## 2023-10-22 MED ORDER — LACTATED RINGERS IV SOLN: INTRAVENOUS | Status: DC

## 2023-10-22 MED ORDER — LACTATED RINGERS IV BOLUS (SEPSIS)
1000.0000 mL | Freq: Once | INTRAVENOUS | Status: AC
  Administered 2023-10-22: 1000 mL via INTRAVENOUS

## 2023-10-22 MED ORDER — IOHEXOL 350 MG/ML SOLN
75.0000 mL | Freq: Once | INTRAVENOUS | Status: AC | PRN
  Administered 2023-10-22: 75 mL via INTRAVENOUS

## 2023-10-22 NOTE — Discharge Instructions (Addendum)
 Large area of cellulitic changes within the perineum.  Concerning presentation given the extension towards the scrotum.  Given this we feel that more advanced imaging would be indicated before pursuing incision and drainage.  Recommend evaluation in the emergency room more or more advanced imaging can be performed as well as blood work.

## 2023-10-22 NOTE — ED Notes (Signed)
 [facility] truck are unavailbile. EMS notified for transport. Report given to Honnie, RN MC CN.

## 2023-10-22 NOTE — Progress Notes (Signed)
 Attempted to call patient x2 regarding results of spine Xrays. Lumbar spine xray showed degenerative disease. However, thoracic Xray had equivocal findings and recommendation by radiologist for further evaluation with CT. Order placed for this and left voicemail for patient to call office for results.  Red team, could you please help get a CT scan ordered for this patient?

## 2023-10-22 NOTE — ED Notes (Signed)
 Patient lost IV access during bolus administration. Will obtain new access and resume bolus

## 2023-10-22 NOTE — ED Notes (Signed)
 Returned from CT.

## 2023-10-22 NOTE — ED Notes (Signed)
 Patient is being discharged from the Urgent Care and sent to the Emergency Department via EMS . Per [place], [place], patient is in need of higher level of care due to tachycardia and cellulitis to scrotum. Patient is aware and verbalizes understanding of plan of care.  Vitals:   10/22/23 1807 10/22/23 1810  BP: (!) 167/71 (!) 146/81  Pulse: (!) 119 (!) 115  Resp: 18   Temp: 98.2 F (36.8 C)   SpO2: 95% 96%

## 2023-10-22 NOTE — ED Notes (Signed)
 Patient transported to CT

## 2023-10-22 NOTE — Sepsis Progress Note (Signed)
 Elink monitoring for the code sepsis protocol.

## 2023-10-22 NOTE — ED Provider Notes (Signed)
 [facility]    CSN: [id number] Arrival date & time: 10/22/23  1708      History   Chief Complaint Chief Complaint  Patient presents with   Abscess    HPI Larry Houston is a 68 y.o. male.   68 year old male presents urgent care with complaints of a painful swollen area in his buttocks region.  He reports this started about 2 days ago but has gotten much worse in the last 24 hours.  He denies any fevers or chills.  He denies being diabetic.  He has had something similar in the past but it was closer to the rectum.  He reports that the area is so tender that he is having a hard time walking or sitting.   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   Venous malformation 09/13/2016   Sepsis (HCC) 02/18/2015   S/P lumbar laminectomy 01/26/2015   Essential hypertension 04/04/2014   Alcohol  use (HCC) 04/04/2014   Thrombocytopenia (HCC) 04/04/2014    Past Surgical History:  Procedure Laterality Date   IR RADIOLOGIST EVAL & MGMT  04/05/2016   IR RADIOLOGIST EVAL & MGMT  10/17/2016   IR RADIOLOGIST EVAL & MGMT  12/05/2016   IR SCLEROTHERAPY OF A FLUID COLLECTION Right 09/13/2016   Venous malformation [Q27.9]   RLE   IR SCLEROTHERAPY OF A FLUID COLLECTION  09/13/2016   IR US  GUIDE VASC ACCESS RIGHT  09/13/2016   LUMBAR LAMINECTOMY/DECOMPRESSION MICRODISCECTOMY Bilateral 01/26/2015   Procedure: Laminectomy and Foraminotomy - L4-L5 - bilateral;  Surgeon: Alm GORMAN Molt, MD;  Location: [facility];  Service: Neurosurgery;  Laterality: Bilateral;  Laminectomy and Foraminotomy - L4-L5 - bilateral   NASAL ENDOSCOPY WITH EPISTAXIS CONTROL N/A 04/04/2014   Procedure: NASAL ENDOSCOPY WITH EPISTAXIS CONTROL;  Surgeon: Merilee Kraft, MD;  Location: [facility] OR;  Service: ENT;  Laterality: N/A;   NECK SURGERY  1992   posterior fusion- cervical    RADIOLOGY WITH ANESTHESIA N/A 09/13/2016   Procedure: percutaneous sclerotherapy with dehydrated ethanol;  Surgeon: Karalee Beat, MD;  Location: [facility] OR;  Service: Radiology;  Laterality: N/A;       Home Medications    Prior to Admission medications   Medication Sig Start Date End Date Taking? Authorizing Provider  amLODipine  (NORVASC ) 5 MG tablet Take 1 tablet (5 mg total) by mouth daily before breakfast. 07/23/19   Danton Reyes DASEN, MD  aspirin  EC 81 MG tablet Take 1 tablet (81 mg total) by mouth daily. Swallow whole. 10/14/23   Larraine Palma, MD  atorvastatin  (LIPITOR ) 80 MG tablet Take 1 tablet (80 mg total) by mouth daily. 07/24/19   Danton Reyes DASEN, MD  clindamycin  (CLEOCIN  T) 1 % external solution Apply 1 Application topically 2 (two) times daily. 09/16/23     clopidogrel  (PLAVIX ) 75 MG tablet Take 1 tablet (75 mg total) by mouth daily. Patient not taking: Reported on 10/14/2023 07/24/19   Danton Reyes  T, MD  gabapentin  (NEURONTIN ) 300 MG capsule Take 1 capsule (300 mg total) by mouth 2 (two) times daily. 10/14/23   Larraine Palma, MD  naproxen  (NAPROSYN ) 500 MG tablet Take 1 tablet (500 mg total) by mouth 2 (two) times daily. Patient not taking: Reported on 10/14/2023 08/12/23   Beola Terrall RAMAN, PA-C    Family History Family History  Problem Relation Age of Onset   Diabetes Mother    Hypertension Mother    Anemia Mother        Died of aplastic anemia   Lung disease Father    Heart attack Brother    Heart disease Sister        One sister had heart arrest    Social History Social History   Tobacco Use   Smoking status:  Former    Current packs/day: 0.00    Types: Cigarettes    Start date: 01/14/1999    Quit date: 01/13/2006    Years since quitting: 17.7   Smokeless tobacco: Never  Vaping Use   Vaping status: Never Used  Substance Use Topics   Alcohol  use: Yes    Comment: occasional    Drug use: No     Allergies   Penicillins   Review of Systems Review of Systems  Constitutional:  Negative for chills and fever.  HENT:  Negative for ear pain and sore throat.   Eyes:  Negative for pain and visual disturbance.  Respiratory:  Negative for cough and shortness of breath.   Cardiovascular:  Negative for chest pain and palpitations.  Gastrointestinal:  Negative for abdominal pain and vomiting.  Genitourinary:  Negative for dysuria and hematuria.  Musculoskeletal:  Negative for arthralgias and back pain.  Skin:  Positive for color change. Negative for rash.       Pain in the perineum   Neurological:  Negative for seizures and syncope.  All other systems reviewed and are negative.    Physical Exam Triage Vital Signs ED Triage Vitals [10/22/23 1807]  Encounter Vitals Group     BP (!) 167/71     Girls Systolic BP Percentile      Girls Diastolic BP Percentile      Boys Systolic BP Percentile      Boys Diastolic BP Percentile      Pulse Rate (!) 119     Resp 18     Temp 98.2 F (36.8 C)     Temp Source Oral     SpO2 95 %     Weight      Height      Head Circumference      Peak Flow      Pain Score 10     Pain Loc      Pain Education      Exclude from Growth Chart    No data found.  Updated Vital Signs BP (!) 146/81 (BP Location: Right Arm)   Pulse (!) 115   Temp 98.2 F (36.8 C) (Oral)   Resp 18   SpO2 96%   Visual Acuity Right Eye Distance:   Left Eye Distance:   Bilateral Distance:    Right Eye Near:   Left Eye Near:    Bilateral Near:     Physical Exam Vitals and nursing note reviewed.  Constitutional:      General: He is not in acute distress.    Appearance: He is  well-developed.  HENT:     Head: Normocephalic and atraumatic.  Eyes:  Conjunctiva/sclera: Conjunctivae normal.  Cardiovascular:     Rate and Rhythm: Normal rate and regular rhythm.     Heart sounds: No murmur heard. Pulmonary:     Effort: Pulmonary effort is normal. No respiratory distress.     Breath sounds: Normal breath sounds.  Abdominal:     Palpations: Abdomen is soft.     Tenderness: There is no abdominal tenderness.  Genitourinary:  Musculoskeletal:        General: No swelling.     Cervical back: Neck supple.  Skin:    General: Skin is warm and dry.     Capillary Refill: Capillary refill takes less than 2 seconds.  Neurological:     Mental Status: He is alert.  Psychiatric:        Mood and Affect: Mood normal.      UC Treatments / Results  Labs (all labs ordered are listed, but only abnormal results are displayed) Labs Reviewed - No data to display  EKG   Radiology No results found.  Procedures Procedures (including critical care time)  Medications Ordered in UC Medications - No data to display  Initial Impression / Assessment and Plan / UC Course  I have reviewed the triage vital signs and the nursing notes.  Pertinent labs & imaging results that were available during my care of the patient were reviewed by me and considered in my medical decision making (see chart for details).     Cellulitis of perineum  Large area of cellulitic changes within the perineum.  Concerning presentation given the extension towards the scrotum.  Given this we feel that more advanced imaging would be indicated before pursuing incision and drainage.  Recommend evaluation in the emergency room more or more advanced imaging can be performed as well as blood work.  Final Clinical Impressions(s) / UC Diagnoses   Final diagnoses:  Cellulitis of perineum     Discharge Instructions      Large area of cellulitic changes within the perineum.  Concerning presentation  given the extension towards the scrotum.  Given this we feel that more advanced imaging would be indicated before pursuing incision and drainage.  Recommend evaluation in the emergency room more or more advanced imaging can be performed as well as blood work.     ED Prescriptions   None    PDMP not reviewed this encounter.   Teresa Almarie DELENA DEVONNA 10/22/23 1835

## 2023-10-22 NOTE — ED Triage Notes (Signed)
 Pt c/o abscess to lt buttocks/scrotum area x2 days. Took aleve  with no relief. Denies drainge.

## 2023-10-22 NOTE — ED Triage Notes (Signed)
 Arrives POV from UC after they encouraged EMS transport.   Reports scrotal / groin abscess x 2 days.   Denies fevers.   Tachycardic ~150s in waiting area upon check in.

## 2023-10-22 NOTE — ED Notes (Signed)
 Labwork sent down at this time, 1 set of blood cx sent as well

## 2023-10-22 NOTE — ED Provider Notes (Signed)
 [place] EM[facility]GENCY DEPARTMENT AT [facility] Provider Note   CSN: [id number] Arrival date & time: 10/22/23  1933     Patient presents with: Abscess   Larry Houston is a 68 y.o. male.   68 year old male presenting from urgent care with concern for scrotal abscess.  Patient endorses symptoms that began approximately 3 days ago with what he believed to be the beginning of an abscess forming, reports worsening symptoms causing pain with sitting certain way which caused him to present to UC today.  He was seen and evaluated at urgent care, they were concerned given the extension of the abscess toward the scrotum and that further imaging would be necessary prior to any incision and drainage, thus sending him to the emergency department.  He denies fever, reports that he was recently diagnosed with type 2 diabetes but has not been started on any medications for control of this yet.    Abscess      Prior to Admission medications   Medication Sig Start Date End Date Taking? Authorizing Provider  amLODipine  (NORVASC ) 5 MG tablet Take 1 tablet (5 mg total) by mouth daily before breakfast. 07/23/19   Danton Reyes DASEN, MD  aspirin  EC 81 MG tablet Take 1 tablet (81 mg total) by mouth daily. Swallow whole. 10/14/23   Larraine Palma, MD  atorvastatin  (LIPITOR ) 80 MG tablet Take 1 tablet (80 mg total) by mouth daily. 07/24/19   Danton Reyes DASEN, MD  clindamycin  (CLEOCIN  T) 1 % external solution Apply 1 Application topically 2 (two) times daily. 09/16/23     clopidogrel  (PLAVIX ) 75 MG tablet Take 1 tablet (75 mg total) by mouth daily. Patient not taking: Reported on 10/14/2023 07/24/19   Danton Reyes DASEN, MD  gabapentin  (NEURONTIN ) 300 MG capsule Take 1 capsule (300 mg total) by mouth 2 (two) times daily. 10/14/23   Larraine Palma, MD  naproxen  (NAPROSYN ) 500 MG tablet Take 1 tablet (500 mg total) by mouth 2 (two) times daily. Patient not taking: Reported on 10/14/2023 08/12/23   Beola Terrall RAMAN, PA-C    Allergies: Penicillins    Review of Systems  Updated Vital Signs  Vitals:   10/22/23 2230 10/22/23 2245 10/22/23 2300 10/22/23 2315  BP: (!) 147/79 (!) 143/73 138/78 (!) 147/74  Pulse: 97 91 91 89  Resp:  19 (!) 21 (!) 22  Temp:      SpO2: 98% 98% 98% 98%  Weight:      Height:         Physical Exam Vitals and nursing note reviewed.  Constitutional:      Appearance: He is not ill-appearing or toxic-appearing.  HENT:     Head: Normocephalic.  Eyes:     Extraocular Movements: Extraocular movements intact.  Cardiovascular:     Rate and Rhythm: Regular rhythm. Tachycardia present.  Pulmonary:     Effort: Pulmonary effort is normal.     Breath sounds: Normal breath sounds.  Abdominal:     Palpations: Abdomen is soft.     Tenderness: There is no abdominal tenderness. There is no guarding.  Genitourinary:     Comments: GU exam performed with nurse Braden at the bedside as chaperone   Tenderness and fluctuance present to base of scrotum, area is actively draining a bloody/purulent/foul-smelling discharge. There does not appear to be any extension towards the perianal region.   Musculoskeletal:     Cervical back: Normal range of motion.     Comments: Moves all extremities spontaneously  without difficulty  Skin:    General: Skin is warm and dry.  Neurological:     Mental Status: He is alert and oriented to person, place, and time.     (all labs ordered are listed, but only abnormal results are displayed) Labs Reviewed  CBC WITH DIFFERENTIAL/PLATELET - Abnormal; Notable for the following components:      Result Value   RBC 2.68 (*)    Hemoglobin 8.3 (*)    HCT 25.3 (*)    RDW 21.2 (*)    Platelets 113 (*)    nRBC 2.3 (*)    Abs Immature Granulocytes 0.09 (*)    All other components within normal limits  COMPREHENSIVE METABOLIC PANEL WITH GFR - Abnormal; Notable for the following components:   Glucose, Bld 124 (*)    Calcium  8.7 (*)    Albumin  2.8 (*)    AST 227 (*)    ALT 141 (*)    Alkaline Phosphatase 491 (*)    Total Bilirubin 1.4 (*)    All other components within normal limits  I-STAT CG4 LACTIC ACID, ED - Abnormal; Notable for the following components:   Lactic Acid, Venous 2.4 (*)    All other components within normal limits  I-STAT CG4 LACTIC ACID, ED - Abnormal; Notable for the following components:   Lactic Acid, Venous 2.9 (*)    All other components within normal limits  CULTURE, BLOOD (ROUTINE X 2)  CULTURE, BLOOD (ROUTINE X 2)  RAPID URINE DRUG SCREEN, HOSP PERFORMED  HEPATITIS PANEL, ACUTE  I-STAT CHEM 8, ED    EKG: None  Radiology: CT ABDOMEN PELVIS W CONTRAST Result Date: 10/22/2023 EXAM: CT ABDOMEN AND PELVIS WITH CONTRAST 10/22/2023 11:38:08 PM TECHNIQUE: CT of the abdomen and pelvis was performed with the administration of 75 mL of intravenous contrast (iohexol  (OMNIPAQUE ) 350 MG/ML injection 75 mL IOHEXOL  350 MG/ML SOLN). Multiplanar reformatted images are provided for review. Automated exposure control, iterative reconstruction, and/or weight-based adjustment of the mA/kV was utilized to reduce the radiation dose to as low as reasonably achievable. COMPARISON: None available. CLINICAL HISTORY: Concern for scrotal abscess, elevated LFT's. Pt complains of scrotal perineal abscess seen at urgent care sent in for further evaluation. FINDINGS: LOWER CHEST: Subpleural micronodularity in the lungs bilaterally, including a dominant 4 mm lingular nodule (image 1), indeterminate. Trace left pleural effusion. LIVER: Subcentimeter cyst in the left hepatic lobe (image 18), benign. GALLBLADDER AND BILE DUCTS: Gallbladder is unremarkable. No biliary ductal dilatation. SPLEEN: No acute abnormality. PANCREAS: No acute abnormality. ADRENAL GLANDS: No acute abnormality. KIDNEYS, URETERS AND BLADDER: Moderate right hydronephrosis with associated 12 mm proximal right ureteral calculus at the L3-L4 level. No stones in the left  kidney or left ureter. No perinephric or periureteral stranding. Mildly thick-walled bladder, although underdistended. GI AND BOWEL: Stomach demonstrates no acute abnormality. Normal appendix (image 52). There is no bowel obstruction. PERITONEUM AND RETROPERITONEUM: No ascites. No free air. VASCULATURE: Aorta is normal in caliber. Atherosclerotic calcifications of the abdominal aorta and branch vessels, although patent. LYMPH NODES: No lymphadenopathy. REPRODUCTIVE ORGANS: Prostatomegaly. Mild scrotal wall thickening/edema. No discrete abscess on CT. BONES AND SOFT TISSUES: Tiny fat-containing bilateral inguinal hernias. Diffuse sclerotic metastases throughout the visualized axial and appendicular skeleton. IMPRESSION: 1. Moderate right hydronephrosis with associated 12 mm proximal right ureteral calculus at the L3-L4 level. 2. Mild scrotal wall thickening/edema without discrete abscess on CT. 3. Diffuse sclerotic metastases throughout the visualized axial and appendicular skeleton. Correlate for history of malignancy, presumably  prostate CA. 4. Subpleural micronodularity measuring up to 4 mm in the lingula. In the setting of suspected primary malignancy, Fleischner Society guidelines do not apply. Consider follow-up CT chest as clinically warranted. Electronically signed by: Pinkie Pebbles MD 10/22/2023 11:52 PM EDT RP Workstation: [id number]     Procedures   Medications Ordered in the ED  lactated ringers  infusion (has no administration in time range)  lactated ringers  bolus 1,000 mL (0 mLs Intravenous Stopped 10/22/23 2330)    And  lactated ringers  bolus 1,000 mL (0 mLs Intravenous Paused 10/22/23 2337)    And  lactated ringers  bolus 500 mL (has no administration in time range)  vancomycin  (VANCOREADY) IVPB 1500 mg/300 mL (0 mg Intravenous Paused 10/22/23 2325)  iohexol  (OMNIPAQUE ) 350 MG/ML injection 75 mL (75 mLs Intravenous Contrast Given 10/22/23 2338)    Clinical Course as of 10/22/23 2357   Tue Oct 22, 2023  2346 Coming from [facility] Scrotal abscess, spontaneously draining. No perirectal, perianal. Isolated to scrotum. Tachy to 150, no fever, elevated lactic. No white count. PCP visit last week, reported unintentional weight loss. Ordered chest ct, never went. IV fluids, vanc. Hx of HD perhaps [CG]    Clinical Course User Index [CG] Ruthell Lonni FALCON, PA-C                                 Medical Decision Making This patient presents to the ED for concern of scrotal abscess versus cellulitis, this involves an extensive number of treatment options, and is a complaint that carries with it a high risk of complications and morbidity.  The differential diagnosis includes scrotal abscess, Fournier's gangrene, perirectal abscess, perianal abscess, necrotizing fasciitis, cellulitis, erysipelas     Co morbidities that complicate the patient evaluation   Recent diagnosis of type 2 diabetes, hypertension     Additional history obtained:   Additional history obtained from record review External records from outside source obtained and reviewed including urgent care note from earlier today and PCP note from last week     Lab Tests:   I Ordered, and personally interpreted labs.  The pertinent results include: Initial lactic elevated at 2.4, repeat 2.9.  CBC without leukocytosis, patient is anemic with a hemoglobin of 8.3, I have no recent baseline for comparison but this is significantly reduced as compared to 4 years ago when he had a hemoglobin of 17.  CMP is abnormal, transaminitis noted with AST of 227, ALT 141, alkaline phosphatase of 491, and minimally elevated total bilirubin at 1.4.     Imaging Studies ordered:   I ordered imaging studies including CT abdomen/pelvis with contrast Imaging pending at time of shift change     Cardiac Monitoring: / EKG:   The patient was maintained on a cardiac monitor.  I personally viewed and interpreted the cardiac monitored which showed an  underlying rhythm of: NSR     Problem List / ED Course / Critical interventions / Medication management   IV fluids in accordance with sepsis protocols I ordered medication including IV vancomycin  for suspected scrotal abscess versus cellulitis     Social Determinants of Health:   Former tobacco use     Test / Admission - Considered:   Physical exam is notable as above.  Patient does appear to have an abscess at the base of his scrotum, this is actively draining at time of my examination.  Patient was significantly tachycardic ~150 at time  of his presentation to the emergency department today, given this with concern for infection and elevated lactic acid > 2 I did initiate sepsis protocols, including aggressive IV fluid administration and IV vancomycin .  Patient is without leukocytosis, however CMP is notable for significant transaminitis, unfortunately have no baseline for comparison but given this finding I did obtain a CT scan of his abdomen/pelvis to not only evaluate my concern for scrotal abscess but to also further evaluate any underlying liver pathology that may be contributing to his transaminitis today.  CT scan was pending at time of shift change. Patient was seen by [facility] on 10/6, was found to have a complaint of unintentional weight loss, patient was also tachycardic at that visit in the 110s.  A CT of his chest was ordered to evaluate for underlying malignancy, given the fact that the patient has been a longtime smoker.  Patient did not show up for his chest CT, therefore this was not completed.   Patient handed off to PA-C Lonni Lites pending results of CT scan, please see their note for additional information regarding assessment/plan/dispo.  Amount and/or Complexity of Data Reviewed Labs: ordered. Radiology: ordered.  Risk Prescription drug management.        Final diagnoses:  None    ED Discharge Orders     None           Glendia Rocky SAILOR, [place] 10/23/23 0001    Doretha Folks, MD 10/27/23 1057

## 2023-10-22 NOTE — ED Provider Triage Note (Signed)
 Emergency Medicine Provider Triage Evaluation Note  ALVAH LAGROW , a 68 y.o. male  was evaluated in triage.  Pt complains of scrotal perineal abscess seen at urgent care sent in for further evaluation.  Patient also markedly tachycardic and hypertensive.  Afebrile..  Review of Systems  Positive: abscess Negative: fever  Physical Exam  BP (!) 159/77 (BP Location: Right Arm)   Pulse (!) 149 Comment: Triage/Sort RN notified  Temp 98.3 F (36.8 C)   Resp 14   Ht 6' (1.829 m)   Wt 81.6 kg   SpO2 95%   BMI 24.41 kg/m  Gen:   Awake, no distress   Resp:  Normal effort  MSK:   Moves extremities without difficulty  Other:    Medical Decision Making  Medically screening exam initiated at 8:32 PM.  Appropriate orders placed.  DEQUANDRE CORDOVA was informed that the remainder of the evaluation will be completed by another provider, this initial triage assessment does not replace that evaluation, and the importance of remaining in the ED until their evaluation is complete.     Arloa Chroman, PA-C 10/22/23 2033

## 2023-10-22 NOTE — ED Notes (Signed)
 EMS notified to d/c truck for transport. Pt states he will drive himself.

## 2023-10-23 ENCOUNTER — Emergency Department (HOSPITAL_COMMUNITY)

## 2023-10-23 ENCOUNTER — Inpatient Hospital Stay (HOSPITAL_COMMUNITY)

## 2023-10-23 DIAGNOSIS — N492 Inflammatory disorders of scrotum: Secondary | ICD-10-CM

## 2023-10-23 DIAGNOSIS — N6320 Unspecified lump in the left breast, unspecified quadrant: Secondary | ICD-10-CM

## 2023-10-23 DIAGNOSIS — C799 Secondary malignant neoplasm of unspecified site: Secondary | ICD-10-CM

## 2023-10-23 DIAGNOSIS — N1330 Unspecified hydronephrosis: Secondary | ICD-10-CM | POA: Insufficient documentation

## 2023-10-23 DIAGNOSIS — E876 Hypokalemia: Secondary | ICD-10-CM | POA: Diagnosis present

## 2023-10-23 DIAGNOSIS — D638 Anemia in other chronic diseases classified elsewhere: Secondary | ICD-10-CM | POA: Diagnosis present

## 2023-10-23 DIAGNOSIS — R7401 Elevation of levels of liver transaminase levels: Secondary | ICD-10-CM | POA: Diagnosis not present

## 2023-10-23 DIAGNOSIS — I10 Essential (primary) hypertension: Secondary | ICD-10-CM | POA: Diagnosis present

## 2023-10-23 DIAGNOSIS — L732 Hidradenitis suppurativa: Secondary | ICD-10-CM | POA: Insufficient documentation

## 2023-10-23 DIAGNOSIS — Y833 Surgical operation with formation of external stoma as the cause of abnormal reaction of the patient, or of later complication, without mention of misadventure at the time of the procedure: Secondary | ICD-10-CM | POA: Diagnosis not present

## 2023-10-23 DIAGNOSIS — C61 Malignant neoplasm of prostate: Secondary | ICD-10-CM | POA: Diagnosis present

## 2023-10-23 DIAGNOSIS — D696 Thrombocytopenia, unspecified: Secondary | ICD-10-CM | POA: Diagnosis present

## 2023-10-23 DIAGNOSIS — C7802 Secondary malignant neoplasm of left lung: Secondary | ICD-10-CM | POA: Diagnosis present

## 2023-10-23 DIAGNOSIS — C50919 Malignant neoplasm of unspecified site of unspecified female breast: Secondary | ICD-10-CM | POA: Insufficient documentation

## 2023-10-23 DIAGNOSIS — E43 Unspecified severe protein-calorie malnutrition: Secondary | ICD-10-CM | POA: Diagnosis present

## 2023-10-23 DIAGNOSIS — L02215 Cutaneous abscess of perineum: Secondary | ICD-10-CM | POA: Diagnosis present

## 2023-10-23 DIAGNOSIS — G8929 Other chronic pain: Secondary | ICD-10-CM | POA: Diagnosis present

## 2023-10-23 DIAGNOSIS — L03315 Cellulitis of perineum: Secondary | ICD-10-CM | POA: Diagnosis present

## 2023-10-23 DIAGNOSIS — F17210 Nicotine dependence, cigarettes, uncomplicated: Secondary | ICD-10-CM | POA: Diagnosis present

## 2023-10-23 DIAGNOSIS — E860 Dehydration: Secondary | ICD-10-CM | POA: Diagnosis present

## 2023-10-23 DIAGNOSIS — N5082 Scrotal pain: Secondary | ICD-10-CM | POA: Diagnosis present

## 2023-10-23 DIAGNOSIS — E872 Acidosis, unspecified: Secondary | ICD-10-CM | POA: Diagnosis present

## 2023-10-23 DIAGNOSIS — N200 Calculus of kidney: Secondary | ICD-10-CM

## 2023-10-23 DIAGNOSIS — Z789 Other specified health status: Secondary | ICD-10-CM

## 2023-10-23 DIAGNOSIS — C7801 Secondary malignant neoplasm of right lung: Secondary | ICD-10-CM | POA: Diagnosis present

## 2023-10-23 DIAGNOSIS — D649 Anemia, unspecified: Secondary | ICD-10-CM

## 2023-10-23 DIAGNOSIS — N400 Enlarged prostate without lower urinary tract symptoms: Secondary | ICD-10-CM | POA: Diagnosis present

## 2023-10-23 DIAGNOSIS — Z8249 Family history of ischemic heart disease and other diseases of the circulatory system: Secondary | ICD-10-CM | POA: Diagnosis not present

## 2023-10-23 DIAGNOSIS — Z66 Do not resuscitate: Secondary | ICD-10-CM | POA: Diagnosis present

## 2023-10-23 DIAGNOSIS — N132 Hydronephrosis with renal and ureteral calculous obstruction: Secondary | ICD-10-CM | POA: Diagnosis present

## 2023-10-23 DIAGNOSIS — N201 Calculus of ureter: Secondary | ICD-10-CM

## 2023-10-23 DIAGNOSIS — C50922 Malignant neoplasm of unspecified site of left male breast: Secondary | ICD-10-CM | POA: Diagnosis present

## 2023-10-23 DIAGNOSIS — E119 Type 2 diabetes mellitus without complications: Secondary | ICD-10-CM | POA: Diagnosis present

## 2023-10-23 DIAGNOSIS — Z833 Family history of diabetes mellitus: Secondary | ICD-10-CM | POA: Diagnosis not present

## 2023-10-23 DIAGNOSIS — C7951 Secondary malignant neoplasm of bone: Secondary | ICD-10-CM | POA: Diagnosis present

## 2023-10-23 DIAGNOSIS — T8384XA Pain from genitourinary prosthetic devices, implants and grafts, initial encounter: Secondary | ICD-10-CM | POA: Diagnosis not present

## 2023-10-23 LAB — CBC
HCT: 20.1 % — ABNORMAL LOW (ref 39.0–52.0)
HCT: 21.3 % — ABNORMAL LOW (ref 39.0–52.0)
Hemoglobin: 6.6 g/dL — CL (ref 13.0–17.0)
Hemoglobin: 6.7 g/dL — CL (ref 13.0–17.0)
MCH: 31 pg (ref 26.0–34.0)
MCH: 30.2 pg (ref 26.0–34.0)
MCHC: 32.8 g/dL (ref 30.0–36.0)
MCHC: 31.5 g/dL (ref 30.0–36.0)
MCV: 94.4 fL (ref 80.0–100.0)
MCV: 95.9 fL (ref 80.0–100.0)
Platelets: 92 K/uL — ABNORMAL LOW (ref 150–400)
Platelets: 97 K/uL — ABNORMAL LOW (ref 150–400)
RBC: 2.13 MIL/uL — ABNORMAL LOW (ref 4.22–5.81)
RBC: 2.22 MIL/uL — ABNORMAL LOW (ref 4.22–5.81)
RDW: 21.3 % — ABNORMAL HIGH (ref 11.5–15.5)
RDW: 21.3 % — ABNORMAL HIGH (ref 11.5–15.5)
WBC: 7.3 K/uL (ref 4.0–10.5)
WBC: 7.8 K/uL (ref 4.0–10.5)
nRBC: 1.9 % — ABNORMAL HIGH (ref 0.0–0.2)
nRBC: 1.9 % — ABNORMAL HIGH (ref 0.0–0.2)

## 2023-10-23 LAB — URINALYSIS, ROUTINE W REFLEX MICROSCOPIC
Bacteria, UA: NONE SEEN
Bilirubin Urine: NEGATIVE
Glucose, UA: NEGATIVE mg/dL
Hgb urine dipstick: NEGATIVE
Ketones, ur: NEGATIVE mg/dL
Nitrite: NEGATIVE
Protein, ur: NEGATIVE mg/dL
Specific Gravity, Urine: 1.016 (ref 1.005–1.030)
pH: 6 (ref 5.0–8.0)

## 2023-10-23 LAB — COMPREHENSIVE METABOLIC PANEL WITH GFR
ALT: 136 U/L — ABNORMAL HIGH (ref 0–44)
AST: 205 U/L — ABNORMAL HIGH (ref 15–41)
Albumin: 2.4 g/dL — ABNORMAL LOW (ref 3.5–5.0)
Alkaline Phosphatase: 451 U/L — ABNORMAL HIGH (ref 38–126)
Anion gap: 9 (ref 5–15)
BUN: 10 mg/dL (ref 8–23)
CO2: 25 mmol/L (ref 22–32)
Calcium: 8.6 mg/dL — ABNORMAL LOW (ref 8.9–10.3)
Chloride: 105 mmol/L (ref 98–111)
Creatinine, Ser: 1.12 mg/dL (ref 0.61–1.24)
GFR, Estimated: >60 mL/min (ref >60)
Glucose, Bld: 223 mg/dL — ABNORMAL HIGH (ref 70–99)
Potassium: 3.1 mmol/L — ABNORMAL LOW (ref 3.5–5.1)
Sodium: 139 mmol/L (ref 135–145)
Total Bilirubin: 1.3 mg/dL — ABNORMAL HIGH (ref 0.0–1.2)
Total Protein: 6.4 g/dL — ABNORMAL LOW (ref 6.5–8.1)

## 2023-10-23 LAB — HEPATITIS PANEL, ACUTE
HCV Ab: NONREACTIVE
Hep A IgM: NONREACTIVE
Hep B C IgM: NONREACTIVE
Hepatitis B Surface Ag: NONREACTIVE

## 2023-10-23 LAB — GLUCOSE, CAPILLARY
Glucose-Capillary: 111 mg/dL — ABNORMAL HIGH (ref 70–99)
Glucose-Capillary: 132 mg/dL — ABNORMAL HIGH (ref 70–99)
Glucose-Capillary: 128 mg/dL — ABNORMAL HIGH (ref 70–99)

## 2023-10-23 LAB — I-STAT CG4 LACTIC ACID, ED: Lactic Acid, Venous: 1.7 mmol/L (ref 0.5–1.9)

## 2023-10-23 LAB — PREPARE RBC (CROSSMATCH)

## 2023-10-23 LAB — PSA: Prostatic Specific Antigen: 4.31 ng/mL — ABNORMAL HIGH (ref 0.00–4.00)

## 2023-10-23 LAB — RAPID URINE DRUG SCREEN, HOSP PERFORMED
Amphetamines: NOT DETECTED
Barbiturates: NOT DETECTED
Benzodiazepines: NOT DETECTED
Cocaine: NOT DETECTED
Opiates: NOT DETECTED
Tetrahydrocannabinol: POSITIVE — AB

## 2023-10-23 LAB — HIV ANTIBODY (ROUTINE TESTING W REFLEX): HIV Screen 4th Generation wRfx: NONREACTIVE

## 2023-10-23 LAB — RPR: RPR Ser Ql: NONREACTIVE

## 2023-10-23 LAB — HEMOGLOBIN AND HEMATOCRIT, BLOOD
HCT: 27.2 % — ABNORMAL LOW (ref 39.0–52.0)
Hemoglobin: 9.1 g/dL — ABNORMAL LOW (ref 13.0–17.0)

## 2023-10-23 LAB — FERRITIN: Ferritin: 3021 ng/mL — ABNORMAL HIGH (ref 24–336)

## 2023-10-23 LAB — HEMOGLOBIN A1C
Hgb A1c MFr Bld: 6.3 % — ABNORMAL HIGH (ref 4.8–5.6)
Mean Plasma Glucose: 134.11 mg/dL

## 2023-10-23 MED ORDER — THIAMINE MONONITRATE 100 MG PO TABS
100.0000 mg | ORAL_TABLET | Freq: Every day | ORAL | Status: DC
  Administered 2023-10-23 – 2023-10-27 (×3): 100 mg via ORAL
  Filled 2023-10-23 (×3): qty 1

## 2023-10-23 MED ORDER — ENSURE PLUS HIGH PROTEIN PO LIQD
237.0000 mL | Freq: Two times a day (BID) | ORAL | Status: DC
Start: 2023-10-23 — End: 2023-10-28
  Administered 2023-10-23 – 2023-10-27 (×5): 237 mL via ORAL
  Filled 2023-10-23: qty 237

## 2023-10-23 MED ORDER — POLYETHYLENE GLYCOL 3350 17 G PO PACK
17.0000 g | PACK | Freq: Every day | ORAL | Status: DC
  Administered 2023-10-23 – 2023-10-27 (×4): 17 g via ORAL
  Filled 2023-10-23 (×5): qty 1

## 2023-10-23 MED ORDER — THIAMINE HCL 100 MG/ML IJ SOLN
100.0000 mg | Freq: Every day | INTRAMUSCULAR | Status: DC
  Administered 2023-10-24 – 2023-10-25 (×2): 100 mg via INTRAVENOUS
  Filled 2023-10-23 (×3): qty 2

## 2023-10-23 MED ORDER — NICOTINE 14 MG/24HR TD PT24
14.0000 mg | MEDICATED_PATCH | Freq: Every day | TRANSDERMAL | Status: DC
  Administered 2023-10-23 – 2023-10-27 (×5): 14 mg via TRANSDERMAL
  Filled 2023-10-23 (×5): qty 1

## 2023-10-23 MED ORDER — POTASSIUM CHLORIDE 20 MEQ PO PACK
60.0000 meq | PACK | Freq: Once | ORAL | Status: AC
  Administered 2023-10-23: 60 meq via ORAL
  Filled 2023-10-23: qty 3

## 2023-10-23 MED ORDER — METRONIDAZOLE 500 MG PO TABS
500.0000 mg | ORAL_TABLET | Freq: Two times a day (BID) | ORAL | Status: DC
Start: 2023-10-23 — End: 2023-10-24
  Administered 2023-10-23: 500 mg via ORAL
  Filled 2023-10-23 (×2): qty 1

## 2023-10-23 MED ORDER — ADULT MULTIVITAMIN W/MINERALS CH
1.0000 | ORAL_TABLET | Freq: Every day | ORAL | Status: DC
  Administered 2023-10-23 – 2023-10-27 (×5): 1 via ORAL
  Filled 2023-10-23 (×5): qty 1

## 2023-10-23 MED ORDER — ONDANSETRON 4 MG PO TBDP
8.0000 mg | ORAL_TABLET | Freq: Once | ORAL | Status: AC
  Administered 2023-10-23: 8 mg via ORAL
  Filled 2023-10-23: qty 2

## 2023-10-23 MED ORDER — SODIUM CHLORIDE 0.9% IV SOLUTION: Freq: Once | INTRAVENOUS | Status: AC

## 2023-10-23 MED ORDER — ACETAMINOPHEN 500 MG PO TABS
1000.0000 mg | ORAL_TABLET | Freq: Four times a day (QID) | ORAL | Status: DC
  Administered 2023-10-23 (×3): 1000 mg via ORAL
  Filled 2023-10-23 (×4): qty 2

## 2023-10-23 MED ORDER — OXYCODONE HCL 5 MG PO TABS
5.0000 mg | ORAL_TABLET | ORAL | Status: DC | PRN
  Administered 2023-10-23 – 2023-10-26 (×5): 5 mg via ORAL
  Filled 2023-10-23 (×6): qty 1

## 2023-10-23 MED ORDER — SENNA 8.6 MG PO TABS
1.0000 | ORAL_TABLET | Freq: Every day | ORAL | Status: DC
  Administered 2023-10-23 – 2023-10-27 (×4): 8.6 mg via ORAL
  Filled 2023-10-23 (×5): qty 1

## 2023-10-23 MED ORDER — ACETAMINOPHEN 650 MG RE SUPP
650.0000 mg | Freq: Four times a day (QID) | RECTAL | Status: DC
  Filled 2023-10-23: qty 1

## 2023-10-23 MED ORDER — VANCOMYCIN HCL 750 MG/150ML IV SOLN
750.0000 mg | Freq: Two times a day (BID) | INTRAVENOUS | Status: DC
  Administered 2023-10-23 – 2023-10-25 (×4): 750 mg via INTRAVENOUS
  Filled 2023-10-23 (×5): qty 150

## 2023-10-23 MED ORDER — INSULIN ASPART 100 UNIT/ML IJ SOLN
0.0000 [IU] | Freq: Three times a day (TID) | INTRAMUSCULAR | Status: DC
  Administered 2023-10-23: 1 [IU] via SUBCUTANEOUS
  Administered 2023-10-25 (×2): 2 [IU] via SUBCUTANEOUS
  Administered 2023-10-27: 1 [IU] via SUBCUTANEOUS

## 2023-10-23 MED ORDER — ENOXAPARIN SODIUM 40 MG/0.4ML IJ SOSY: 40.0000 mg | PREFILLED_SYRINGE | INTRAMUSCULAR | Status: DC

## 2023-10-23 MED ORDER — ATORVASTATIN CALCIUM 80 MG PO TABS
80.0000 mg | ORAL_TABLET | Freq: Every day | ORAL | Status: DC
  Administered 2023-10-23 – 2023-10-27 (×5): 80 mg via ORAL
  Filled 2023-10-23 (×5): qty 1

## 2023-10-23 MED ORDER — IOHEXOL 350 MG/ML SOLN
75.0000 mL | Freq: Once | INTRAVENOUS | Status: AC | PRN
  Administered 2023-10-23: 60 mL via INTRAVENOUS

## 2023-10-23 MED ORDER — FOLIC ACID 1 MG PO TABS
1.0000 mg | ORAL_TABLET | Freq: Every day | ORAL | Status: DC
  Administered 2023-10-23 – 2023-10-27 (×4): 1 mg via ORAL
  Filled 2023-10-23 (×4): qty 1

## 2023-10-23 MED ORDER — SODIUM CHLORIDE 0.9 % IV SOLN
2.0000 g | Freq: Three times a day (TID) | INTRAVENOUS | Status: DC
  Administered 2023-10-23 – 2023-10-25 (×7): 2 g via INTRAVENOUS
  Filled 2023-10-23 (×6): qty 12.5

## 2023-10-23 NOTE — Sepsis Progress Note (Signed)
 Notified provider of need to order 3rd repeat lactic acid.

## 2023-10-23 NOTE — Telephone Encounter (Signed)
 Cannot have CT scheduled due to patient being in the hospital today, he does have an appointment with PCP tomorrow. If he shows CT can be scheduled then. Cassell Mary CMA

## 2023-10-23 NOTE — Assessment & Plan Note (Addendum)
 Multiple metastases with unknown origin.  Potentially breast versus prostate versus multiple myeloma. Unable to do breast US  inpatient.  - Consult oncology  - Pain management: Scheduled Tylenol  1000 mg every 6 hours, oxycodone  5 mg every 4 hours as needed - Scheduled bowel regimen with MiraLAX and senna once daily - RD consult, appreciate recommendations - Labs: Protein electrophoresis, PSA, kappa/lambda light chains

## 2023-10-23 NOTE — Assessment & Plan Note (Addendum)
 Chronic use.  AST/ALT elevated likely secondary to alcohol  use.  Hepatitis panel nonreactive.  No known history of alcohol  withdrawal or seizures. -CIWA protocol -Nutritional supplements including thiamine  - AM CMP

## 2023-10-23 NOTE — Progress Notes (Signed)
 Pharmacy Antibiotic Note  Larry Houston is a 68 y.o. male admitted on 10/22/2023 with scrotal cellulitis.  Pharmacy has been consulted for vancomycin  and cefepime dosing.  Plan: Vancomycin  750mg  IV Q12H. Goal AUC 400-550.  Expected AUC 450. Cefepime 2g IV Q8H.  Height: 6' (182.9 cm) Weight: 81.6 kg (180 lb) IBW/kg (Calculated) : 77.6  Temp (24hrs), Avg:98.2 F (36.8 C), Min:98.2 F (36.8 C), Max:98.3 F (36.8 C)  Recent Labs  Lab 10/22/23 2106 10/22/23 2110 10/22/23 2230 10/22/23 2238 10/23/23 0220  WBC 8.0  --   --   --   --   CREATININE  --   --  1.24  --   --   LATICACIDVEN  --  2.4*  --  2.9* 1.7    Estimated Creatinine Clearance: 63.4 mL/min (by C-G formula based on SCr of 1.24 mg/dL).    Allergies  Allergen Reactions   Penicillins Hives    Has patient had a PCN reaction causing immediate rash, facial/tongue/throat swelling, SOB or lightheadedness with hypotension: No Has patient had a PCN reaction causing severe rash involving mucus membranes or skin necrosis: No Has patient had a PCN reaction that required hospitalization No Has patient had a PCN reaction occurring within the last 10 years: No If all of the above answers are NO, then may proceed with Cephalosporin use.    Thank you for allowing pharmacy to be a part of this patient's care.  Marvetta Dauphin, PharmD, BCPS  10/23/2023 4:12 AM

## 2023-10-23 NOTE — Telephone Encounter (Signed)
-----   Message from [facility] sent at 10/22/2023  1:22 PM EDT ----- Attempted to call patient x2 regarding results of spine Xrays. Lumbar spine xray showed degenerative disease. However, thoracic Xray had equivocal findings and recommendation by radiologist for  further evaluation with CT. Order placed for this and left voicemail for patient to call office for results.  Red team, could you please help get a CT scan ordered for this patient? ----- Message ----- From: Interface, Rad Results In Sent: 10/22/2023   1:05 AM EDT To: Alan Flies, MD

## 2023-10-23 NOTE — ED Notes (Addendum)
 No return call after page to Anders, MD. Secure chat sent to Otto Anders, MD. Message was forwarded to [facility] Teaching Service. Message was received. Awaiting new orders.

## 2023-10-23 NOTE — ED Notes (Signed)
 Patient transported to CT

## 2023-10-23 NOTE — Assessment & Plan Note (Addendum)
 CTAP with R moderate hydronephrosis and 12 mm proximal right ureteral calculus at the level of L3-L4. - Urology consulted as above, appreciate recommendations

## 2023-10-23 NOTE — H&P (Addendum)
 Hospital Admission History and Physical Service Pager: [phone]  Patient name: Larry Houston Medical record number: [id number] Date of Birth: [birth year] Age: 68 y.o. Gender: male  Primary Care Provider: Larraine Palma, MD Consultants: none Code Status:  DNR/DNI Preferred Emergency Contact:   Contact Information     Name Relation Home Work Mobile   [place] Friend [phone]        Other Contacts   None on File     Chief Complaint: Scrotal pain and purulent drainage  Assessment and Plan: Larry Houston is a 68 y.o. male presenting with scrotal pain and drainage secondary to cellulitis, no abscess formation identified on CT.  Also with chronic unintentional weight loss, back pain, fecal and urinary incontinence with CT evidence of diffuse metastatic disease of unclear origin, potential sources of primary origin include prostate, lung, colon.  Assessment & Plan Cellulitis, scrotum Patient with spontaneously draining scrotal cellulitis.  No discrete abscess visualized on CT. Initially cf sepsis given lactic acidosis and tachycardia; lactic acid now within normal limits.  Recent fecal incontinence concerning for anaerobic contamination of wound. - Admit to FM TS, [place], med/tele - Telemetry - Continue IV vancomycin  15 g every 12 hours - Start IV cefepime - Follow blood cultures - AM CBC, BMP - Transition to regular diet, discontinue LR - Wound care consult Metastatic disease (HCC) Diffuse bony, nodal, and breast metastases based on CT findings, concerning for primary prostate cancer.  40 pounds of unintentional weight loss in the last several years.  Not up-to-date on colonoscopy.  Also significant smoking history. Alk phos likely 2/2 bone turnover. Low cf cauda equina given pt is able to urinate at bedside w/o issue, LE neurovascularly intact.  - Consult oncology in the morning - Pain management: Scheduled Tylenol  1000 mg every 6 hours, oxycodone  5 mg every 4  hours as needed - Scheduled bowel regimen with MiraLAX and senna once daily - RD consult Alcohol  use (HCC) Transaminitis Chronic alcohol  use, 3 drinks a day.  Last drink yesterday.  No history of withdrawal or seizures.  AST/ALT elevations suspected due to to alcoholic hepatitis. -CIWA protocol -Nutritional supplements including thiamine  -Will follow acute hepatitis panel - AM CMP Hidradenitis suppurativa of multiple sites Patient with several weeks of indurated, tunneled, draining axillary lesions concerning for HS - Monitor symptom progression with IV antibiotic management Chronic health problem HTN: amlodipine  5 mg every day  History of CVA: aspirin  81 mg every day, lipitor  80 mg every day  T2DM: last A1c on 09/26/23 7.1  FEN/GI: regular VTE Prophylaxis: lovenox   Disposition: Med/tele  History of Present Illness:  Larry Houston is a 68 y.o. male presenting with pain and drainage of a scrotal infection.  About 3 days ago, started to feel pain and swelling in his perineal area. Went to UC yesterday, and then directed to ED. Pt reports that the abscess started draining while he was in ED waiting area. Last BM was about 3 days ago.  Currently reports some low back and R thigh pain. Pain is 10/10. Was started on gabapentin , does not think it has been helpful.   Went to establish care with PCP last week at which time he was reporting chronic back pain and recent unintentional weight loss, and went to get labs and CT scan of back. Was supposed to get a colonoscopy. Reports waking up frequently at night to urinate (about 7 times) for about 3-4 months. Also reports feeling generally weak and fatigued. Used to  weigh 230 about 1 year ago, now 173. Reports decreased appetite.  No nausea, vomiting, fevers.  No dysuria, flank pain.  In the ED, his scrotal lesion was found to be spontaneously draining.  He was empirically started on vancomycin . Lactic 2.4>2.9>1.7 s/p 2L LR.  Initially tachycardic  to 150 but this resolved after IV fluids.  CTs ordered and results detailed below. Ast 227 ALT 141 Acute hepatitis panel ordered   Review Of Systems: Per HPI   Pertinent Past Medical History: Chronic alcohol  use HTN Chronic back pain Remainder reviewed in history tab.   Pertinent Past Surgical History: 01/26/2015 laminectomy and foraminotomy  Remainder reviewed in history tab.   Pertinent Social History: - Tobacco 1 pack a day for 10 years. - Alcohol : Drinks 3 glasses of whiskey drinks. Last drink was yesterday afternoon. No history of alcohol   - Denies other drug use.  Lives with roommate who is emergency contact  Pertinent Family History: Remainder reviewed in history tab.   Important Outpatient Medications: Aspirin  81 mg every day Atorvastatin  80 mg every day Amlodipine  5 mg every day   Remainder reviewed in medication history.   Objective: BP (!) 144/74   Pulse 90   Temp 98.2 F (36.8 C)   Resp 20   Ht 6' (1.829 m)   Wt 81.6 kg   SpO2 95%   BMI 24.41 kg/m  Exam: General: Well-appearing male who is intermittently tearful, sitting up in bed CV: Tachycardic, regular rhythm, no murmurs Respiratory: Lungs clear to auscultation bilaterally, breathing comfortably on room air, no increased work of breathing Abdomen: Soft, nontender, mild distention Lumbar: No CVA tenderness, moderate generalized paraspinal and spinous process tenderness in the thoracic and lumbar region Genitals: Examined with Dr. Elicia at bedside, erythema and tender induration of the posterior scrotum with a central opening with purulent drainage, no extension to the anterior scrotum or anus, there is a small abrasion along the gluteal cleft on the left side, no purulence noted at this region Axilla: Bilateral axillas with scarring, tunneling, multiple areas of purulent drainage that is tender to palpation  Labs:  CBC BMET  Recent Labs  Lab 10/22/23 2106  WBC 8.0  HGB 8.3*  HCT 25.3*  PLT 113*    Recent Labs  Lab 10/22/23 2230  NA 140  K 3.6  CL 104  CO2 25  BUN 11  CREATININE 1.24  GLUCOSE 124*  CALCIUM  8.7*    Lactic 2.4>2.9>1.7 AST/ALT elevated  EKG: Normal sinus rhythm   Imaging Studies Performed:  CT Angio Chest PE W and/or Wo Contrast Result Date: 10/23/2023 EXAM: CTA of the Chest with contrast for PE 10/23/2023 12:28:06 AM TECHNIQUE: CTA of the chest was performed without and with the administration of 60 mL of intravenous contrast (iohexol  (OMNIPAQUE ) 350 MG/ML injection 75 mL IOHEXOL  350 MG/ML SOLN). Multiplanar reformatted images are provided for review. MIP images are provided for review. Automated exposure control, iterative reconstruction, and/or weight based adjustment of the mA/kV was utilized to reduce the radiation dose to as low as reasonably achievable. COMPARISON: None available. CLINICAL HISTORY: Pulmonary embolism (PE) suspected, high prob. F/u from CT abdomen/pelvis w/ suspicious findings; Abscess; 60 ml omni 350. FINDINGS: PULMONARY ARTERIES: Pulmonary arteries are adequately opacified for evaluation. No pulmonary embolism. Main pulmonary artery is normal in caliber. MEDIASTINUM: The heart and pericardium demonstrate no acute abnormality. There is no acute abnormality of the thoracic aorta. LYMPH NODES: Left axillary nodal metastases measuring up to 2.0 cm (image 42). 13 mm short  axis left subpectoral nodal metastasis (image 32). 13 mm short axis subcarinal node (image 69). LUNGS AND PLEURA: Mild interlobar septal thickening with subpleural nodularity in the lungs bilaterally, measuring up to 4 mm in the lingula (image 90). This is nonspecific but suspicious for perilymphatic tumor spread in this clinical setting. Trace bilateral pleural effusions, left greater than right. No pneumothorax. UPPER ABDOMEN: Limited images of the upper abdomen are unremarkable. SOFT TISSUES AND BONES: 8.3 x 3.8 cm left breast mass with overlying cutaneous thickening (image 81),  suspicious for male breast cancer. Diffuse mixed sclerotic metastases throughout the visualized axial and appendicular skeleton. IMPRESSION: 1. No pulmonary embolism. 2. 8.3 cm left breast mass with overlying cutaneous thickening, suspicious for male breast cancer. 3. Left axillary, left subpectoral, and subcarinal nodal metastases. 4. Subpleural pulmonary nodularity, suspicious for metastases in this clinical setting. 5. Diffuse osseous metastases throughout the visualized axial and appendicular skeleton. 6. Trace bilateral pleural effusions. Electronically signed by: Pinkie Pebbles MD 10/23/2023 12:36 AM EDT RP Workstation: [id number]   CT ABDOMEN PELVIS W CONTRAST Result Date: 10/22/2023 EXAM: CT ABDOMEN AND PELVIS WITH CONTRAST 10/22/2023 11:38:08 PM TECHNIQUE: CT of the abdomen and pelvis was performed with the administration of 75 mL of intravenous contrast (iohexol  (OMNIPAQUE ) 350 MG/ML injection 75 mL IOHEXOL  350 MG/ML SOLN). Multiplanar reformatted images are provided for review. Automated exposure control, iterative reconstruction, and/or weight-based adjustment of the mA/kV was utilized to reduce the radiation dose to as low as reasonably achievable. COMPARISON: None available. CLINICAL HISTORY: Concern for scrotal abscess, elevated LFT's. Pt complains of scrotal perineal abscess seen at urgent care sent in for further evaluation. FINDINGS: LOWER CHEST: Subpleural micronodularity in the lungs bilaterally, including a dominant 4 mm lingular nodule (image 1), indeterminate. Trace left pleural effusion. LIVER: Subcentimeter cyst in the left hepatic lobe (image 18), benign. GALLBLADDER AND BILE DUCTS: Gallbladder is unremarkable. No biliary ductal dilatation. SPLEEN: No acute abnormality. PANCREAS: No acute abnormality. ADRENAL GLANDS: No acute abnormality. KIDNEYS, URETERS AND BLADDER: Moderate right hydronephrosis with associated 12 mm proximal right ureteral calculus at the L3-L4 level. No stones in  the left kidney or left ureter. No perinephric or periureteral stranding. Mildly thick-walled bladder, although underdistended. GI AND BOWEL: Stomach demonstrates no acute abnormality. Normal appendix (image 52). There is no bowel obstruction. PERITONEUM AND RETROPERITONEUM: No ascites. No free air. VASCULATURE: Aorta is normal in caliber. Atherosclerotic calcifications of the abdominal aorta and branch vessels, although patent. LYMPH NODES: No lymphadenopathy. REPRODUCTIVE ORGANS: Prostatomegaly. Mild scrotal wall thickening/edema. No discrete abscess on CT. BONES AND SOFT TISSUES: Tiny fat-containing bilateral inguinal hernias. Diffuse sclerotic metastases throughout the visualized axial and appendicular skeleton. IMPRESSION: 1. Moderate right hydronephrosis with associated 12 mm proximal right ureteral calculus at the L3-L4 level. 2. Mild scrotal wall thickening/edema without discrete abscess on CT. 3. Diffuse sclerotic metastases throughout the visualized axial and appendicular skeleton. Correlate for history of malignancy, presumably prostate CA. 4. Subpleural micronodularity measuring up to 4 mm in the lingula. In the setting of suspected primary malignancy, Fleischner Society guidelines do not apply. Consider follow-up CT chest as clinically warranted. Electronically signed by: Pinkie Pebbles MD 10/22/2023 11:52 PM EDT RP Workstation: [id number]   DG Thoracic Spine W/Swimmers Result Date: 10/22/2023 CLINICAL DATA:  Back pain EXAM: THORACIC SPINE - 3 VIEWS COMPARISON:  None Available. FINDINGS: Questionable mottled appearance and sclerosis throughout the thoracic spine. No fracture or malalignment. Disc spaces are maintained. Anterior and lateral spurring. IMPRESSION: Questionable mottled/sclerotic appearance of the thoracic spine. Consider  further evaluation with CT to exclude sclerotic lesions/metastases. Electronically Signed   By: Franky Crease M.D.   On: 10/22/2023 01:03   DG Lumbar Spine  Complete Result Date: 10/22/2023 CLINICAL DATA:  Back pain EXAM: LUMBAR SPINE - COMPLETE 4+ VIEW COMPARISON:  None Available. FINDINGS: Degenerative facet disease, most pronounced at L4-5 and L5-S1. 4 mm anterolisthesis of L4 on L5. Disc spaces are maintained. No fracture. IMPRESSION: Degenerative facet disease in the lower lumbar spine. No acute bony abnormality. Electronically Signed   By: Franky Crease M.D.   On: 10/22/2023 00:58      Alena Morrison, Elio, MD 10/23/2023, 2:53 AM PGY-1, [facility]  FPTS Intern pager: [phone], text pages welcome Secure chat group [facility] Teaching Service

## 2023-10-23 NOTE — Progress Notes (Incomplete)
 Initial Nutrition Assessment  DOCUMENTATION CODES:   Severe malnutrition in context of chronic illness  INTERVENTION:  Encourage PO intake Small frequent meals and snacks  Recommend regular diet after surgery  Ensure Plus High Protein po BID, each supplement provides 350 kcal and 20 grams of protein Mighty Shake TID with meals, each supplement provides 330 kcals and 9 grams of protein (prefers vanilla) Continue MVI/Folic acid /Thiamine  supplementation  Consider adding appetite stimulant if continued poor po intake  High calorie, High protein handout I AVS  NUTRITION DIAGNOSIS:   Severe Malnutrition related to chronic illness, cancer and cancer related treatments as evidenced by severe muscle depletion, mild fat depletion, energy intake < or equal to 75% for > or equal to 1 month, percent weight loss (pt has unintentionally lost 50 lbs, 22% in 1 year however,).  GOAL:   Patient will meet greater than or equal to 90% of their needs  MONITOR:   PO intake, Supplement acceptance, Diet advancement, Labs, I & O's  REASON FOR ASSESSMENT:   Consult Assessment of nutrition requirement/status  ASSESSMENT:  68 y/o Male with PMH of ETOH and Tobacco abuse, HTN, CVA (2018), HTN, chronic back pain who presents with hx of scrotal swelling and drainage secondary to cellulitis, weight loss, fecal and urinary incontinence. CT with evidence of metastatic disease of unknown origon.  10/15 - CTA chest shows 8.3 cm left breast mass with overlying cutaneous thickening, suspicious for male breast cancer and possible metastatic lung nodules. CT ABD/Pelvis shows possible prostate malignancy, ureteral stone  10/16 -  Plan for cystoscopy with right retrograde pyelogram, possible ureteroscopy with right laser lithotripsy, and right ureteral stent placement.  Pt in pleasant mood this morning however reports new onset of abdominal pain and vomiting. Pt reports he vomited yesterday after eating his lunch and has  not wanted to eat anything since. Has been NPO since midnight for procedure today. This morning does report having an appetite.   Pt reports he started noticing a decrease in his appetite and po intake about a year ago. Was only eating breakfast and dinner in the last year. Drinks 3 Gingerale/day and water.  Pt reports he did not eat lunch or snack during the day because he was not hungry. Was not taking any vitamins or supplements PTA. Chronic alcohol  use, 3 drinks a day.  Last drink 10/13.  Reports being around 230 lbs 1 year ago and at his doctors office a couple weeks ago was around 180 lbs. If this is accurate pt has unintentionally lost 50 lbs, 22% in 1 year however, limited weight history available to confirm accuracy of weight loss.   On exam pt with severe muscle and mild fat deficits mostly in lower extremities. Pt reports he has become more unsteady with his walking and feels weaker. Lives with roommate.   Encouraged pt to increase po intake once back on diet. Pt willing to try ONS. Encouraged small frequent meals to help with nausea. May benefit from appetite stimulant.   Oncology following   Admit weight: 81.6 kg Current weight: 81.6 kg  10/22/23 81.6 kg  10/14/23 81.5 kg  09/07/19 99.8 kg  07/22/19 93 kg  04/04/14 109.7 kg  04/04/14 110.2 kg   Average Meal Intake: 8/15: 100% intake x 1 recorded meals  Nutritionally Relevant Medications: Scheduled Meds:  feeding supplement  237 mL Oral BID BM   folic acid   1 mg Oral Daily   insulin aspart  0-9 Units Subcutaneous TID WC   multivitamin  with minerals  1 tablet Oral Daily   senna  1 tablet Oral Daily   thiamine   100 mg Oral Daily   Or   thiamine   100 mg Intravenous Daily   Continuous Infusions:  acetaminophen  1,000 mg (10/24/23 0856)   ceFEPime (MAXIPIME) IV 2 g (10/24/23 0400)   vancomycin  750 mg (10/23/23 2328)   Labs Reviewed: AP 451 AST 205 ALT 136 CBG ranges from 112-223 mg/dL over the last 24 hours HgbA1c  6.3   NUTRITION - FOCUSED PHYSICAL EXAM:  Flowsheet Row Most Recent Value  Orbital Region Mild depletion  Upper Arm Region Mild depletion  Thoracic and Lumbar Region Mild depletion  Buccal Region Mild depletion  Temple Region Mild depletion  Clavicle Bone Region Moderate depletion  Clavicle and Acromion Bone Region Moderate depletion  Scapular Bone Region Mild depletion  Dorsal Hand Mild depletion  Patellar Region Severe depletion  Anterior Thigh Region Severe depletion  Posterior Calf Region Severe depletion  Edema (RD Assessment) None  Hair Reviewed  Eyes Reviewed  Mouth Reviewed  Skin Reviewed  Nails Reviewed    Diet Order:   Diet Order             Diet NPO time specified  Diet effective midnight                   EDUCATION NEEDS:   Education needs have been addressed  Skin:  Skin Assessment: Reviewed RN Assessment  Last BM:  10/15 - type 2 x 1  Height:   Ht Readings from Last 1 Encounters:  10/22/23 6' (1.829 m)    Weight:   Wt Readings from Last 1 Encounters:  10/22/23 81.6 kg    Ideal Body Weight:  80.9 kg  BMI:  Body mass index is 24.41 kg/m.  Estimated Nutritional Needs:   Kcal:  2200-2400 kcal  Protein:  110-130 gm  Fluid:  >2L/day   Olivia Kenning, RD Registered Dietitian  See Amion for more information

## 2023-10-23 NOTE — Assessment & Plan Note (Addendum)
 Patient with spontaneously draining scrotal cellulitis.  No discrete abscess visualized on CT. Initially cf sepsis given lactic acidosis and tachycardia; lactic acid now within normal limits.  Recent fecal incontinence concerning for anaerobic contamination of wound. - Admit to FM TS, [place], med/tele - Telemetry - Continue IV vancomycin  15 g every 12 hours - Start IV cefepime - Follow blood cultures - AM CBC, BMP - Transition to regular diet, discontinue LR - Wound care consult

## 2023-10-23 NOTE — Assessment & Plan Note (Addendum)
 Patient with spontaneously draining scrotal cellulitis.  No discrete abscess visualized on CT.  Recent fecal incontinence concerning for anaerobic contamination of wound. - Antibiotics: IV Vancomycin  750 mg Q12h, IV cefepime 2 g Q8h, and add oral metronidazole  500 mg Q12h - Urology consulted, appreciate recommendations - Blood cultures no growth <12 hours - AM CBC, BMP

## 2023-10-23 NOTE — Assessment & Plan Note (Signed)
 HTN: amlodipine  5 mg every day  History of CVA: aspirin  81 mg every day, lipitor  80 mg every day  T2DM: last A1c on 09/26/23 7.1

## 2023-10-23 NOTE — Hospital Course (Addendum)
 Larry Houston is a 68 y.o.male with history of HTN, CVA, chronic EtOH use, and chronic back pain who was admitted to the [facility] Medicine Teaching Service at [facility] for perineal cellulitis and metastatic cancer. His hospital course is outlined below:  Scrotal abscess Patient initially presented with pain and swelling in the perineal region and scrotal lesion that began spontaneously draining in the ED. Elevated lactic acid and tachycardia initially but patient did not meet sepsis criteria and both improved with IVF. Blood cultures were collected and patient started on empiric treatment with Vancomycin  and Cefepime. Patient also reported recent fecal incontinence concerning for anaerobic contamination of wound and antibiotics were adjusted accordingly. Workup included CTAP which showed mild scrotal wall thickening and edema but no abscess. Urology was consulted and I&D was performed on 10/16 (unable to collect intraoperative cultures). Blood cultures showed no growth and patient remained afebrile throughout admission. Final antibiotic regimen included IV Ciprofloxacin  and IV Metronidazole  which were transitioned to PO upon discharge.  Metastatic disease Patient recently seen by PCP for unintentional weight loss (down to 173 from 230lbs 1 year ago). CTAP showed diffuse sclerotic metastases throughout the entire visualized axial and appendicular skeleton, presumably prostate cancer.  Also noted to have subpleural micronodularity measuring up to 4 mm in the lingula. CTA obtained for further workup showed 8.3 cm left breast mass with overlying cutaneous thickening suspicious for male breast cancer.  Also noted to have left axillary, left subpectoral, and subcarinal nodal metastases; subpleural pulmonary nodularity suspicious metastases; diffuse osseous metastases also noted throughout entire visualized axial and appendicular skeleton. Unable to obtain breast ultrasound inpatient. Workup includes protein  electrophoresis with elevated alpha-1-globulin and gamma globulin, elevated kappa and lamda chains and elevated PSA. Oncology was consulted who recommended IR-guided bone biopsy which was performed on 10/17; results are pending at the time of discharge.Oncology to arrange outpatient follow up for further work-up and treatment.  Right proximal ureteral calculus CTAP also showed moderate R hydronephrosis with associated 12mm proximal R uretral stone. Urology was consulted and performed R retrograde pyelogram and cystoscopy at the time of scrotal I&D. Placement of R ureteral stent placement was unsuccessful so nephrostomy tube was placed by IR on 10/17 (at the time of bone biopsy). Tube was draining well at the time of discharge and patient was provided education about dressing changes. Patient will require outpatient surgery with Dr. Roseann for definitive management of stone.  Transaminitis/Alcohol  use Likely 2/2 chronic EtOH use and in part due to bony lesions. Hepatitis panel was nonreactive. LFTs were downtrending at the time of discharge. Patient did not show signs of alcohol  withdrawal during admission.  Anemia Hgb 8.3 > 6.6. Patient was transfused with 1 unit PRBCs. Hemoglobin remained stable at 8-9 after. High saturation, low TIBC and elevated ferritin indicate this is likely anemia of chronic disease.  Hidradenitis suppurativa Patient presented with several weeks of indurated, tunneled, draining axillary lesions. This may be HS or lymphadenopathy  from metastatic cancer. Will need further workup to determine.  Other chronic conditions were medically managed with home medications and formulary alternatives as necessary (T2DM, HTN, h/o CVA)  PCP Follow-up recommendations: Repeat Echo Complete Advanced Directive Ensure follow up with Urology and Oncology

## 2023-10-23 NOTE — Progress Notes (Addendum)
 Daily Progress Note Intern Pager: [phone]  Patient name: Larry Houston Medical record number: [id number] Date of birth: 02/15/1955 Age: 68 y.o. Gender: male  Primary Care Provider: Larraine Palma, MD Consultants: Urology Code Status: DNR/DNI  [name]  Medical Decision Making:  Larry Houston is a 68 year old male with a PMH of HTN, chronic alcohol  use, and chronic back pain who presented with scrotal pain and drainage 2/2 cellulitis.  Patient also with unintentional weight loss, back pain, fecal and urinary incontinence found to have diffuse metastatic disease of unknown origin on imaging.  Hemoglobin 6.7 this morning, transfusion PRBC ordered. Urology consulted today, appreciate recommendations.  Assessment & Plan Cellulitis, scrotum Patient with spontaneously draining scrotal cellulitis.  No discrete abscess visualized on CT.  Recent fecal incontinence concerning for anaerobic contamination of wound. 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  Potentially breast versus prostate versus multiple myeloma. Unable to do breast US  inpatient.  - Consult oncology  - Pain management: Scheduled Tylenol  1000 mg every 6 hours, oxycodone  5 mg every 4 hours as needed - Scheduled bowel regimen with MiraLAX and senna once daily - RD consult, appreciate recommendations - Labs: Protein electrophoresis, PSA, kappa/lambda light chains Acute anemia Hemoglobin 6.6. Repeat 6.7.  1 unit PRBC ordered. - Will monitor with posttransfusion H&H  - AM CBC Hydronephrosis of right kidney Nephrolithiasis CTAP with R moderate hydronephrosis and 12 mm proximal right ureteral calculus at the level of L3-L4. - Urology consulted as above,  appreciate recommendations Alcohol  use (HCC) Transaminitis Chronic use.  AST/ALT elevated likely secondary to alcohol  use.  Hepatitis panel nonreactive.  No known history of alcohol  withdrawal or seizures. -CIWA protocol -Nutritional supplements including thiamine  - AM CMP Hidradenitis suppurativa of multiple sites Patient with several weeks of indurated, tunneled, draining axillary lesions concerning for HS.  Potentially lymphadenopathy from metastatic cancer. - Monitor symptom progression with IV antibiotic management Chronic health problem HTN: amlodipine  5 mg every day  History of CVA: aspirin  81 mg every day, lipitor  80 mg every day  T2DM: last A1c on 09/26/23 7.1  FEN/GI: Regular PPx: Lovenox  Dispo: Home pending clinical improvement.  Subjective:  Patient is seen lying in bed this morning.  He states his pain is well-controlled at this time.  He endorses that his scrotum is still tender to touch but has no pain when lying in the bed.  Objective: Temp:  [98.2 F (36.8 C)-98.5 F (36.9 C)] 98.5 F (36.9 C) (10/15 0500) Pulse Rate:  [81-149] 94 (10/15 0800) Resp:  [14-22] 17 (10/15 0800) BP: (123-167)/(67-85) 123/79 (10/15 0800) SpO2:  [95 %-100 %] 99 % (10/15 0800) Weight:  [81.6 kg] 81.6 kg (10/14 1940) Physical Exam: General: NAD Cardiovascular: RRR, no M/R/G Respiratory: CTAB, normal work of breathing on room air Abdomen: soft, non-distended, non-tender to palpation  GU: scrotal lesion with bloody drainage on the posterior scrotum  Extremities: no edema   Laboratory: Most recent CBC Lab Results  Component Value Date   WBC 7.8 10/23/2023   HGB 6.7 (LL) 10/23/2023   HCT 21.3 (L) 10/23/2023   MCV 95.9 10/23/2023   PLT 97 (L) 10/23/2023   Most recent BMP    Latest Ref Rng & Units 10/23/2023  4:53 AM  BMP  Glucose 70 - 99 mg/dL 776   BUN 8 - 23 mg/dL 10   Creatinine 9.38 - 1.24 mg/dL 8.87   Sodium 864 - 854 mmol/L 139   Potassium 3.5 - 5.1 mmol/L 3.1    Chloride 98 - 111 mmol/L 105   CO2 22 - 32 mmol/L 25   Calcium  8.9 - 10.3 mg/dL 8.6     Other pertinent labs: Alk phos 451, AST 205, ALT 136.  Imaging/Diagnostic Tests: No new imaging.  [name], DO 10/23/2023, 8:08 AM  PGY-1, [facility] FPTS Intern pager: [phone], text pages welcome Secure chat group [facility] Teaching Service

## 2023-10-23 NOTE — Assessment & Plan Note (Addendum)
 HTN: amlodipine  5 mg every day  History of CVA: aspirin  81 mg every day, lipitor  80 mg every day  T2DM: last A1c on 09/26/23 7.1

## 2023-10-23 NOTE — ED Provider Notes (Addendum)
 Physical Exam  BP (!) 144/74   Pulse 90   Temp 98.2 F (36.8 C)   Resp 20   Ht 6' (1.829 m)   Wt 81.6 kg   SpO2 95%   BMI 24.41 kg/m   Physical Exam Vitals and nursing note reviewed.  Constitutional:      General: He is not in acute distress.    Appearance: He is not toxic-appearing.  HENT:     Head: Normocephalic and atraumatic.  Pulmonary:     Effort: No respiratory distress.  Skin:    Coloration: Skin is not jaundiced or pale.  Neurological:     Mental Status: He is alert and oriented to person, place, and time.  Psychiatric:        Behavior: Behavior normal.     Procedures  Procedures  ED Course / MDM   Clinical Course as of 10/23/23 0302  Tue Oct 22, 2023  2346 Coming from [facility]. Scrotal abscess, spontaneously draining. No perirectal, perianal. Isolated to scrotum. Tachy to 150, no fever, elevated lactic. No white count. PCP visit last week, reported unintentional weight loss. Ordered chest ct, never went. IV fluids, vanc. Hx of HD perhaps [CG]  Wed Oct 23, 2023  0106 No abdominal tenderness, suspect transaminitis due to alcoholic hepatitis.  Does have kidney stone however denies any dysuria or flank pain. [CG]  0252 Moderate right hydronephrosis with associated 12 mm proximal right ureteral calculus at the L3-L4 level. 2. Mild scrotal wall thickening/edema without discrete abscess on CT. 3. Diffuse sclerotic metastases throughout the visualized axial and appendicular skeleton. Correlate for history of malignancy, presumably prostate CA. 4. Subpleural micronodularity measuring up to 4 mm in the lingula. In the setting of suspected primary malignancy, Fleischner Society guidelines do not apply. Consider follow-up CT chest as clinically warranted.   [CG]  0252 No pulmonary embolism. 2. 8.3 cm left breast mass with overlying cutaneous thickening, suspicious for male breast cancer. 3. Left axillary, left subpectoral, and subcarinal nodal metastases. 4.  Subpleural pulmonary nodularity, suspicious for metastases in this clinical setting. 5. Diffuse osseous metastases throughout the visualized axial and appendicular skeleton. 6. Trace bilateral pleural effusions.   [CG]    Clinical Course User Index [CG] Ruthell Lonni FALCON, PA-C   Medical Decision Making Amount and/or Complexity of Data Reviewed Labs: ordered. Radiology: ordered.  Risk Prescription drug management. Decision regarding hospitalization.   68 year old male signed out to me at shift change pending imaging, discussion for admission.  In short, 68 year old male presenting to the ED out of concern of scrotal abscess.  Was seen at urgent care and redirected to ED.  History of scrotal abscesses.  Initially patient was tachycardic to 150.  Patient lactic went from 2.4-2.9.  He had received IV vancomycin , IV fluids and his pulse rate normalized.  Patient reports a 30 pound weight loss over the last 2 months which is unintentional.  He reports he has been eating and drinking appropriately.  The patient denies any abdominal pain.  He does report a significant drinking history and it appears that he has alcoholic Otitis on metabolic panel.  Acute hepatitis panel ordered at this time.  Patient lactic acid has reduced at 1.7.  Patient CT scan of his abdomen shows moderate hydronephrosis on the right with associated 12 mm proximal right ureteral stone.  The patient denies any dysuria or flank pain.  He is also noted to have mild scrotal wall thickening and edema without a discrete abscess on CT.  He  reports that his scrotal abscess spontaneously began draining upon arrival to the ED and he has been placed on vancomycin  for this.  He is also noted to have diffuse sclerotic metastases throughout his entire visualized axial and appendicular skeleton.  Presumably prostate cancer.  Also noted to have subpleural micronodularity measuring up to 4 mm in the lingula.  CT obtained at this time  for further investigation.  CTA shows no PE however does show 8.3 cm left breast mass with overlying cutaneous thickening suspicious for male breast cancer.  Also noted to have left axillary, left subpectoral, and subcarinal nodal metastases.  Patient also shows subpleural pulmonary nodularity suspicious metastases.  Diffuse osseous metastases also noted throughout entire visualized axial and appendicular skeleton.  Imaging findings discussed with patient at length.  Acute hepatitis panel drawn at this time.  Patient has been admitted to the family medicine teaching service for further care, IV antibiotics and discussion with oncology.  Patient amenable to plan.           Ruthell Lonni FALCON, PA-C 10/23/23 0302    Trine Raynell Moder, MD 10/23/23 [phone]

## 2023-10-23 NOTE — Assessment & Plan Note (Addendum)
 Patient with several weeks of indurated, tunneled, draining axillary lesions concerning for HS.  Potentially lymphadenopathy from metastatic cancer. - Monitor symptom progression with IV antibiotic management

## 2023-10-23 NOTE — Care Plan (Addendum)
 The RN and ED secretary attempted to page the team regarding a critical hemoglobin result (6.6 g/dL); however, the pages were not successfully received. Both the RN and the secretary are aware of the issue. A stat repeat CBC has been ordered, and the plan is to proceed with transfusion if the hemoglobin remains below 7 g/dL, consistent with the patient's transfusion threshold. Patient handed off to Dr. Suzann Daring to follow up on results and post-transfusion CBC.

## 2023-10-23 NOTE — Assessment & Plan Note (Addendum)
 Hemoglobin 6.6. Repeat 6.7.  1 unit PRBC ordered. - Will monitor with posttransfusion H&H  - AM CBC

## 2023-10-23 NOTE — ED Notes (Signed)
 Paged Anders, MD for critical hgb result. Waiting for callback

## 2023-10-23 NOTE — Assessment & Plan Note (Addendum)
 Diffuse bony, nodal, and breast metastases based on CT findings, concerning for primary prostate cancer.  40 pounds of unintentional weight loss in the last several years.  Not up-to-date on colonoscopy.  Also significant smoking history. Alk phos likely 2/2 bone turnover. Low cf cauda equina given pt is able to urinate at bedside w/o issue, LE neurovascularly intact.  - Consult oncology in the morning - Pain management: Scheduled Tylenol  1000 mg every 6 hours, oxycodone  5 mg every 4 hours as needed - Scheduled bowel regimen with MiraLAX and senna once daily - RD consult

## 2023-10-23 NOTE — Consult Note (Signed)
 Urology Consult Note   Requesting Attending Physician:  Delores Suzann HERO, MD Service Providing Consult: Urology  Consulting Attending: Dr. Roseann   Reason for Consult:  scrotal abscess, right proximal ureteral stone  HPI: Larry Houston is seen in consultation for reasons noted above at the request of Delores Suzann HERO, MD. Patient is a 68 y.o. male presenting to [facility] emergency department with right side back pain as well as scrotal swelling and pain times about 1 week.  Patient reports that known abscess at the base of the scrotum opened up yesterday and began draining purulent fluid.  This has happened several times before.   CT A/P during emergency department workup notes 12 mm proximal right ureteral stone.  [facility] has been consulted to speak to these findings.  On my arrival patient was alert, oriented, in no distress.  We reviewed the case and plan and all of his questions were answered to his satisfaction.  He reports that he has had a few of the scrotal abscesses which have eventually drained on their own.He denies previously having a kidney stone.    ------------------  Assessment:   68 y.o. male with right proximal ureteral stone and draining abscess at base of scrotum.   Recommendations: # Scrotal abscess # Right ureteral stone  Patient carries previous diagnosis of hidradenitis suppurativa, though there does not appear to be any associated lesions at the base of the scrotum.  In advance of pending surgery patient was provided with a diet and Lovenox .  Please make him n.p.o. overnight and hold blood thinners in favor of SCDs.  To the OR with Dr. Roseann tentatively around 2 PM tomorrow for: cystoscopy with right retrograde pyelogram, possible ureteroscopy with right laser lithotripsy, and right ureteral stent placement.  Incision and drainage of scrotal abscess  Continue broad ABX.  Urology will follow  Case and plan discussed with Dr.  Roseann  Past Medical History: Past Medical History:  Diagnosis Date   Alcohol  abuse    Arthritis    lumbar stenosis    Hypertension    Scrotal abscess    Stroke [facility])    Venous malformation 09/03/2016   Right lower extremity venous malformation    Past Surgical History:  Past Surgical History:  Procedure Laterality Date   IR RADIOLOGIST EVAL & MGMT  04/05/2016   IR RADIOLOGIST EVAL & MGMT  10/17/2016   IR RADIOLOGIST EVAL & MGMT  12/05/2016   IR SCLEROTHERAPY OF A FLUID COLLECTION Right 09/13/2016   Venous malformation [Q27.9]   RLE   IR SCLEROTHERAPY OF A FLUID COLLECTION  09/13/2016   IR US  GUIDE VASC ACCESS RIGHT  09/13/2016   LUMBAR LAMINECTOMY/DECOMPRESSION MICRODISCECTOMY Bilateral 01/26/2015   Procedure: Laminectomy and Foraminotomy - L4-L5 - bilateral;  Surgeon: Alm GORMAN Molt, MD;  Location: [facility];  Service: Neurosurgery;  Laterality: Bilateral;  Laminectomy and Foraminotomy - L4-L5 - bilateral   NASAL ENDOSCOPY WITH EPISTAXIS CONTROL N/A 04/04/2014   Procedure: NASAL ENDOSCOPY WITH EPISTAXIS CONTROL;  Surgeon: Merilee Kraft, MD;  Location: [facility] OR;  Service: ENT;  Laterality: N/A;   NECK SURGERY  1992   posterior fusion- cervical    RADIOLOGY WITH ANESTHESIA N/A 09/13/2016   Procedure: percutaneous sclerotherapy with dehydrated ethanol;  Surgeon: Karalee Beat, MD;  Location: [facility] OR;  Service: Radiology;  Laterality: N/A;    Medication: Current Facility-Administered Medications  Medication Dose Route Frequency Provider Last Rate Last Admin   acetaminophen  (TYLENOL ) tablet 1,000 mg  1,000  mg Oral Q6H Treadwell Smucker, Reagan, MD   1,000 mg at 10/23/23 0940   Or   acetaminophen  (TYLENOL ) suppository 650 mg  650 mg Rectal Q6H Alena Morrison, Reagan, MD       atorvastatin  (LIPITOR ) tablet 80 mg  80 mg Oral Daily Bhagat, Virali, DO   80 mg at 10/23/23 1220   ceFEPIme (MAXIPIME) 2 g in sodium chloride  0.9 % 100 mL IVPB  2 g Intravenous Q8H Bryk, Veronda P, RPH 200  mL/hr at 10/23/23 1227 2 g at 10/23/23 1227   enoxaparin  (LOVENOX ) injection 40 mg  40 mg Subcutaneous Q24H Alena Morrison, Reagan, MD       folic acid  (FOLVITE ) tablet 1 mg  1 mg Oral Daily Treadwell Smucker, Reagan, MD   1 mg at 10/23/23 0941   insulin aspart (novoLOG) injection 0-9 Units  0-9 Units Subcutaneous TID WC Bhagat, Virali, DO       metroNIDAZOLE  (FLAGYL ) tablet 500 mg  500 mg Oral Q12H Bhagat, Virali, DO   500 mg at 10/23/23 1220   multivitamin with minerals tablet 1 tablet  1 tablet Oral Daily Alena Morrison, Reagan, MD   1 tablet at 10/23/23 0941   nicotine (NICODERM CQ - dosed in mg/24 hours) patch 14 mg  14 mg Transdermal Daily Zheng, Jacky, MD   14 mg at 10/23/23 0941   oxyCODONE  (Oxy IR/ROXICODONE ) immediate release tablet 5 mg  5 mg Oral Q4H PRN Alena Morrison, Reagan, MD   5 mg at 10/23/23 0452   polyethylene glycol (MIRALAX / GLYCOLAX) packet 17 g  17 g Oral Daily Alena Morrison, Reagan, MD   17 g at 10/23/23 0940   senna (SENOKOT) tablet 8.6 mg  1 tablet Oral Daily Alena Morrison, Reagan, MD   8.6 mg at 10/23/23 0941   thiamine  (VITAMIN B1) tablet 100 mg  100 mg Oral Daily Treadwell Smucker, Reagan, MD   100 mg at 10/23/23 0940   Or   thiamine  (VITAMIN B1) injection 100 mg  100 mg Intravenous Daily Alena Morrison, Reagan, MD       vancomycin  (VANCOREADY) IVPB 750 mg/150 mL  750 mg Intravenous Q12H Mattie Marvetta SQUIBB, RPH        Allergies: Allergies  Allergen Reactions   Penicillins Hives    Has patient had a PCN reaction causing immediate rash, facial/tongue/throat swelling, SOB or lightheadedness with hypotension: No Has patient had a PCN reaction causing severe rash involving mucus membranes or skin necrosis: No Has patient had a PCN reaction that required hospitalization No Has patient had a PCN reaction occurring within the last 10 years: No If all of the above answers are NO, then may proceed with Cephalosporin use.    Social History: Social  History   Tobacco Use   Smoking status: Former    Current packs/day: 0.00    Types: Cigarettes    Start date: 01/14/1999    Quit date: 01/13/2006    Years since quitting: 17.7   Smokeless tobacco: Never  Vaping Use   Vaping status: Never Used  Substance Use Topics   Alcohol  use: Yes    Comment: occasional    Drug use: No    Family History Family History  Problem Relation Age of Onset   Diabetes Mother    Hypertension Mother    Anemia Mother        Died of aplastic anemia   Lung disease Father    Heart attack Brother    Heart disease Sister  One sister had heart arrest    Review of Systems  Genitourinary:  Positive for flank pain. Negative for dysuria, frequency, hematuria and urgency.     Objective   Vital signs in last 24 hours: BP (!) 148/79   Pulse 79   Temp 97.7 F (36.5 C) (Oral)   Resp 20   Ht 6' (1.829 m)   Wt 81.6 kg   SpO2 99%   BMI 24.41 kg/m   Physical Exam General: A&O, resting, appropriate HEENT: [place]/AT Pulmonary: Normal work of breathing Cardiovascular: no cyanosis Abdomen: Soft, NTTP, nondistended GU: Scrotum with small draining sinus with gas and purulent fluid at very base of scrotum, nearing perineum.  Otherwise unremarkable.   Most Recent Labs: Lab Results  Component Value Date   WBC 7.8 10/23/2023   HGB 6.7 (LL) 10/23/2023   HCT 21.3 (L) 10/23/2023   PLT 97 (L) 10/23/2023    Lab Results  Component Value Date   NA 139 10/23/2023   K 3.1 (L) 10/23/2023   CL 105 10/23/2023   CO2 25 10/23/2023   BUN 10 10/23/2023   CREATININE 1.12 10/23/2023   CALCIUM  8.6 (L) 10/23/2023    Lab Results  Component Value Date   INR 1.0 07/22/2019   APTT 28 07/22/2019     Urine Culture: @[id number] (laburin,org,[id number],[id number])@   IMAGING: CT Angio Chest PE W and/or Wo Contrast Result Date: 10/23/2023 EXAM: CTA of the Chest with contrast for PE 10/23/2023 12:28:06 AM TECHNIQUE: CTA of the chest was performed without and with the  administration of 60 mL of intravenous contrast (iohexol  (OMNIPAQUE ) 350 MG/ML injection 75 mL IOHEXOL  350 MG/ML SOLN). Multiplanar reformatted images are provided for review. MIP images are provided for review. Automated exposure control, iterative reconstruction, and/or weight based adjustment of the mA/kV was utilized to reduce the radiation dose to as low as reasonably achievable. COMPARISON: None available. CLINICAL HISTORY: Pulmonary embolism (PE) suspected, high prob. F/u from CT abdomen/pelvis w/ suspicious findings; Abscess; 60 ml omni 350. FINDINGS: PULMONARY ARTERIES: Pulmonary arteries are adequately opacified for evaluation. No pulmonary embolism. Main pulmonary artery is normal in caliber. MEDIASTINUM: The heart and pericardium demonstrate no acute abnormality. There is no acute abnormality of the thoracic aorta. LYMPH NODES: Left axillary nodal metastases measuring up to 2.0 cm (image 42). 13 mm short axis left subpectoral nodal metastasis (image 32). 13 mm short axis subcarinal node (image 69). LUNGS AND PLEURA: Mild interlobar septal thickening with subpleural nodularity in the lungs bilaterally, measuring up to 4 mm in the lingula (image 90). This is nonspecific but suspicious for perilymphatic tumor spread in this clinical setting. Trace bilateral pleural effusions, left greater than right. No pneumothorax. UPPER ABDOMEN: Limited images of the upper abdomen are unremarkable. SOFT TISSUES AND BONES: 8.3 x 3.8 cm left breast mass with overlying cutaneous thickening (image 81), suspicious for male breast cancer. Diffuse mixed sclerotic metastases throughout the visualized axial and appendicular skeleton. IMPRESSION: 1. No pulmonary embolism. 2. 8.3 cm left breast mass with overlying cutaneous thickening, suspicious for male breast cancer. 3. Left axillary, left subpectoral, and subcarinal nodal metastases. 4. Subpleural pulmonary nodularity, suspicious for metastases in this clinical setting. 5.  Diffuse osseous metastases throughout the visualized axial and appendicular skeleton. 6. Trace bilateral pleural effusions. Electronically signed by: Pinkie Pebbles MD 10/23/2023 12:36 AM EDT RP Workstation: [id number]   CT ABDOMEN PELVIS W CONTRAST Result Date: 10/22/2023 EXAM: CT ABDOMEN AND PELVIS WITH CONTRAST 10/22/2023 11:38:08 PM TECHNIQUE: CT of the abdomen and pelvis was  performed with the administration of 75 mL of intravenous contrast (iohexol  (OMNIPAQUE ) 350 MG/ML injection 75 mL IOHEXOL  350 MG/ML SOLN). Multiplanar reformatted images are provided for review. Automated exposure control, iterative reconstruction, and/or weight-based adjustment of the mA/kV was utilized to reduce the radiation dose to as low as reasonably achievable. COMPARISON: None available. CLINICAL HISTORY: Concern for scrotal abscess, elevated LFT's. Pt complains of scrotal perineal abscess seen at urgent care sent in for further evaluation. FINDINGS: LOWER CHEST: Subpleural micronodularity in the lungs bilaterally, including a dominant 4 mm lingular nodule (image 1), indeterminate. Trace left pleural effusion. LIVER: Subcentimeter cyst in the left hepatic lobe (image 18), benign. GALLBLADDER AND BILE DUCTS: Gallbladder is unremarkable. No biliary ductal dilatation. SPLEEN: No acute abnormality. PANCREAS: No acute abnormality. ADRENAL GLANDS: No acute abnormality. KIDNEYS, URETERS AND BLADDER: Moderate right hydronephrosis with associated 12 mm proximal right ureteral calculus at the L3-L4 level. No stones in the left kidney or left ureter. No perinephric or periureteral stranding. Mildly thick-walled bladder, although underdistended. GI AND BOWEL: Stomach demonstrates no acute abnormality. Normal appendix (image 52). There is no bowel obstruction. PERITONEUM AND RETROPERITONEUM: No ascites. No free air. VASCULATURE: Aorta is normal in caliber. Atherosclerotic calcifications of the abdominal aorta and branch vessels, although  patent. LYMPH NODES: No lymphadenopathy. REPRODUCTIVE ORGANS: Prostatomegaly. Mild scrotal wall thickening/edema. No discrete abscess on CT. BONES AND SOFT TISSUES: Tiny fat-containing bilateral inguinal hernias. Diffuse sclerotic metastases throughout the visualized axial and appendicular skeleton. IMPRESSION: 1. Moderate right hydronephrosis with associated 12 mm proximal right ureteral calculus at the L3-L4 level. 2. Mild scrotal wall thickening/edema without discrete abscess on CT. 3. Diffuse sclerotic metastases throughout the visualized axial and appendicular skeleton. Correlate for history of malignancy, presumably prostate CA. 4. Subpleural micronodularity measuring up to 4 mm in the lingula. In the setting of suspected primary malignancy, Fleischner Society guidelines do not apply. Consider follow-up CT chest as clinically warranted. Electronically signed by: Pinkie Pebbles MD 10/22/2023 11:52 PM EDT RP Workstation: [id number]    ------  Ole Bourdon, NP Pager: [phone]   Please contact the urology consult pager with any further questions/concerns.

## 2023-10-23 NOTE — Assessment & Plan Note (Signed)
 Chronic alcohol  use, 3 drinks a day.  Last drink yesterday.  No history of withdrawal or seizures.  AST/ALT elevations suspected due to to alcoholic hepatitis. -CIWA protocol -Nutritional supplements including thiamine  -Will follow acute hepatitis panel - AM CMP

## 2023-10-23 NOTE — Consult Note (Signed)
 WOC Nurse Consult Note: Reason for Consult: Skin thickening to bilateral axillae and infectious lesion to perineal and scrotal area.  History perirectal abscess.  No definitive history hidradenitis suppurativa.  Was lost to follow up after last abscess.  Today, CT reveals 8.3 cm left breast mass with overlying cutaneous thickening suspicious for male breast cancer. Also noted to have left axillary, left subpectoral, and subcarinal nodal metastases.  Wound type: infectious to perineum Pressure Injury POA: NA Measurement: induration/erythema to distal scrotal/perineal area  3 cm x 4 cm with purulent effluent.  Bilateral axillae with skin thickening, no drainage noted.  Left nipple with solid mass noted Wound bed: not visible, narrow opening, cannot appreciate depth to scrotal wound Drainage (amount, consistency, odor) Purulent creamy effluent to perineal wound.  None noted to axillae.  No nipple discharge.  Periwound: skin thickening /possible scarring to axillae from previous ulcerations Warmth and induration to perineal area Dressing procedure/placement/frequency: Cleanse skin to bilateral axillae and perineal (scrotal) wounds with VASHE (LAWSON # [id number]) and allow to air dry.  Apply Aquacel (Lawson [id number]) to open/draining areas (arm pit or scrotum).  Cover with dry gauze and kerlix to secure (around scrotum or shoulder) change daily.  Will not follow at this time.  Please re-consult if needed.  Darice Cooley MSN, RN, FNP-BC CWON Wound, Ostomy, Continence Nurse Outpatient [place] [phone] Work cell phone:  [phone]       CT obtained at this time for further investigation. CTA shows no PE however does show 8.3 cm left breast mass with overlying cutaneous thickening suspicious for male breast cancer. Also noted to have left axillary, left subpectoral, and subcarinal nodal metastases.

## 2023-10-23 NOTE — ED Notes (Incomplete)
 DR Anders schooling paged eariler this morning ,

## 2023-10-23 NOTE — Plan of Care (Signed)
  Problem: Fluid Volume: Goal: Ability to maintain a balanced intake and output will improve Outcome: Progressing   Problem: Nutritional: Goal: Maintenance of adequate nutrition will improve Outcome: Progressing   Problem: Nutrition: Goal: Adequate nutrition will be maintained Outcome: Progressing   Problem: Elimination: Goal: Will not experience complications related to urinary retention Outcome: Progressing

## 2023-10-23 NOTE — Assessment & Plan Note (Signed)
 Patient with several weeks of indurated, tunneled, draining axillary lesions concerning for HS - Monitor symptom progression with IV antibiotic management

## 2023-10-24 ENCOUNTER — Inpatient Hospital Stay (HOSPITAL_COMMUNITY)

## 2023-10-24 ENCOUNTER — Ambulatory Visit

## 2023-10-24 ENCOUNTER — Inpatient Hospital Stay (HOSPITAL_COMMUNITY): Admitting: Anesthesiology

## 2023-10-24 ENCOUNTER — Encounter (HOSPITAL_COMMUNITY)
Admission: EM | Disposition: A | Discharge status: Home / Self Care | Payer: Self-pay | Source: Home / Self Care | Attending: Family Medicine

## 2023-10-24 ENCOUNTER — Encounter (HOSPITAL_COMMUNITY): Payer: Self-pay

## 2023-10-24 DIAGNOSIS — L02215 Cutaneous abscess of perineum: Secondary | ICD-10-CM

## 2023-10-24 DIAGNOSIS — I10 Essential (primary) hypertension: Secondary | ICD-10-CM

## 2023-10-24 DIAGNOSIS — N492 Inflammatory disorders of scrotum: Secondary | ICD-10-CM | POA: Diagnosis not present

## 2023-10-24 DIAGNOSIS — N201 Calculus of ureter: Secondary | ICD-10-CM

## 2023-10-24 DIAGNOSIS — E119 Type 2 diabetes mellitus without complications: Secondary | ICD-10-CM

## 2023-10-24 DIAGNOSIS — F17210 Nicotine dependence, cigarettes, uncomplicated: Secondary | ICD-10-CM

## 2023-10-24 DIAGNOSIS — E43 Unspecified severe protein-calorie malnutrition: Secondary | ICD-10-CM | POA: Insufficient documentation

## 2023-10-24 HISTORY — PX: SCROTAL EXPLORATION: SHX2386

## 2023-10-24 HISTORY — PX: CYSTOSCOPY W/ URETERAL STENT PLACEMENT: SHX1429

## 2023-10-24 LAB — COMPREHENSIVE METABOLIC PANEL WITH GFR
ALT: 226 U/L — ABNORMAL HIGH (ref 0–44)
AST: 298 U/L — ABNORMAL HIGH (ref 15–41)
Albumin: 2.7 g/dL — ABNORMAL LOW (ref 3.5–5.0)
Alkaline Phosphatase: 487 U/L — ABNORMAL HIGH (ref 38–126)
Anion gap: 11 (ref 5–15)
BUN: 10 mg/dL (ref 8–23)
CO2: 24 mmol/L (ref 22–32)
Calcium: 8.8 mg/dL — ABNORMAL LOW (ref 8.9–10.3)
Chloride: 103 mmol/L (ref 98–111)
Creatinine, Ser: 0.98 mg/dL (ref 0.61–1.24)
GFR, Estimated: >60 mL/min (ref >60)
Glucose, Bld: 107 mg/dL — ABNORMAL HIGH (ref 70–99)
Potassium: 3.6 mmol/L (ref 3.5–5.1)
Sodium: 138 mmol/L (ref 135–145)
Total Bilirubin: 1.8 mg/dL — ABNORMAL HIGH (ref 0.0–1.2)
Total Protein: 7.1 g/dL (ref 6.5–8.1)

## 2023-10-24 LAB — TYPE AND SCREEN
ABO/RH(D): B POS
Antibody Screen: NEGATIVE
Unit division: 0

## 2023-10-24 LAB — POCT I-STAT, CHEM 8
BUN: 11 mg/dL (ref 8–23)
Calcium, Ion: 1.11 mmol/L — ABNORMAL LOW (ref 1.15–1.40)
Chloride: 101 mmol/L (ref 98–111)
Creatinine, Ser: 1.2 mg/dL (ref 0.61–1.24)
Glucose, Bld: 118 mg/dL — ABNORMAL HIGH (ref 70–99)
HCT: 21 % — ABNORMAL LOW (ref 39.0–52.0)
Hemoglobin: 7.1 g/dL — ABNORMAL LOW (ref 13.0–17.0)
Potassium: 3.5 mmol/L (ref 3.5–5.1)
Sodium: 142 mmol/L (ref 135–145)
TCO2: 25 mmol/L (ref 22–32)

## 2023-10-24 LAB — BPAM RBC
Blood Product Expiration Date: 202511112359
ISSUE DATE / TIME: 202510150936
Unit Type and Rh: 7300

## 2023-10-24 LAB — GC/CHLAMYDIA PROBE AMP (~~LOC~~) NOT AT ARMC
Chlamydia: NEGATIVE
Comment: NEGATIVE
Comment: NORMAL
Neisseria Gonorrhea: NEGATIVE

## 2023-10-24 LAB — GLUCOSE, CAPILLARY
Glucose-Capillary: 108 mg/dL — ABNORMAL HIGH (ref 70–99)
Glucose-Capillary: 106 mg/dL — ABNORMAL HIGH (ref 70–99)
Glucose-Capillary: 115 mg/dL — ABNORMAL HIGH (ref 70–99)
Glucose-Capillary: 202 mg/dL — ABNORMAL HIGH (ref 70–99)

## 2023-10-24 LAB — BASIC METABOLIC PANEL WITH GFR
Anion gap: 12 (ref 5–15)
BUN: 10 mg/dL (ref 8–23)
CO2: 25 mmol/L (ref 22–32)
Calcium: 9.1 mg/dL (ref 8.9–10.3)
Chloride: 101 mmol/L (ref 98–111)
Creatinine, Ser: 1.02 mg/dL (ref 0.61–1.24)
GFR, Estimated: >60 mL/min (ref >60)
Glucose, Bld: 112 mg/dL — ABNORMAL HIGH (ref 70–99)
Potassium: 3.9 mmol/L (ref 3.5–5.1)
Sodium: 138 mmol/L (ref 135–145)

## 2023-10-24 LAB — CBC
HCT: 27.7 % — ABNORMAL LOW (ref 39.0–52.0)
Hemoglobin: 9.3 g/dL — ABNORMAL LOW (ref 13.0–17.0)
MCH: 30.5 pg (ref 26.0–34.0)
MCHC: 33.6 g/dL (ref 30.0–36.0)
MCV: 90.8 fL (ref 80.0–100.0)
Platelets: 95 K/uL — ABNORMAL LOW (ref 150–400)
RBC: 3.05 MIL/uL — ABNORMAL LOW (ref 4.22–5.81)
RDW: 20.8 % — ABNORMAL HIGH (ref 11.5–15.5)
WBC: 6.5 K/uL (ref 4.0–10.5)
nRBC: 2.1 % — ABNORMAL HIGH (ref 0.0–0.2)

## 2023-10-24 LAB — KAPPA/LAMBDA LIGHT CHAINS
Kappa free light chain: 70.9 mg/L — ABNORMAL HIGH (ref 3.3–19.4)
Kappa, lambda light chain ratio: 1.15 (ref 0.26–1.65)
Lambda free light chains: 61.6 mg/L — ABNORMAL HIGH (ref 5.7–26.3)

## 2023-10-24 SURGERY — CYSTOSCOPY, WITH RETROGRADE PYELOGRAM AND URETERAL STENT INSERTION
Anesthesia: General | Laterality: Right

## 2023-10-24 MED ORDER — FENTANYL CITRATE (PF) 250 MCG/5ML IJ SOLN
INTRAMUSCULAR | Status: AC
  Filled 2023-10-24: qty 5

## 2023-10-24 MED ORDER — MIDAZOLAM HCL 2 MG/2ML IJ SOLN
INTRAMUSCULAR | Status: AC
  Filled 2023-10-24: qty 2

## 2023-10-24 MED ORDER — DEXAMETHASONE SOD PHOSPHATE PF 10 MG/ML IJ SOLN
INTRAMUSCULAR | Status: DC | PRN
  Administered 2023-10-24: 5 mg via INTRAVENOUS

## 2023-10-24 MED ORDER — SODIUM CHLORIDE 0.9 % IR SOLN
Status: DC | PRN
  Administered 2023-10-24: 3000 mL

## 2023-10-24 MED ORDER — ACETAMINOPHEN 10 MG/ML IV SOLN
1000.0000 mg | Freq: Four times a day (QID) | INTRAVENOUS | Status: DC
  Filled 2023-10-24: qty 100

## 2023-10-24 MED ORDER — PROPOFOL 10 MG/ML IV BOLUS
INTRAVENOUS | Status: DC | PRN
  Administered 2023-10-24: 120 mg via INTRAVENOUS

## 2023-10-24 MED ORDER — PHENYLEPHRINE HCL-NACL 20-0.9 MG/250ML-% IV SOLN
INTRAVENOUS | Status: DC | PRN
  Administered 2023-10-24: 25 ug/min via INTRAVENOUS

## 2023-10-24 MED ORDER — DEXMEDETOMIDINE HCL IN NACL 80 MCG/20ML IV SOLN
INTRAVENOUS | Status: DC | PRN
  Administered 2023-10-24: 12 ug via INTRAVENOUS

## 2023-10-24 MED ORDER — ONDANSETRON HCL 4 MG/2ML IJ SOLN
INTRAMUSCULAR | Status: DC | PRN
  Administered 2023-10-24: 4 mg via INTRAVENOUS

## 2023-10-24 MED ORDER — METOCLOPRAMIDE HCL 5 MG/ML IJ SOLN
5.0000 mg | Freq: Three times a day (TID) | INTRAMUSCULAR | Status: DC | PRN
  Administered 2023-10-24: 5 mg via INTRAVENOUS
  Filled 2023-10-24: qty 2

## 2023-10-24 MED ORDER — FENTANYL CITRATE (PF) 100 MCG/2ML IJ SOLN: 25.0000 ug | INTRAMUSCULAR | Status: DC | PRN

## 2023-10-24 MED ORDER — MIDAZOLAM HCL (PF) 2 MG/2ML IJ SOLN
INTRAMUSCULAR | Status: DC | PRN
Start: 2023-10-24 — End: 2023-10-24
  Administered 2023-10-24: 2 mg via INTRAVENOUS

## 2023-10-24 MED ORDER — METRONIDAZOLE 500 MG/100ML IV SOLN
500.0000 mg | Freq: Two times a day (BID) | INTRAVENOUS | Status: DC
  Administered 2023-10-24 – 2023-10-25 (×2): 500 mg via INTRAVENOUS
  Filled 2023-10-24 (×2): qty 100

## 2023-10-24 MED ORDER — METOCLOPRAMIDE HCL 5 MG/ML IJ SOLN
5.0000 mg | Freq: Once | INTRAMUSCULAR | Status: AC
  Administered 2023-10-24: 5 mg via INTRAVENOUS
  Filled 2023-10-24: qty 2

## 2023-10-24 MED ORDER — SODIUM CHLORIDE FLUSH 0.9 % IV SOLN
INTRAVENOUS | Status: DC | PRN
  Administered 2023-10-24: 8.5 mL via INTRAVENOUS

## 2023-10-24 MED ORDER — LACTATED RINGERS IV SOLN: INTRAVENOUS | Status: DC

## 2023-10-24 MED ORDER — INSULIN ASPART 100 UNIT/ML IJ SOLN: 0.0000 [IU] | INTRAMUSCULAR | Status: DC | PRN

## 2023-10-24 MED ORDER — FENTANYL CITRATE (PF) 250 MCG/5ML IJ SOLN
INTRAMUSCULAR | Status: DC | PRN
  Administered 2023-10-24 (×2): 50 ug via INTRAVENOUS

## 2023-10-24 MED ORDER — IOHEXOL 300 MG/ML  SOLN
INTRAMUSCULAR | Status: DC | PRN
  Administered 2023-10-24: 8.5 mL

## 2023-10-24 MED ORDER — CHLORHEXIDINE GLUCONATE 0.12 % MT SOLN
15.0000 mL | Freq: Once | OROMUCOSAL | Status: AC
  Administered 2023-10-24: 15 mL via OROMUCOSAL
  Filled 2023-10-24: qty 15

## 2023-10-24 MED ORDER — 0.9 % SODIUM CHLORIDE (POUR BTL) OPTIME
TOPICAL | Status: DC | PRN
  Administered 2023-10-24: 1000 mL

## 2023-10-24 MED ORDER — LIDOCAINE 2% (20 MG/ML) 5 ML SYRINGE
INTRAMUSCULAR | Status: DC | PRN
  Administered 2023-10-24: 40 mg via INTRAVENOUS

## 2023-10-24 MED ORDER — ACETAMINOPHEN 10 MG/ML IV SOLN
1000.0000 mg | Freq: Four times a day (QID) | INTRAVENOUS | Status: AC
  Administered 2023-10-24 – 2023-10-25 (×3): 1000 mg via INTRAVENOUS
  Filled 2023-10-24 (×2): qty 100

## 2023-10-24 MED ORDER — ORAL CARE MOUTH RINSE: 15.0000 mL | Freq: Once | OROMUCOSAL | Status: AC

## 2023-10-24 MED ORDER — ROCURONIUM BROMIDE 10 MG/ML (PF) SYRINGE
PREFILLED_SYRINGE | INTRAVENOUS | Status: DC | PRN
  Administered 2023-10-24: 40 mg via INTRAVENOUS

## 2023-10-24 MED ORDER — SUGAMMADEX SODIUM 200 MG/2ML IV SOLN
INTRAVENOUS | Status: DC | PRN
  Administered 2023-10-24: 200 mg via INTRAVENOUS

## 2023-10-24 MED ORDER — FOLIC ACID 5 MG/ML IJ SOLN
1.0000 mg | Freq: Once | INTRAMUSCULAR | Status: AC
  Administered 2023-10-24: 1 mg via INTRAVENOUS
  Filled 2023-10-24: qty 0.2

## 2023-10-24 SURGICAL SUPPLY — 24 items
BAG COUNTER SPONGE SURGICOUNT (BAG) ×2 IMPLANT
BAG DRAIN URO-CYSTO SKYTR STRL (DRAIN) ×2 IMPLANT
BASKET LASER NITINOL 1.9FR (BASKET) IMPLANT
BLADE HEX COATED 2.75 (ELECTRODE) IMPLANT
CATH URET 5FR 28IN OPEN ENDED (CATHETERS) ×2 IMPLANT
CATH URETERAL DUAL LUMEN 10F (MISCELLANEOUS) IMPLANT
ELECTRODE REM PT RTRN 9FT ADLT (ELECTROSURGICAL) IMPLANT
EXTRACTOR STONE 1.7FRX115CM (UROLOGICAL SUPPLIES) IMPLANT
FIBER LASER TRACTIP 200 (UROLOGICAL SUPPLIES) IMPLANT
GAUZE PAD ABD 7.5X8 STRL (GAUZE/BANDAGES/DRESSINGS) IMPLANT
GLOVE BIO SURGEON STRL SZ7.5 (GLOVE) ×2 IMPLANT
GOWN STRL REUS W/ TWL XL LVL3 (GOWN DISPOSABLE) ×2 IMPLANT
GUIDEWIRE ANG ZIPWIRE 038X150 (WIRE) IMPLANT
GUIDEWIRE STR DUAL SENSOR (WIRE) ×2 IMPLANT
KIT TURNOVER KIT B (KITS) ×2 IMPLANT
MANIFOLD NEPTUNE II (INSTRUMENTS) ×2 IMPLANT
PACK CYSTO (CUSTOM PROCEDURE TRAY) ×2 IMPLANT
SET IRRIG Y TYPE TUR BLADDER L (SET/KITS/TRAYS/PACK) ×2 IMPLANT
SOL .9 NS 3000ML IRR UROMATIC (IV SOLUTION) ×4 IMPLANT
SOL PREP POV-IOD 4OZ 10% (MISCELLANEOUS) ×2 IMPLANT
SOLN 0.9% NACL 1000 ML (IV SOLUTION) ×2 IMPLANT
SOLN 0.9% NACL POUR BTL 1000ML (IV SOLUTION) ×2 IMPLANT
SYR 10ML LL (SYRINGE) IMPLANT
TUBE CONNECTING 12X1/4 (SUCTIONS) IMPLANT

## 2023-10-24 NOTE — Op Note (Signed)
 OPERATIVE NOTE   Patient Name: Larry Houston  MRN: [id number]   Date of Procedure: 10/24/23    Preoperative diagnosis:  Right ureteral calculus with obstruction Scrotal/perineal abscess  Postoperative diagnosis:  Right ureteral calculus with obstruction Scrotal/perineal abscess  Procedure:  Cystoscopy Right retrograde pyelogram with intra-operative interpretation Attempted placement of right ureteral stent Incision and drainage of perineal abscess  Attending: Adine DOROTHA Manly, MD  Anesthesia: General  Estimated blood loss: 10 ml  Fluids: Per anesthesia record  Drains: none  Specimens: none  Antibiotics: Patient currently on IV cefepime, metronidazole , and vancomycin   Findings:  - Lateral lobe enlargement of the prostate - Normal bladder with trabeculations and cellules - Right proximal ureteral calculus with dilation of the ureter and collecting system proximally - Unable to pass guidewire beyond stone despite multiple attempts  Indications:  68 year old male who presents for surgical management of a right ureteral calculus and scrotal/perineal abscess.  He presented to the emergency room yesterday with right sided back pain and some scrotal swelling and pain x 1 week.  He reports a abscess at the base of the scrotum began draining spontaneously.  CT imaging showed a 12 mm proximal right ureteral calculus with obstruction.  His creatinine has been normal.  He is on IV antibiotics with vancomycin , Flagyl , and cefepime.  He presents now for cystoscopy, right retrograde pyelogram, insertion of right ureteral stent, and incision and drainage of scrotal/perineal abscess.  The procedure including potential risk discussed with the patient.  He understands wishes to proceed as described.  Description of Procedure:  The patient was on IV antibiotics preoperatively.  He was taken to the operating room suite and properly identified.  After successful induction of a general  anesthetic, the patient was placed in the dorsolithotomy position.  The patient's genital areas was prepped and draped in sterile fashion.  A preoperative timeout was performed.  Examination of the scrotum and perineum demonstrated an area of induration with an opening draining purulent fluid in the perineum extending towards the base of the scrotum. Scout film showed a nonspecific bowel gas pattern with no obvious bony abnormalities.  A 9 x 12 mm calcification was seen in the right upper abdomen consistent with the known right ureteral calculus. Under direct visualization a 23 French rigid scope was passed through the urethra and into the bladder.  The patient was noted to have lateral lobe enlargement of the prostate with some elevation of the bladder neck.  The bladder was inspected throughout its entirety.  The patient was noted to have trabeculations with cellules.  No mucosal lesions were seen.  A single orifice was noted bilaterally.  A Glidewire was used to access the right ureter.  After passage of the wire into the mid ureter the 5 [place] open-ended catheter was passed over the Glidewire.  Injection of contrast demonstrated a normal distal and mid ureter.  The previously noted calcification was confirmed to be within the proximal right ureter.  Contrast was seen proximal to the calculus filling a dilated ureter and collecting system.  The open-ended catheter was again placed over the guidewire.  Attempts at passage of the Glidewire beyond the stone were unsuccessful.  I attempted passage of a sensor wire also without success.  Injection of contrast through the open-ended catheter demonstrated no extravasation.  After multiple attempts at manipulating the wire beyond the stone, I elected to discontinue this portion of the procedure.  The wire, ureteral catheter, and cystoscope were removed. Attention was then turned  to the abscess in the perineal area.  This was opened longitudinally.  A small amount of  fluid drained.  The wound was probed with my fingertip and no remaining pockets of fluid were identified.  The wound was irrigated with normal saline.  Hemostasis was obtained with the cautery.  The wound was then packed with iodoform gauze.  An ABD was applied.  The patient was then extubated and taken to the post anesthesia care unit in stable condition.  Complications: None  Condition: Stable, extubated, transferred to PACU  Plan:  Placement of right percutaneous nephrostomy tube by interventional radiology for decompression of the obstructed right kidney. Dressing changes to begin tomorrow with iodoform packing. Will need outpatient follow-up to arrange for definitive treatment of the right ureteral calculus.

## 2023-10-24 NOTE — Progress Notes (Addendum)
 Daily Progress Note Intern Pager: [phone]  Patient name: Larry Houston Medical record number: [id number] Date of birth: 1955-06-07 Age: 68 y.o. Gender: male  Primary Care Provider: Larraine Palma, MD Consultants: Urology, Oncology  Code Status: DNR/DNI   [name]   Medical Decision Making:  Larry Houston is a 68 y.o. male with a PMH of HTN, chronic alcohol  use, and chronic back pain who presented with scrotal pain and drainage. Patient with unintentional weight loss, back pain, fecal and urinary incontinence found to have diffuse metastatic cancer. Oncology consulted, appreciate their care and recommendations. Patient also with 12 mm right uretal stone. Urology consulted and taking patient to the OR today, appreciate their care and recommendations.  Assessment & Plan Scrotal abscess Patient with spontaneously draining scrotal cellulitis.  No discrete abscess visualized on CT.  Recent fecal incontinence concerning for anaerobic contamination of wound. NPO today for surgery by urology.  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  Potentially breast versus prostate versus multiple myeloma. Unable to do breast US  inpatient. PSA 4.31.  - Oncology consulted, appreciate recommendations  - Dr. Loretha will be seeing patient today - Pain management: Scheduled Tylenol  1000 mg every 6 hours, oxycodone  5 mg every 4 hours as needed.  Tylenol  IV 1000 mg every 6 hours while patient is NPO. - Scheduled bowel regimen with MiraLAX and senna once daily - RD consult, appreciate recommendations - Labs: Protein electrophoresis, kappa/lambda light chains Acute  anemia Hemoglobin 9.3, s/p 1 unit PRBCs yesterday.  - Will monitor  - AM CBC Hydronephrosis of right kidney Nephrolithiasis CTAP with R moderate hydronephrosis and 12 mm proximal right ureteral calculus at the level of L3-L4. Urology surgery today.  - Urology consulted as above, appreciate recommendations  - R retrograde pyelogram and R ureteral stent placement today Alcohol  use (HCC) Transaminitis Chronic use.  AST/ALT elevated likely secondary to alcohol  use.  Hepatitis panel nonreactive.  No known history of alcohol  withdrawal or seizures. -CIWA protocol -Nutritional supplements including thiamine  - AM CMP Hidradenitis suppurativa of multiple sites Patient with several weeks of indurated, tunneled, draining axillary lesions concerning for HS.  Potentially lymphadenopathy from metastatic cancer. - Monitor symptom progression with IV antibiotic management Chronic health problem HTN: amlodipine  5 mg every day  History of CVA: aspirin  81 mg every day, lipitor  80 mg every day  T2DM: last A1c on 09/26/23 7.1    FEN/GI: NPO pending surgery with urology  PPx: Lovenox   Dispo: Home pending clinical improvement.  Subjective:  Patient seen at bedside.  He states he is doing okay this morning.  He is having pain in his lower abdomen.  He is currently receiving IV Tylenol  and I told him to let us  know if that does not help his pain.  He is not thrilled about being n.p.o., but understands that is needed for the procedure.  States that scrotum is tender and he feels like more needs to come out off of it.  Objective: Temp:  [97.7 F (36.5 C)-98.3 F (36.8 C)] 98.3 F (36.8 C) (10/16 0544) Pulse Rate:  [77-95] 95 (10/16 0544) Resp:  [16-20] 16 (10/16 0544) BP: (  114-153)/(43-80) 139/76 (10/16 0544) SpO2:  [98 %-100 %] 100 % (10/16 0544) Physical Exam: General: NAD Cardiovascular: RRR, no M/R/G Respiratory: CTAB, normal work of breathing on room air Abdomen: Thin, soft, nondistended, no  tender to palpation in RLQ and LLQ Extremities: No edema  Laboratory: Most recent CBC Lab Results  Component Value Date   WBC 7.8 10/23/2023   HGB 9.1 (L) 10/23/2023   HCT 27.2 (L) 10/23/2023   MCV 95.9 10/23/2023   PLT 97 (L) 10/23/2023   Most recent BMP    Latest Ref Rng & Units 10/23/2023    4:53 AM  BMP  Glucose 70 - 99 mg/dL 776   BUN 8 - 23 mg/dL 10   Creatinine 9.38 - 1.24 mg/dL 8.87   Sodium 864 - 854 mmol/L 139   Potassium 3.5 - 5.1 mmol/L 3.1   Chloride 98 - 111 mmol/L 105   CO2 22 - 32 mmol/L 25   Calcium  8.9 - 10.3 mg/dL 8.6     Other pertinent labs: PSA 4.31,  ferritin 3,021, A1c 6.3  Imaging/Diagnostic Tests: CTA PE  1. No pulmonary embolism. 2. 8.3 cm left breast mass with overlying cutaneous thickening, suspicious for male breast cancer. 3. Left axillary, left subpectoral, and subcarinal nodal metastases. 4. Subpleural pulmonary nodularity, suspicious for metastases in this clinical setting. 5. Diffuse osseous metastases throughout the visualized axial and appendicular skeleton. 6. Trace bilateral pleural effusions.  US  Scrotum w/Doppler 1. No evidence of  abscess. 2. Right varicocele, 3.1 mm in diameter. 3. Left epididymal cystic lesion (likely minimally complex epididymal cyst or spermatocele), 1.1 x 1.0 x 1.0 cm. 4. Normal testicular arterial and venous flow bilaterally.  [name], DO 10/24/2023, 8:18 AM  PGY-1, [facility] FPTS Intern pager: [phone], text pages welcome Secure chat group [facility] Teaching Service

## 2023-10-24 NOTE — Assessment & Plan Note (Signed)
 Chronic use.  AST/ALT elevated likely secondary to alcohol  use.  Hepatitis panel nonreactive.  No known history of alcohol  withdrawal or seizures. -CIWA protocol -Nutritional supplements including thiamine  - AM CMP

## 2023-10-24 NOTE — Assessment & Plan Note (Addendum)
 CTAP with R moderate hydronephrosis and 12 mm proximal right ureteral calculus at the level of L3-L4. Urology surgery today.  - Urology consulted as above, appreciate recommendations  - R retrograde pyelogram and R ureteral stent placement today

## 2023-10-24 NOTE — Interval H&P Note (Signed)
 History and Physical Interval Note:  10/24/2023 2:08 PM  Larry Houston  has presented today for surgery, with the diagnosis of cellulitis scrotum.  The various methods of treatment have been discussed with the patient and family. After consideration of risks, benefits and other options for treatment, the patient has consented to  Procedure(s) with comments: CYSTOSCOPY, WITH RETROGRADE PYELOGRAM AND URETERAL STENT INSERTION (Right) - POSSIBLE LASER LIPOTRIPSY EXPLORATION, SCROTUM (N/A) - IRRIGATION AND DEBRIDEMENT SCROTUM ABSCESS as a surgical intervention.  The patient's history has been reviewed, patient examined, no change in status, stable for surgery.  I have reviewed the patient's chart and labs.  Questions were answered to the patient's satisfaction.     Adine Manly

## 2023-10-24 NOTE — Assessment & Plan Note (Addendum)
 Multiple metastases with unknown origin.  Potentially breast versus prostate versus multiple myeloma. Unable to do breast US  inpatient. PSA 4.31.  - Oncology consulted, appreciate recommendations  - Dr. Loretha will be seeing patient today - Pain management: Scheduled Tylenol  1000 mg every 6 hours, oxycodone  5 mg every 4 hours as needed.  Tylenol  IV 1000 mg every 6 hours while patient is NPO. - Scheduled bowel regimen with MiraLAX and senna once daily - RD consult, appreciate recommendations - Labs: Protein electrophoresis, kappa/lambda light chains

## 2023-10-24 NOTE — Plan of Care (Signed)
  Problem: Coping: Goal: Ability to adjust to condition or change in health will improve Outcome: Progressing   Problem: Coping: Goal: Level of anxiety will decrease Outcome: Progressing   Problem: Nutritional: Goal: Maintenance of adequate nutrition will improve Outcome: Not Progressing

## 2023-10-24 NOTE — H&P (View-Only) (Signed)
 Urology Inpatient Progress Note  Subjective: No complaints today. No flank pain.  Cr normal.  Anti-infectives: Anti-infectives (From admission, onward)    Start     Dose/Rate Route Frequency Ordered Stop   10/24/23 1215  [MAR Hold]  metroNIDAZOLE  (FLAGYL ) IVPB 500 mg        (MAR Hold since Thu 10/24/2023 at 1345.Hold Reason: Transfer to a Procedural area)   500 mg 100 mL/hr over 60 Minutes Intravenous Every 12 hours 10/24/23 1129     10/23/23 1100  metroNIDAZOLE  (FLAGYL ) tablet 500 mg  Status:  Discontinued        500 mg Oral Every 12 hours 10/23/23 1052 10/24/23 1129   10/23/23 1000  [MAR Hold]  vancomycin  (VANCOREADY) IVPB 750 mg/150 mL        (MAR Hold since Thu 10/24/2023 at 1345.Hold Reason: Transfer to a Procedural area)   750 mg 150 mL/hr over 60 Minutes Intravenous Every 12 hours 10/23/23 0415     10/23/23 0430  [MAR Hold]  ceFEPIme (MAXIPIME) 2 g in sodium chloride  0.9 % 100 mL IVPB        (MAR Hold since Thu 10/24/2023 at 1345.Hold Reason: Transfer to a Procedural area)   2 g 200 mL/hr over 30 Minutes Intravenous Every 8 hours 10/23/23 0415     10/22/23 2145  vancomycin  (VANCOCIN ) IVPB 1000 mg/200 mL premix  Status:  Discontinued        1,000 mg 200 mL/hr over 60 Minutes Intravenous  Once 10/22/23 2136 10/22/23 2137   10/22/23 2145  vancomycin  (VANCOREADY) IVPB 1500 mg/300 mL        1,500 mg 150 mL/hr over 120 Minutes Intravenous  Once 10/22/23 2137 10/23/23 0059       Current Facility-Administered Medications  Medication Dose Route Frequency Provider Last Rate Last Admin   [MAR Hold] acetaminophen  (OFIRMEV ) IV 1,000 mg  1,000 mg Intravenous Q6H Baker, Amelia G, DO 400 mL/hr at 10/24/23 0856 1,000 mg at 10/24/23 0856   [MAR Hold] atorvastatin  (LIPITOR ) tablet 80 mg  80 mg Oral Daily Bhagat, Virali, DO   80 mg at 10/23/23 1220   [MAR Hold] ceFEPIme (MAXIPIME) 2 g in sodium chloride  0.9 % 100 mL IVPB  2 g Intravenous Q8H Bryk, Veronda P, RPH 200 mL/hr at 10/24/23 1151 2 g  at 10/24/23 1151   [MAR Hold] feeding supplement (ENSURE PLUS HIGH PROTEIN) liquid 237 mL  237 mL Oral BID BM Brown, Carina M, MD   237 mL at 10/23/23 1700   [MAR Hold] folic acid  (FOLVITE ) tablet 1 mg  1 mg Oral Daily Treadwell Smucker, Reagan, MD   1 mg at 10/23/23 0941   insulin aspart (novoLOG) injection 0-14 Units  0-14 Units Subcutaneous Q2H PRN Epifanio Charleston, MD       [facility] Hold] insulin aspart (novoLOG) injection 0-9 Units  0-9 Units Subcutaneous TID WC Bhagat, Virali, DO   1 Units at 10/23/23 1659   lactated ringers  infusion   Intravenous Continuous Epifanio Fallow, MD 10 mL/hr at 10/24/23 1405 New Bag at 10/24/23 1405   [MAR Hold] metoCLOPramide (REGLAN) injection 5 mg  5 mg Intravenous Q8H PRN Miller, Samantha, DO       [MAR Hold] metroNIDAZOLE  (FLAGYL ) IVPB 500 mg  500 mg Intravenous Q12H Cleotilde Lukes, DO       [MAR Hold] multivitamin with minerals tablet 1 tablet  1 tablet Oral Daily Treadwell Smucker, Reagan, MD   1 tablet at 10/23/23 0941   [MAR Hold] nicotine (NICODERM CQ -  dosed in mg/24 hours) patch 14 mg  14 mg Transdermal Daily Zheng, Jacky, MD   14 mg at 10/24/23 0826   [MAR Hold] oxyCODONE  (Oxy IR/ROXICODONE ) immediate release tablet 5 mg  5 mg Oral Q4H PRN Alena Morrison, Reagan, MD   5 mg at 10/23/23 2018   [facility] Hold] polyethylene glycol (MIRALAX / GLYCOLAX) packet 17 g  17 g Oral Daily Alena Morrison, Reagan, MD   17 g at 10/23/23 0940   [MAR Hold] senna (SENOKOT) tablet 8.6 mg  1 tablet Oral Daily Alena Morrison, Reagan, MD   8.6 mg at 10/23/23 0941   [MAR Hold] thiamine  (VITAMIN B1) tablet 100 mg  100 mg Oral Daily Alena Morrison, Reagan, MD   100 mg at 10/23/23 0940   Or   [MAR Hold] thiamine  (VITAMIN B1) injection 100 mg  100 mg Intravenous Daily Alena Morrison, Reagan, MD   100 mg at 10/24/23 0827   [MAR Hold] vancomycin  (VANCOREADY) IVPB 750 mg/150 mL  750 mg Intravenous Q12H Mattie Marvetta SQUIBB, RPH 150 mL/hr at 10/24/23 1111 750 mg at 10/24/23  1111     Objective: Vital signs in last 24 hours: Temp:  [97.6 F (36.4 C)-98.3 F (36.8 C)] 97.6 F (36.4 C) (10/16 1343) Pulse Rate:  [83-95] 90 (10/16 1343) Resp:  [16-18] 18 (10/16 1343) BP: (134-164)/(71-85) 157/82 (10/16 1343) SpO2:  [98 %-100 %] 98 % (10/16 1343) Weight:  [79.8 kg] 79.8 kg (10/16 1343)  Intake/Output from previous day: 10/15 0701 - 10/16 0700 In: 869.8 [P.O.:476; Blood:393.8] Out: 1475 [Urine:1475] Intake/Output this shift: Total I/O In: 0  Out: 700 [Urine:700]  GENERAL APPEARANCE:  Well appearing, well developed, well nourished, NAD HEENT:  Atraumatic, normocephalic, oropharynx clear NECK:  Supple without lymphadenopathy or thyromegaly ABDOMEN:  Soft, non-tender, no masses EXTREMITIES:  Moves all extremities well, without clubbing, cyanosis, or edema NEUROLOGIC:  Alert and oriented x 3, normal gait, CN II-XII grossly intact MENTAL STATUS:  appropriate BACK:  Non-tender to palpation, No CVAT SKIN:  Warm, dry, and intact   Lab Results:  Recent Labs    10/23/23 0619 10/23/23 2016 10/24/23 0750  WBC 7.8  --  6.5  HGB 6.7* 9.1* 9.3*  HCT 21.3* 27.2* 27.7*  PLT 97*  --  95*   BMET Recent Labs    10/24/23 0750 10/24/23 1157  NA 138 138  K 3.9 3.6  CL 101 103  CO2 25 24  GLUCOSE 112* 107*  BUN 10 10  CREATININE 1.02 0.98  CALCIUM  9.1 8.8*   PT/INR No results for input(s): LABPROT, INR in the last 72 hours. ABG No results for input(s): PHART, HCO3 in the last 72 hours.  Invalid input(s): PCO2, PO2  Studies/Results: US  SCROTUM W/DOPPLER Result Date: 10/23/2023 EXAM: ULTRASOUND SCROTUM/TESTICLES WITH DOPPLER FLOW EVALUATION 10/23/2023 02:13:05 PM TECHNIQUE: Duplex ultrasound using B-mode/gray scaled imaging, Doppler spectral analysis and color flow Doppler was obtained of the testicles. COMPARISON: None available. CLINICAL HISTORY: Abscess. FINDINGS: RIGHT: MEASUREMENTS: The right testicle measures 3.9 x 1.9 x 2.3 cm.  GREY SCALE: The right testicle demonstrates normal homogeneous echotexture without focal lesion. No testicular microlithiasis. DOPPLER EVALUATION: There is normal arterial and venous Doppler flow within the testicle. VARICOCELE: Right varicocele noted, 3.1 mm in diameter. SCROTAL SAC: No hydrocele. EPIDIDYMIS: No acute abnormality. LEFT: MEASUREMENTS: The left testicle measures 3.5 x 2.0 x 2.1 cm. GREY SCALE: The left testicle demonstrates normal homogeneous echotexture without focal lesion. No testicular microlithiasis. DOPPLER EVALUATION: There is normal arterial and venous  Doppler flow within the testicle. VARICOCELE: No scrotal varicocele. SCROTAL SAC: No hydrocele. EPIDIDYMIS: 1.1 x 1.0 x 1.0 cm left epididymal cystic lesion potentially with some minimal layering complexity but no internal blood flow noted. Likely a minimally complex epididymal cyst or spermatocele. Appearance is not characteristic of epididymal abscess. IMPRESSION: 1. No evidence of  abscess. 2. Right varicocele, 3.1 mm in diameter. 3. Left epididymal cystic lesion (likely minimally complex epididymal cyst or spermatocele), 1.1 x 1.0 x 1.0 cm. 4. Normal testicular arterial and venous flow bilaterally. Electronically signed by: Ryan Salvage MD 10/23/2023 02:40 PM EDT RP Workstation: [id number]   CT Angio Chest PE W and/or Wo Contrast Result Date: 10/23/2023 EXAM: CTA of the Chest with contrast for PE 10/23/2023 12:28:06 AM TECHNIQUE: CTA of the chest was performed without and with the administration of 60 mL of intravenous contrast (iohexol  (OMNIPAQUE ) 350 MG/ML injection 75 mL IOHEXOL  350 MG/ML SOLN). Multiplanar reformatted images are provided for review. MIP images are provided for review. Automated exposure control, iterative reconstruction, and/or weight based adjustment of the mA/kV was utilized to reduce the radiation dose to as low as reasonably achievable. COMPARISON: None available. CLINICAL HISTORY: Pulmonary embolism (PE)  suspected, high prob. F/u from CT abdomen/pelvis w/ suspicious findings; Abscess; 60 ml omni 350. FINDINGS: PULMONARY ARTERIES: Pulmonary arteries are adequately opacified for evaluation. No pulmonary embolism. Main pulmonary artery is normal in caliber. MEDIASTINUM: The heart and pericardium demonstrate no acute abnormality. There is no acute abnormality of the thoracic aorta. LYMPH NODES: Left axillary nodal metastases measuring up to 2.0 cm (image 42). 13 mm short axis left subpectoral nodal metastasis (image 32). 13 mm short axis subcarinal node (image 69). LUNGS AND PLEURA: Mild interlobar septal thickening with subpleural nodularity in the lungs bilaterally, measuring up to 4 mm in the lingula (image 90). This is nonspecific but suspicious for perilymphatic tumor spread in this clinical setting. Trace bilateral pleural effusions, left greater than right. No pneumothorax. UPPER ABDOMEN: Limited images of the upper abdomen are unremarkable. SOFT TISSUES AND BONES: 8.3 x 3.8 cm left breast mass with overlying cutaneous thickening (image 81), suspicious for male breast cancer. Diffuse mixed sclerotic metastases throughout the visualized axial and appendicular skeleton. IMPRESSION: 1. No pulmonary embolism. 2. 8.3 cm left breast mass with overlying cutaneous thickening, suspicious for male breast cancer. 3. Left axillary, left subpectoral, and subcarinal nodal metastases. 4. Subpleural pulmonary nodularity, suspicious for metastases in this clinical setting. 5. Diffuse osseous metastases throughout the visualized axial and appendicular skeleton. 6. Trace bilateral pleural effusions. Electronically signed by: Pinkie Pebbles MD 10/23/2023 12:36 AM EDT RP Workstation: [id number]   CT ABDOMEN PELVIS W CONTRAST Result Date: 10/22/2023 EXAM: CT ABDOMEN AND PELVIS WITH CONTRAST 10/22/2023 11:38:08 PM TECHNIQUE: CT of the abdomen and pelvis was performed with the administration of 75 mL of intravenous contrast  (iohexol  (OMNIPAQUE ) 350 MG/ML injection 75 mL IOHEXOL  350 MG/ML SOLN). Multiplanar reformatted images are provided for review. Automated exposure control, iterative reconstruction, and/or weight-based adjustment of the mA/kV was utilized to reduce the radiation dose to as low as reasonably achievable. COMPARISON: None available. CLINICAL HISTORY: Concern for scrotal abscess, elevated LFT's. Pt complains of scrotal perineal abscess seen at urgent care sent in for further evaluation. FINDINGS: LOWER CHEST: Subpleural micronodularity in the lungs bilaterally, including a dominant 4 mm lingular nodule (image 1), indeterminate. Trace left pleural effusion. LIVER: Subcentimeter cyst in the left hepatic lobe (image 18), benign. GALLBLADDER AND BILE DUCTS: Gallbladder is unremarkable. No biliary ductal dilatation.  SPLEEN: No acute abnormality. PANCREAS: No acute abnormality. ADRENAL GLANDS: No acute abnormality. KIDNEYS, URETERS AND BLADDER: Moderate right hydronephrosis with associated 12 mm proximal right ureteral calculus at the L3-L4 level. No stones in the left kidney or left ureter. No perinephric or periureteral stranding. Mildly thick-walled bladder, although underdistended. GI AND BOWEL: Stomach demonstrates no acute abnormality. Normal appendix (image 52). There is no bowel obstruction. PERITONEUM AND RETROPERITONEUM: No ascites. No free air. VASCULATURE: Aorta is normal in caliber. Atherosclerotic calcifications of the abdominal aorta and branch vessels, although patent. LYMPH NODES: No lymphadenopathy. REPRODUCTIVE ORGANS: Prostatomegaly. Mild scrotal wall thickening/edema. No discrete abscess on CT. BONES AND SOFT TISSUES: Tiny fat-containing bilateral inguinal hernias. Diffuse sclerotic metastases throughout the visualized axial and appendicular skeleton. IMPRESSION: 1. Moderate right hydronephrosis with associated 12 mm proximal right ureteral calculus at the L3-L4 level. 2. Mild scrotal wall  thickening/edema without discrete abscess on CT. 3. Diffuse sclerotic metastases throughout the visualized axial and appendicular skeleton. Correlate for history of malignancy, presumably prostate CA. 4. Subpleural micronodularity measuring up to 4 mm in the lingula. In the setting of suspected primary malignancy, Fleischner Society guidelines do not apply. Consider follow-up CT chest as clinically warranted. Electronically signed by: Pinkie Pebbles MD 10/22/2023 11:52 PM EDT RP Workstation: [id number]     Assessment & Plan: Right ureteral calculus Scrotal abscess  Plan for cystoscopy, right retrograde pyelogram, insertion of right ureteral stent and possible I&D of scrotal abscess today.  I reviewed the procedure with him.  He understands and agrees to proceed.   Adine Manly, MD 10/24/2023

## 2023-10-24 NOTE — Assessment & Plan Note (Signed)
 HTN: amlodipine  5 mg every day  History of CVA: aspirin  81 mg every day, lipitor  80 mg every day  T2DM: last A1c on 09/26/23 7.1

## 2023-10-24 NOTE — Anesthesia Postprocedure Evaluation (Signed)
 Anesthesia Post Note  Patient: Larry Houston  Procedure(s) Performed: CYSTOSCOPY, WITH RIGHT RETROGRADE PYELOGRAM (Right) EXPLORATION, SCROTUM     Patient location during evaluation: PACU Anesthesia Type: General Level of consciousness: awake and alert Pain management: pain level controlled Vital Signs Assessment: post-procedure vital signs reviewed and stable Respiratory status: spontaneous breathing, nonlabored ventilation and respiratory function stable Cardiovascular status: blood pressure returned to baseline and stable Postop Assessment: no apparent nausea or vomiting Anesthetic complications: no   No notable events documented.  Last Vitals:  Vitals:   10/24/23 1705 10/24/23 1743  BP: (!) 154/83 (!) 154/83  Pulse: 80 80  Resp: 16   Temp: 36.4 C   SpO2: 97%     Last Pain:  Vitals:   10/24/23 1753  TempSrc:   PainSc: 8                  Adison Jerger,W. EDMOND

## 2023-10-24 NOTE — Assessment & Plan Note (Addendum)
 Hemoglobin 9.3, s/p 1 unit PRBCs yesterday.  - Will monitor  - AM CBC

## 2023-10-24 NOTE — Anesthesia Procedure Notes (Signed)
 Procedure Name: Intubation Date/Time: 10/24/2023 2:47 PM  Performed by: Virgil Ee, CRNAPre-anesthesia Checklist: Patient identified, Patient being monitored, Timeout performed, Emergency Drugs available and Suction available Patient Re-evaluated:Patient Re-evaluated prior to induction Oxygen Delivery Method: Circle system utilized Preoxygenation: Pre-oxygenation with 100% oxygen Induction Type: IV induction Ventilation: Mask ventilation without difficulty Laryngoscope Size: Mac and 4 Grade View: Grade I Tube type: Oral Tube size: 7.5 mm Number of attempts: 1 Airway Equipment and Method: Stylet Placement Confirmation: ETT inserted through vocal cords under direct vision, positive ETCO2 and breath sounds checked- equal and bilateral Secured at: 23 cm Tube secured with: Tape Dental Injury: Teeth and Oropharynx as per pre-operative assessment

## 2023-10-24 NOTE — Progress Notes (Signed)
 Patient arrived to [id number] from PACU with a 7/10 pain located in the abdomen. Patient voided . Bed in lowest position and call light within reach.

## 2023-10-24 NOTE — Progress Notes (Signed)
 Patient states no nausea or vomiting since receiving the Reglan. Appears to have been effective. Patient resting with eyes closed at this time.

## 2023-10-24 NOTE — Plan of Care (Signed)
 FMTS Interim Progress Note  Patient assessed at bedside after urology procedure for stone removal and scrotal I&D.  Patient states he still feels a little woozy from the procedure but otherwise doing well.  Denies nausea or pain.  States he had a couple bites of a sandwich and is gena order some chicken noodle soup.  States he feels ready to get out of here but discussed need for oncology workup and biopsy and he is agreeable to any workup needed here.  O: BP (!) 154/83 (BP Location: Right Arm)   Pulse 80   Temp 97.6 F (36.4 C) (Oral)   Resp 16   Ht 6' (1.829 m)   Wt 79.8 kg   SpO2 97%   BMI 23.87 kg/m    General: Sitting up in bed, NAD.  Mildly slurred speech at times. RESP: Normal work of breathing on room air Abdomen: Nondistended  A/P: Scrotal cellulitis Now s/p I&D per urology, unfortunately no intraoperative cultures collected to guide antibiotic management.  Will narrow as able.  Hydronephrosis of right kidney with obstructive nephrolithiasis Attempted ureteral stent placement, unfortunately unsuccessful.  Will proceed per urology recommendations.  Metastatic disease Unable to see oncology today due to procedure as above.  Planning to be seen tomorrow for further recommendations.  Per Dr. Loretha, patient needs bone biopsy for further evaluation.  IR messaged about requested procedure.  Theophilus Pagan, MD 10/24/2023, 5:42 PM PGY-3, [facility] Service pager [phone]

## 2023-10-24 NOTE — Transfer of Care (Signed)
 Immediate Anesthesia Transfer of Care Note  Patient: Larry Houston  Procedure(s) Performed: CYSTOSCOPY, WITH RIGHT RETROGRADE PYELOGRAM (Right) EXPLORATION, SCROTUM  Patient Location: PACU  Anesthesia Type:General  Level of Consciousness: drowsy  Airway & Oxygen Therapy: Patient Spontanous Breathing  Post-op Assessment: Report given to RN and Post -op Vital signs reviewed and stable  Post vital signs: Reviewed and stable  Last Vitals:  Vitals Value Taken Time  BP 130/76 10/24/23 16:00  Temp 36.9 C 10/24/23 15:45  Pulse 85 10/24/23 16:01  Resp 16 10/24/23 16:01  SpO2 96 % 10/24/23 16:01  Vitals shown include unfiled device data.  Last Pain:  Vitals:   10/24/23 1545  TempSrc:   PainSc: Asleep      Patients Stated Pain Goal: 0 (10/22/23 1939)  Complications: No notable events documented.

## 2023-10-24 NOTE — Anesthesia Preprocedure Evaluation (Signed)
 Anesthesia Evaluation  Patient identified by MRN, date of birth, ID band Patient awake    Reviewed: Allergy & Precautions, H&P , NPO status , Patient's Chart, lab work & pertinent test results  Airway Mallampati: II  TM Distance: >3 FB Neck ROM: Full    Dental no notable dental hx. (+) Partial Lower, Dental Advisory Given, Edentulous Upper   Pulmonary Current Smoker and Patient abstained from smoking.   Pulmonary exam normal breath sounds clear to auscultation       Cardiovascular hypertension, Pt. on medications  Rhythm:Regular Rate:Normal     Neuro/Psych negative neurological ROS  negative psych ROS   GI/Hepatic negative GI ROS,,,(+)     substance abuse  alcohol  use  Endo/Other  diabetes    Renal/GU Renal disease  negative genitourinary   Musculoskeletal  (+) Arthritis , Osteoarthritis,    Abdominal   Peds  Hematology  (+) Blood dyscrasia, anemia   Anesthesia Other Findings   Reproductive/Obstetrics negative OB ROS                              Anesthesia Physical Anesthesia Plan  ASA: 3  Anesthesia Plan: General   Post-op Pain Management: Ofirmev  IV (intra-op)*   Induction: Intravenous  PONV Risk Score and Plan: 2 and Ondansetron , Dexamethasone  and Treatment may vary due to age or medical condition  Airway Management Planned: LMA  Additional Equipment:   Intra-op Plan:   Post-operative Plan: Extubation in OR  Informed Consent: I have reviewed the patients History and Physical, chart, labs and discussed the procedure including the risks, benefits and alternatives for the proposed anesthesia with the patient or authorized representative who has indicated his/her understanding and acceptance.   Patient has DNR.  Discussed DNR with patient and Suspend DNR.   Dental advisory given  Plan Discussed with: CRNA  Anesthesia Plan Comments:         Anesthesia Quick  Evaluation

## 2023-10-24 NOTE — TOC Initial Note (Signed)
 Transition of Care [facility]) - Initial/Assessment Note    Patient Details  Name: Larry Houston MRN: [id number] Date of Birth: Aug 31, 1955  [facility] CM/SW Contact:    Jeoffrey LITTIE Moose, [place] Phone Number: 10/24/2023, 12:31 PM  Clinical Narrative:                 Pt admitted from home due to groin abscess. CSW completed ETOH consult. Pt stated he lives with a roommate, has transportation and a PCP and drinks alcohol  occasionally. Pt declined substance use resource packet offered by CSW.     Barriers to Discharge: Continued Medical Work up   Patient Goals and CMS Choice            Expected Discharge Plan and Services       Living arrangements for the past 2 months: Single Family Home                                      Prior Living Arrangements/Services Living arrangements for the past 2 months: Single Family Home Lives with:: Roommate Patient language and need for interpreter reviewed:: Yes Do you feel safe going back to the place where you live?: Yes      Need for Family Participation in Patient Care: No (Comment) Care giver support system in place?: Yes (comment)   Criminal Activity/Legal Involvement Pertinent to Current Situation/Hospitalization: No - Comment as needed  Activities of Daily Living   ADL Screening (condition at time of admission) Independently performs ADLs?: Yes (appropriate for developmental age) Is the patient deaf or have difficulty hearing?: No Does the patient have difficulty seeing, even when wearing glasses/contacts?: No Does the patient have difficulty concentrating, remembering, or making decisions?: No  Permission Sought/Granted                  Emotional Assessment Appearance:: Appears stated age Attitude/Demeanor/Rapport: Engaged Affect (typically observed): Pleasant Orientation: : Oriented to Self, Oriented to Place, Oriented to  Time, Oriented to Situation Alcohol  / Substance Use: Alcohol  Use Psych  Involvement: No (comment)  Admission diagnosis:  Cellulitis, scrotum [N49.2] Patient Active Problem List   Diagnosis Date Noted   Protein-calorie malnutrition, severe 10/24/2023   Metastatic disease (HCC) 10/23/2023   Chronic health problem 10/23/2023   Hidradenitis suppurativa of multiple sites 10/23/2023   Transaminitis 10/23/2023   Mass of left breast 10/23/2023   Nephrolithiasis 10/23/2023   Hydronephrosis of right kidney 10/23/2023   Ureteral calculus, right 10/23/2023   T2DM (type 2 diabetes mellitus) (HCC) 10/18/2023   Acute anemia 10/18/2023   Tobacco use 10/14/2023   History of CVA (cerebrovascular accident) 07/22/2019   Vertebral artery occlusion, 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 [place] [address] Phone: [phone] Fax: [phone]     Social Drivers of Health (SDOH) Social History: SDOH Screenings   Food Insecurity: No Food Insecurity (10/23/2023)  Housing: High Risk (10/23/2023)  Transportation Needs: No Transportation Needs (10/23/2023)  Utilities: Not At Risk (10/23/2023)  Social Connections:  Socially Isolated (10/23/2023)  Tobacco Use: Medium Risk (10/22/2023)   SDOH Interventions:     Readmission Risk Interventions     No data to display

## 2023-10-24 NOTE — Assessment & Plan Note (Signed)
 Patient with several weeks of indurated, tunneled, draining axillary lesions concerning for HS.  Potentially lymphadenopathy from metastatic cancer. - Monitor symptom progression with IV antibiotic management

## 2023-10-24 NOTE — Assessment & Plan Note (Addendum)
 Patient with spontaneously draining scrotal cellulitis.  No discrete abscess visualized on CT.  Recent fecal incontinence concerning for anaerobic contamination of wound. NPO today for surgery by urology.  - Antibiotics: IV Vancomycin  750 mg Q12h, IV cefepime 2 g Q8h, and add oral metronidazole  500 mg Q12h - Urology consulted, appreciate recommendations  - Plan for I&D today - Blood cultures no growth 2 days -Nausea: Reglan 5 mg every 8 hours as needed - AM CBC, BMP

## 2023-10-24 NOTE — Plan of Care (Signed)
 IR was requested for bone lesion bx.   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   Admitted yesterday and oncology and urology were consulted, patient is scheduled for urology procedure in OR today. IR was requested for bone lesion biopsy, Dr. 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 Protein electrophoresis and kappa/lambda light chains order and being proceed.   Primary team was notified that IR will wait for the oncology team evaluate the patient to determine if patient is in need of bone lesion bx VS BMBx.  No plan for IR procedure today.   Please call IR for questions and concerns.    Lametria Klunk H Lafawn Lenoir PA-C 10/24/2023 11:07 AM

## 2023-10-24 NOTE — Progress Notes (Signed)
 Urology Inpatient Progress Note  Subjective: No complaints today. No flank pain.  Cr normal.  Anti-infectives: Anti-infectives (From admission, onward)    Start     Dose/Rate Route Frequency Ordered Stop   10/24/23 1215  [MAR Hold]  metroNIDAZOLE  (FLAGYL ) IVPB 500 mg        (MAR Hold since Thu 10/24/2023 at 1345.Hold Reason: Transfer to a Procedural area)   500 mg 100 mL/hr over 60 Minutes Intravenous Every 12 hours 10/24/23 1129     10/23/23 1100  metroNIDAZOLE  (FLAGYL ) tablet 500 mg  Status:  Discontinued        500 mg Oral Every 12 hours 10/23/23 1052 10/24/23 1129   10/23/23 1000  [MAR Hold]  vancomycin  (VANCOREADY) IVPB 750 mg/150 mL        (MAR Hold since Thu 10/24/2023 at 1345.Hold Reason: Transfer to a Procedural area)   750 mg 150 mL/hr over 60 Minutes Intravenous Every 12 hours 10/23/23 0415     10/23/23 0430  [MAR Hold]  ceFEPIme (MAXIPIME) 2 g in sodium chloride  0.9 % 100 mL IVPB        (MAR Hold since Thu 10/24/2023 at 1345.Hold Reason: Transfer to a Procedural area)   2 g 200 mL/hr over 30 Minutes Intravenous Every 8 hours 10/23/23 0415     10/22/23 2145  vancomycin  (VANCOCIN ) IVPB 1000 mg/200 mL premix  Status:  Discontinued        1,000 mg 200 mL/hr over 60 Minutes Intravenous  Once 10/22/23 2136 10/22/23 2137   10/22/23 2145  vancomycin  (VANCOREADY) IVPB 1500 mg/300 mL        1,500 mg 150 mL/hr over 120 Minutes Intravenous  Once 10/22/23 2137 10/23/23 0059       Current Facility-Administered Medications  Medication Dose Route Frequency Provider Last Rate Last Admin   [MAR Hold] acetaminophen  (OFIRMEV ) IV 1,000 mg  1,000 mg Intravenous Q6H Baker, Amelia G, DO 400 mL/hr at 10/24/23 0856 1,000 mg at 10/24/23 0856   [MAR Hold] atorvastatin  (LIPITOR ) tablet 80 mg  80 mg Oral Daily Bhagat, Virali, DO   80 mg at 10/23/23 1220   [MAR Hold] ceFEPIme (MAXIPIME) 2 g in sodium chloride  0.9 % 100 mL IVPB  2 g Intravenous Q8H Bryk, Veronda P, RPH 200 mL/hr at 10/24/23 1151 2 g  at 10/24/23 1151   [MAR Hold] feeding supplement (ENSURE PLUS HIGH PROTEIN) liquid 237 mL  237 mL Oral BID BM Brown, Carina M, MD   237 mL at 10/23/23 1700   [MAR Hold] folic acid  (FOLVITE ) tablet 1 mg  1 mg Oral Daily Treadwell Smucker, Reagan, MD   1 mg at 10/23/23 0941   insulin aspart (novoLOG) injection 0-14 Units  0-14 Units Subcutaneous Q2H PRN Epifanio Charleston, MD       [facility] Hold] insulin aspart (novoLOG) injection 0-9 Units  0-9 Units Subcutaneous TID WC Bhagat, Virali, DO   1 Units at 10/23/23 1659   lactated ringers  infusion   Intravenous Continuous Epifanio Fallow, MD 10 mL/hr at 10/24/23 1405 New Bag at 10/24/23 1405   [MAR Hold] metoCLOPramide (REGLAN) injection 5 mg  5 mg Intravenous Q8H PRN Miller, Samantha, DO       [MAR Hold] metroNIDAZOLE  (FLAGYL ) IVPB 500 mg  500 mg Intravenous Q12H Cleotilde Lukes, DO       [MAR Hold] multivitamin with minerals tablet 1 tablet  1 tablet Oral Daily Treadwell Smucker, Reagan, MD   1 tablet at 10/23/23 0941   [MAR Hold] nicotine (NICODERM CQ -  dosed in mg/24 hours) patch 14 mg  14 mg Transdermal Daily Zheng, Jacky, MD   14 mg at 10/24/23 0826   [MAR Hold] oxyCODONE  (Oxy IR/ROXICODONE ) immediate release tablet 5 mg  5 mg Oral Q4H PRN Alena Morrison, Reagan, MD   5 mg at 10/23/23 2018   [facility] Hold] polyethylene glycol (MIRALAX / GLYCOLAX) packet 17 g  17 g Oral Daily Alena Morrison, Reagan, MD   17 g at 10/23/23 0940   [MAR Hold] senna (SENOKOT) tablet 8.6 mg  1 tablet Oral Daily Alena Morrison, Reagan, MD   8.6 mg at 10/23/23 0941   [MAR Hold] thiamine  (VITAMIN B1) tablet 100 mg  100 mg Oral Daily Alena Morrison, Reagan, MD   100 mg at 10/23/23 0940   Or   [MAR Hold] thiamine  (VITAMIN B1) injection 100 mg  100 mg Intravenous Daily Alena Morrison, Reagan, MD   100 mg at 10/24/23 0827   [MAR Hold] vancomycin  (VANCOREADY) IVPB 750 mg/150 mL  750 mg Intravenous Q12H Mattie Marvetta SQUIBB, RPH 150 mL/hr at 10/24/23 1111 750 mg at 10/24/23  1111     Objective: Vital signs in last 24 hours: Temp:  [97.6 F (36.4 C)-98.3 F (36.8 C)] 97.6 F (36.4 C) (10/16 1343) Pulse Rate:  [83-95] 90 (10/16 1343) Resp:  [16-18] 18 (10/16 1343) BP: (134-164)/(71-85) 157/82 (10/16 1343) SpO2:  [98 %-100 %] 98 % (10/16 1343) Weight:  [79.8 kg] 79.8 kg (10/16 1343)  Intake/Output from previous day: 10/15 0701 - 10/16 0700 In: 869.8 [P.O.:476; Blood:393.8] Out: 1475 [Urine:1475] Intake/Output this shift: Total I/O In: 0  Out: 700 [Urine:700]  GENERAL APPEARANCE:  Well appearing, well developed, well nourished, NAD HEENT:  Atraumatic, normocephalic, oropharynx clear NECK:  Supple without lymphadenopathy or thyromegaly ABDOMEN:  Soft, non-tender, no masses EXTREMITIES:  Moves all extremities well, without clubbing, cyanosis, or edema NEUROLOGIC:  Alert and oriented x 3, normal gait, CN II-XII grossly intact MENTAL STATUS:  appropriate BACK:  Non-tender to palpation, No CVAT SKIN:  Warm, dry, and intact   Lab Results:  Recent Labs    10/23/23 0619 10/23/23 2016 10/24/23 0750  WBC 7.8  --  6.5  HGB 6.7* 9.1* 9.3*  HCT 21.3* 27.2* 27.7*  PLT 97*  --  95*   BMET Recent Labs    10/24/23 0750 10/24/23 1157  NA 138 138  K 3.9 3.6  CL 101 103  CO2 25 24  GLUCOSE 112* 107*  BUN 10 10  CREATININE 1.02 0.98  CALCIUM  9.1 8.8*   PT/INR No results for input(s): LABPROT, INR in the last 72 hours. ABG No results for input(s): PHART, HCO3 in the last 72 hours.  Invalid input(s): PCO2, PO2  Studies/Results: US  SCROTUM W/DOPPLER Result Date: 10/23/2023 EXAM: ULTRASOUND SCROTUM/TESTICLES WITH DOPPLER FLOW EVALUATION 10/23/2023 02:13:05 PM TECHNIQUE: Duplex ultrasound using B-mode/gray scaled imaging, Doppler spectral analysis and color flow Doppler was obtained of the testicles. COMPARISON: None available. CLINICAL HISTORY: Abscess. FINDINGS: RIGHT: MEASUREMENTS: The right testicle measures 3.9 x 1.9 x 2.3 cm.  GREY SCALE: The right testicle demonstrates normal homogeneous echotexture without focal lesion. No testicular microlithiasis. DOPPLER EVALUATION: There is normal arterial and venous Doppler flow within the testicle. VARICOCELE: Right varicocele noted, 3.1 mm in diameter. SCROTAL SAC: No hydrocele. EPIDIDYMIS: No acute abnormality. LEFT: MEASUREMENTS: The left testicle measures 3.5 x 2.0 x 2.1 cm. GREY SCALE: The left testicle demonstrates normal homogeneous echotexture without focal lesion. No testicular microlithiasis. DOPPLER EVALUATION: There is normal arterial and venous  Doppler flow within the testicle. VARICOCELE: No scrotal varicocele. SCROTAL SAC: No hydrocele. EPIDIDYMIS: 1.1 x 1.0 x 1.0 cm left epididymal cystic lesion potentially with some minimal layering complexity but no internal blood flow noted. Likely a minimally complex epididymal cyst or spermatocele. Appearance is not characteristic of epididymal abscess. IMPRESSION: 1. No evidence of  abscess. 2. Right varicocele, 3.1 mm in diameter. 3. Left epididymal cystic lesion (likely minimally complex epididymal cyst or spermatocele), 1.1 x 1.0 x 1.0 cm. 4. Normal testicular arterial and venous flow bilaterally. Electronically signed by: Ryan Salvage MD 10/23/2023 02:40 PM EDT RP Workstation: [id number]   CT Angio Chest PE W and/or Wo Contrast Result Date: 10/23/2023 EXAM: CTA of the Chest with contrast for PE 10/23/2023 12:28:06 AM TECHNIQUE: CTA of the chest was performed without and with the administration of 60 mL of intravenous contrast (iohexol  (OMNIPAQUE ) 350 MG/ML injection 75 mL IOHEXOL  350 MG/ML SOLN). Multiplanar reformatted images are provided for review. MIP images are provided for review. Automated exposure control, iterative reconstruction, and/or weight based adjustment of the mA/kV was utilized to reduce the radiation dose to as low as reasonably achievable. COMPARISON: None available. CLINICAL HISTORY: Pulmonary embolism (PE)  suspected, high prob. F/u from CT abdomen/pelvis w/ suspicious findings; Abscess; 60 ml omni 350. FINDINGS: PULMONARY ARTERIES: Pulmonary arteries are adequately opacified for evaluation. No pulmonary embolism. Main pulmonary artery is normal in caliber. MEDIASTINUM: The heart and pericardium demonstrate no acute abnormality. There is no acute abnormality of the thoracic aorta. LYMPH NODES: Left axillary nodal metastases measuring up to 2.0 cm (image 42). 13 mm short axis left subpectoral nodal metastasis (image 32). 13 mm short axis subcarinal node (image 69). LUNGS AND PLEURA: Mild interlobar septal thickening with subpleural nodularity in the lungs bilaterally, measuring up to 4 mm in the lingula (image 90). This is nonspecific but suspicious for perilymphatic tumor spread in this clinical setting. Trace bilateral pleural effusions, left greater than right. No pneumothorax. UPPER ABDOMEN: Limited images of the upper abdomen are unremarkable. SOFT TISSUES AND BONES: 8.3 x 3.8 cm left breast mass with overlying cutaneous thickening (image 81), suspicious for male breast cancer. Diffuse mixed sclerotic metastases throughout the visualized axial and appendicular skeleton. IMPRESSION: 1. No pulmonary embolism. 2. 8.3 cm left breast mass with overlying cutaneous thickening, suspicious for male breast cancer. 3. Left axillary, left subpectoral, and subcarinal nodal metastases. 4. Subpleural pulmonary nodularity, suspicious for metastases in this clinical setting. 5. Diffuse osseous metastases throughout the visualized axial and appendicular skeleton. 6. Trace bilateral pleural effusions. Electronically signed by: Pinkie Pebbles MD 10/23/2023 12:36 AM EDT RP Workstation: [id number]   CT ABDOMEN PELVIS W CONTRAST Result Date: 10/22/2023 EXAM: CT ABDOMEN AND PELVIS WITH CONTRAST 10/22/2023 11:38:08 PM TECHNIQUE: CT of the abdomen and pelvis was performed with the administration of 75 mL of intravenous contrast  (iohexol  (OMNIPAQUE ) 350 MG/ML injection 75 mL IOHEXOL  350 MG/ML SOLN). Multiplanar reformatted images are provided for review. Automated exposure control, iterative reconstruction, and/or weight-based adjustment of the mA/kV was utilized to reduce the radiation dose to as low as reasonably achievable. COMPARISON: None available. CLINICAL HISTORY: Concern for scrotal abscess, elevated LFT's. Pt complains of scrotal perineal abscess seen at urgent care sent in for further evaluation. FINDINGS: LOWER CHEST: Subpleural micronodularity in the lungs bilaterally, including a dominant 4 mm lingular nodule (image 1), indeterminate. Trace left pleural effusion. LIVER: Subcentimeter cyst in the left hepatic lobe (image 18), benign. GALLBLADDER AND BILE DUCTS: Gallbladder is unremarkable. No biliary ductal dilatation.  SPLEEN: No acute abnormality. PANCREAS: No acute abnormality. ADRENAL GLANDS: No acute abnormality. KIDNEYS, URETERS AND BLADDER: Moderate right hydronephrosis with associated 12 mm proximal right ureteral calculus at the L3-L4 level. No stones in the left kidney or left ureter. No perinephric or periureteral stranding. Mildly thick-walled bladder, although underdistended. GI AND BOWEL: Stomach demonstrates no acute abnormality. Normal appendix (image 52). There is no bowel obstruction. PERITONEUM AND RETROPERITONEUM: No ascites. No free air. VASCULATURE: Aorta is normal in caliber. Atherosclerotic calcifications of the abdominal aorta and branch vessels, although patent. LYMPH NODES: No lymphadenopathy. REPRODUCTIVE ORGANS: Prostatomegaly. Mild scrotal wall thickening/edema. No discrete abscess on CT. BONES AND SOFT TISSUES: Tiny fat-containing bilateral inguinal hernias. Diffuse sclerotic metastases throughout the visualized axial and appendicular skeleton. IMPRESSION: 1. Moderate right hydronephrosis with associated 12 mm proximal right ureteral calculus at the L3-L4 level. 2. Mild scrotal wall  thickening/edema without discrete abscess on CT. 3. Diffuse sclerotic metastases throughout the visualized axial and appendicular skeleton. Correlate for history of malignancy, presumably prostate CA. 4. Subpleural micronodularity measuring up to 4 mm in the lingula. In the setting of suspected primary malignancy, Fleischner Society guidelines do not apply. Consider follow-up CT chest as clinically warranted. Electronically signed by: Pinkie Pebbles MD 10/22/2023 11:52 PM EDT RP Workstation: [id number]     Assessment & Plan: Right ureteral calculus Scrotal abscess  Plan for cystoscopy, right retrograde pyelogram, insertion of right ureteral stent and possible I&D of scrotal abscess today.  I reviewed the procedure with him.  He understands and agrees to proceed.   Adine Manly, MD 10/24/2023

## 2023-10-25 ENCOUNTER — Encounter (HOSPITAL_COMMUNITY): Payer: Self-pay | Admitting: Urology

## 2023-10-25 ENCOUNTER — Inpatient Hospital Stay (HOSPITAL_COMMUNITY)

## 2023-10-25 DIAGNOSIS — N492 Inflammatory disorders of scrotum: Secondary | ICD-10-CM | POA: Diagnosis not present

## 2023-10-25 HISTORY — PX: IR NEPHROSTOMY PLACEMENT RIGHT: IMG6064

## 2023-10-25 LAB — CBC
HCT: 26.1 % — ABNORMAL LOW (ref 39.0–52.0)
Hemoglobin: 8.9 g/dL — ABNORMAL LOW (ref 13.0–17.0)
MCH: 30.7 pg (ref 26.0–34.0)
MCHC: 34.1 g/dL (ref 30.0–36.0)
MCV: 90 fL (ref 80.0–100.0)
Platelets: 101 K/uL — ABNORMAL LOW (ref 150–400)
RBC: 2.9 MIL/uL — ABNORMAL LOW (ref 4.22–5.81)
RDW: 20.2 % — ABNORMAL HIGH (ref 11.5–15.5)
WBC: 6.9 K/uL (ref 4.0–10.5)
nRBC: 1.7 % — ABNORMAL HIGH (ref 0.0–0.2)

## 2023-10-25 LAB — COMPREHENSIVE METABOLIC PANEL WITH GFR
ALT: 211 U/L — ABNORMAL HIGH (ref 0–44)
AST: 245 U/L — ABNORMAL HIGH (ref 15–41)
Albumin: 2.5 g/dL — ABNORMAL LOW (ref 3.5–5.0)
Alkaline Phosphatase: 508 U/L — ABNORMAL HIGH (ref 38–126)
Anion gap: 11 (ref 5–15)
BUN: 17 mg/dL (ref 8–23)
CO2: 23 mmol/L (ref 22–32)
Calcium: 8.6 mg/dL — ABNORMAL LOW (ref 8.9–10.3)
Chloride: 102 mmol/L (ref 98–111)
Creatinine, Ser: 1.14 mg/dL (ref 0.61–1.24)
GFR, Estimated: >60 mL/min (ref >60)
Glucose, Bld: 195 mg/dL — ABNORMAL HIGH (ref 70–99)
Potassium: 3.7 mmol/L (ref 3.5–5.1)
Sodium: 136 mmol/L (ref 135–145)
Total Bilirubin: 1.4 mg/dL — ABNORMAL HIGH (ref 0.0–1.2)
Total Protein: 7.1 g/dL (ref 6.5–8.1)

## 2023-10-25 LAB — PROTEIN ELECTROPHORESIS, SERUM
A/G Ratio: 0.8 (ref 0.7–1.7)
Albumin ELP: 3.1 g/dL (ref 2.9–4.4)
Alpha-1-Globulin: 0.5 g/dL — ABNORMAL HIGH (ref 0.0–0.4)
Alpha-2-Globulin: 0.7 g/dL (ref 0.4–1.0)
Beta Globulin: 1 g/dL (ref 0.7–1.3)
Gamma Globulin: 1.9 g/dL — ABNORMAL HIGH (ref 0.4–1.8)
Globulin, Total: 4.1 g/dL — ABNORMAL HIGH (ref 2.2–3.9)
Total Protein ELP: 7.2 g/dL (ref 6.0–8.5)

## 2023-10-25 LAB — GLUCOSE, CAPILLARY
Glucose-Capillary: 112 mg/dL — ABNORMAL HIGH (ref 70–99)
Glucose-Capillary: 112 mg/dL — ABNORMAL HIGH (ref 70–99)
Glucose-Capillary: 156 mg/dL — ABNORMAL HIGH (ref 70–99)
Glucose-Capillary: 161 mg/dL — ABNORMAL HIGH (ref 70–99)
Glucose-Capillary: 186 mg/dL — ABNORMAL HIGH (ref 70–99)

## 2023-10-25 LAB — PROTIME-INR
INR: 1.2 (ref 0.8–1.2)
Prothrombin Time: 15.9 s — ABNORMAL HIGH (ref 11.4–15.2)

## 2023-10-25 MED ORDER — CIPROFLOXACIN IN D5W 400 MG/200ML IV SOLN
400.0000 mg | Freq: Two times a day (BID) | INTRAVENOUS | Status: DC
  Administered 2023-10-25 – 2023-10-27 (×4): 400 mg via INTRAVENOUS
  Filled 2023-10-25 (×5): qty 200

## 2023-10-25 MED ORDER — FENTANYL CITRATE (PF) 100 MCG/2ML IJ SOLN
INTRAMUSCULAR | Status: AC
  Filled 2023-10-25: qty 2

## 2023-10-25 MED ORDER — SODIUM CHLORIDE 0.9 % IV SOLN: 3.0000 g | Freq: Three times a day (TID) | INTRAVENOUS | Status: DC

## 2023-10-25 MED ORDER — LIDOCAINE HCL 1 % IJ SOLN
INTRAMUSCULAR | Status: AC
  Filled 2023-10-25: qty 20

## 2023-10-25 MED ORDER — MIDAZOLAM HCL 2 MG/2ML IJ SOLN
INTRAMUSCULAR | Status: AC
  Filled 2023-10-25: qty 2

## 2023-10-25 MED ORDER — LEVOFLOXACIN IN D5W 250 MG/50ML IV SOLN
250.0000 mg | Freq: Once | INTRAVENOUS | Status: DC
  Filled 2023-10-25: qty 50

## 2023-10-25 MED ORDER — LIDOCAINE-EPINEPHRINE 1 %-1:100000 IJ SOLN
20.0000 mL | Freq: Once | INTRAMUSCULAR | Status: AC
  Administered 2023-10-25: 10 mL via INTRADERMAL

## 2023-10-25 MED ORDER — FENTANYL CITRATE (PF) 100 MCG/2ML IJ SOLN
INTRAMUSCULAR | Status: AC | PRN
  Administered 2023-10-25 (×2): 50 ug via INTRAVENOUS

## 2023-10-25 MED ORDER — ONDANSETRON 4 MG PO TBDP
4.0000 mg | ORAL_TABLET | Freq: Three times a day (TID) | ORAL | Status: DC | PRN
  Administered 2023-10-25 – 2023-10-26 (×2): 4 mg via ORAL
  Filled 2023-10-25 (×2): qty 1

## 2023-10-25 MED ORDER — GABAPENTIN 300 MG PO CAPS
300.0000 mg | ORAL_CAPSULE | Freq: Two times a day (BID) | ORAL | Status: DC
  Filled 2023-10-25 (×4): qty 1

## 2023-10-25 MED ORDER — FOLIC ACID 5 MG/ML IJ SOLN
1.0000 mg | Freq: Once | INTRAMUSCULAR | Status: DC
  Filled 2023-10-25 (×2): qty 0.2

## 2023-10-25 MED ORDER — ASPIRIN 81 MG PO TBEC
81.0000 mg | DELAYED_RELEASE_TABLET | Freq: Every day | ORAL | Status: DC
  Administered 2023-10-26 – 2023-10-27 (×2): 81 mg via ORAL
  Filled 2023-10-25 (×2): qty 1

## 2023-10-25 MED ORDER — ACETAMINOPHEN 10 MG/ML IV SOLN
1000.0000 mg | Freq: Four times a day (QID) | INTRAVENOUS | Status: AC
  Administered 2023-10-25 – 2023-10-26 (×4): 1000 mg via INTRAVENOUS
  Filled 2023-10-25 (×5): qty 100

## 2023-10-25 MED ORDER — MIDAZOLAM HCL (PF) 2 MG/2ML IJ SOLN
INTRAMUSCULAR | Status: AC | PRN
  Administered 2023-10-25 (×2): 1 mg via INTRAVENOUS

## 2023-10-25 MED ORDER — ONDANSETRON HCL 4 MG/2ML IJ SOLN
INTRAMUSCULAR | Status: AC | PRN
  Administered 2023-10-25: 4 mg via INTRAVENOUS

## 2023-10-25 MED ORDER — LEVOFLOXACIN IN D5W 250 MG/50ML IV SOLN
INTRAVENOUS | Status: AC | PRN
  Administered 2023-10-25: 250 mg

## 2023-10-25 MED ORDER — MORPHINE SULFATE (PF) 2 MG/ML IV SOLN
2.0000 mg | INTRAVENOUS | Status: DC | PRN
  Administered 2023-10-25: 2 mg via INTRAVENOUS
  Filled 2023-10-25: qty 1

## 2023-10-25 MED ORDER — LIDOCAINE-EPINEPHRINE 1 %-1:100000 IJ SOLN
INTRAMUSCULAR | Status: AC
  Filled 2023-10-25: qty 1

## 2023-10-25 MED ORDER — METRONIDAZOLE 500 MG/100ML IV SOLN
500.0000 mg | Freq: Two times a day (BID) | INTRAVENOUS | Status: DC
  Administered 2023-10-25 – 2023-10-27 (×4): 500 mg via INTRAVENOUS
  Filled 2023-10-25 (×4): qty 100

## 2023-10-25 MED ORDER — IOHEXOL 300 MG/ML  SOLN
50.0000 mL | Freq: Once | INTRAMUSCULAR | Status: AC | PRN
  Administered 2023-10-25: 50 mL

## 2023-10-25 MED ORDER — ONDANSETRON HCL 4 MG/2ML IJ SOLN
INTRAMUSCULAR | Status: AC
  Filled 2023-10-25: qty 2

## 2023-10-25 MED FILL — Doxycycline Hyclate For Inj 100 MG: 100.0000 mg | INTRAVENOUS | Qty: 100 | Status: AC

## 2023-10-25 NOTE — Sedation Documentation (Addendum)
 Patient vomited very small amount ABX stopped. Patient prone;  states that he feels better after vomiting. Suction at the ready, 4mg  zofran  on board VS stable

## 2023-10-25 NOTE — Progress Notes (Addendum)
   IR Note  ADD on procedure per order--- already scheduled in IR today for right percutaneous nephrostomy placement.  To add iliac bone lesion biopsy per MD   CT 10/14: IMPRESSION: 1. Moderate right hydronephrosis with associated 12 mm proximal right ureteral calculus at the L3-L4 level. 2. Mild scrotal wall thickening/edema without discrete abscess on CT. 3. Diffuse sclerotic metastases throughout the visualized axial and appendicular skeleton. Correlate for history of malignancy, presumably prostate CA. 4. Subpleural micronodularity measuring up to 4 mm in the lingula. In the setting of suspected primary malignancy, Fleischner Society guidelines do not Apply  Reviewed with Dr Jenna Approved Iliac lesion for biopsy (Right or left)--- he will decide best in procedure  Pt is aware of procedure  Risks and benefits of iliac bone lesion biopsy was discussed with the patient and/or patient's family including, but not limited to bleeding, infection, damage to adjacent structures or low yield requiring additional tests.  All of the questions were answered and there is agreement to proceed.  Consent signed and in chart.

## 2023-10-25 NOTE — Consult Note (Signed)
 [facility]  [phone] [phone]   MEDICAL ONCOLOGY - INITIAL CONSULTATION    Referral MD  Reason for Referral: New diagnosis of male breast cancer  Chief Complaint  Patient presents with   Abscess    HPI:  This is a very pleasant 68 year old male patient with past medical history significant for hypertension admitted with chief complaint of scrotal swelling, chills.  He is being treated for scrotal abscess.  He was also found to have a large left breast mass and bone lesions hence medical oncology was consulted.  He and his roommate were present at the time of my visit.  According to the patient, the left breast mass has been growing for months to years.  He thought it was another abscess which needs lancing and hence did not go to the doctor.  He has not seen a primary care physician in many years.  He denies any specific bone pain today.  He is a longstanding alcoholic, drinks about three 4 ounce glasses of whiskey every day.  He also smokes about half pack a day on average.  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  He lives with a roommate, had the same roommate for the past 43 years.  He denies any recreational drug use.  He does have 2 kids.  No family history of breast cancer, ovarian cancer, pancreatic cancer, melanoma.  Rest of the pertinent 10 point ROS reviewed and negative   Past Medical History:  Diagnosis Date   Alcohol  abuse    Arthritis    lumbar stenosis    Hypertension    Scrotal abscess    Stroke [facility])    Venous malformation 09/03/2016   Right lower extremity venous malformation  :   Past Surgical History:  Procedure Laterality Date   CYSTOSCOPY W/ URETERAL STENT PLACEMENT Right 10/24/2023   Procedure: CYSTOSCOPY, WITH RIGHT RETROGRADE PYELOGRAM;  Surgeon: Roseann Adine PARAS., MD;  Location: [facility] OR;  Service: Urology;  Laterality: Right;  POSSIBLE LASER LIPOTRIPSY   IR RADIOLOGIST EVAL & MGMT   04/05/2016   IR RADIOLOGIST EVAL & MGMT  10/17/2016   IR RADIOLOGIST EVAL & MGMT  12/05/2016   IR SCLEROTHERAPY OF A FLUID COLLECTION Right 09/13/2016   Venous malformation [Q27.9]   RLE   IR SCLEROTHERAPY OF A FLUID COLLECTION  09/13/2016   IR US  GUIDE VASC ACCESS RIGHT  09/13/2016   LUMBAR LAMINECTOMY/DECOMPRESSION MICRODISCECTOMY Bilateral 01/26/2015   Procedure: Laminectomy and Foraminotomy - L4-L5 - bilateral;  Surgeon: Alm GORMAN Molt, MD;  Location: [facility] NEURO ORS;  Service: Neurosurgery;  Laterality: Bilateral;  Laminectomy and Foraminotomy - L4-L5 - bilateral   NASAL ENDOSCOPY WITH EPISTAXIS CONTROL N/A 04/04/2014   Procedure: NASAL ENDOSCOPY WITH EPISTAXIS CONTROL;  Surgeon: Merilee Kraft, MD;  Location: [facility] OR;  Service: ENT;  Laterality: N/A;   NECK SURGERY  1992   posterior fusion- cervical    RADIOLOGY WITH ANESTHESIA N/A 09/13/2016   Procedure: percutaneous sclerotherapy with dehydrated ethanol;  Surgeon: Karalee Beat, MD;  Location: [facility] OR;  Service: Radiology;  Laterality: N/A;   SCROTAL EXPLORATION N/A 10/24/2023   Procedure: EXPLORATION, SCROTUM;  Surgeon: Roseann Adine PARAS., MD;  Location: [facility] OR;  Service: Urology;  Laterality: N/A;  IRRIGATION AND DEBRIDEMENT SCROTUM ABSCESS  :   Current Facility-Administered Medications  Medication Dose Route Frequency Provider Last Rate Last Admin   acetaminophen  (OFIRMEV ) IV 1,000 mg  1,000 mg Intravenous Q6H Baker, Amelia G, DO 400  mL/hr at 10/25/23 1718 1,000 mg at 10/25/23 1718   [START ON 10/26/2023] aspirin  EC tablet 81 mg  81 mg Oral Daily Marten Peat M, RPH       atorvastatin  (LIPITOR ) tablet 80 mg  80 mg Oral Daily Roseann Adine PARAS., MD   80 mg at 10/25/23 1712   ciprofloxacin  (CIPRO ) IVPB 400 mg  400 mg Intravenous Q12H Cleotilde Lukes, DO       feeding supplement (ENSURE PLUS HIGH PROTEIN) liquid 237 mL  237 mL Oral BID BM Roseann Adine PARAS., MD   237 mL at 10/25/23 1712   folic acid  (FOLVITE ) tablet 1 mg  1 mg Oral  Daily Roseann Adine PARAS., MD   1 mg at 10/25/23 1713   folic acid  injection 1 mg  1 mg Intravenous Once McIntyre, Brittany J, MD       gabapentin  (NEURONTIN ) capsule 300 mg  300 mg Oral BID Theophilus Pagan, MD       insulin aspart (novoLOG) injection 0-9 Units  0-9 Units Subcutaneous TID WC Roseann Adine PARAS., MD   2 Units at 10/25/23 1755   metroNIDAZOLE  (FLAGYL ) IVPB 500 mg  500 mg Intravenous Q12H Cleotilde Lukes, DO       morphine  (PF) 2 MG/ML injection 2 mg  2 mg Intravenous Q4H PRN Baker, Amelia G, DO       multivitamin with minerals tablet 1 tablet  1 tablet Oral Daily Stoneking, Adine PARAS., MD   1 tablet at 10/25/23 1715   nicotine (NICODERM CQ - dosed in mg/24 hours) patch 14 mg  14 mg Transdermal Daily Roseann Adine PARAS., MD   14 mg at 10/25/23 9192   oxyCODONE  (Oxy IR/ROXICODONE ) immediate release tablet 5 mg  5 mg Oral Q4H PRN Roseann Adine PARAS., MD   5 mg at 10/24/23 2045   polyethylene glycol (MIRALAX / GLYCOLAX) packet 17 g  17 g Oral Daily Roseann Adine PARAS., MD   17 g at 10/25/23 1716   senna (SENOKOT) tablet 8.6 mg  1 tablet Oral Daily Roseann Adine PARAS., MD   8.6 mg at 10/25/23 1716   thiamine  (VITAMIN B1) tablet 100 mg  100 mg Oral Daily Roseann Adine PARAS., MD   100 mg at 10/23/23 0940   Or   thiamine  (VITAMIN B1) injection 100 mg  100 mg Intravenous Daily Roseann Adine PARAS., MD   100 mg at 10/25/23 0801      Allergies  Allergen Reactions   Penicillins Hives    Has patient had a PCN reaction causing immediate rash, facial/tongue/throat swelling, SOB or lightheadedness with hypotension: No Has patient had a PCN reaction causing severe rash involving mucus membranes or skin necrosis: No Has patient had a PCN reaction that required hospitalization No Has patient had a PCN reaction occurring within the last 10 years: No If all of the above answers are NO, then may proceed with Cephalosporin use.  :   Family History  Problem Relation Age of  Onset   Diabetes Mother    Hypertension Mother    Anemia Mother        Died of aplastic anemia   Lung disease Father    Heart attack Brother    Heart disease Sister        One sister had heart arrest  :   Social History   Socioeconomic History   Marital status: Divorced    Spouse name: Not on file   Number of children: Not on file   Years  of education: Not on file   Highest education level: Not on file  Occupational History   Not on file  Tobacco Use   Smoking status: Every Day    Current packs/day: 0.00    Types: Cigarettes    Start date: 01/14/1999    Last attempt to quit: 01/13/2006    Years since quitting: 17.7   Smokeless tobacco: Never  Vaping Use   Vaping status: Never Used  Substance and Sexual Activity   Alcohol  use: Yes    Comment: occasional    Drug use: No   Sexual activity: Not on file  Other Topics Concern   Not on file  Social History Narrative   Not on file   Social Drivers of Health   Financial Resource Strain: Not on file  Food Insecurity: No Food Insecurity (10/23/2023)   Hunger Vital Sign    Worried About Running Out of Food in the Last Year: Never true    Ran Out of Food in the Last Year: Never true  Transportation Needs: No Transportation Needs (10/23/2023)   PRAPARE - Administrator, Civil Service (Medical): No    Lack of Transportation (Non-Medical): No  Physical Activity: Not on file  Stress: Not on file  Social Connections: Socially Isolated (10/23/2023)   Social Connection and Isolation Panel    Frequency of Communication with Friends and Family: Once a week    Frequency of Social Gatherings with Friends and Family: Never    Attends Religious Services: Never    Database administrator or Organizations: Yes    Attends Banker Meetings: Never    Marital Status: Divorced  Catering manager Violence: Not At Risk (10/23/2023)   Humiliation, Afraid, Rape, and Kick questionnaire    Fear of Current or Ex-Partner: No     Emotionally Abused: No    Physically Abused: No    Sexually Abused: No    Exam: Patient Vitals for the past 24 hrs:  BP Temp Temp src Pulse Resp SpO2  10/25/23 1622 127/77 (!) 97.5 F (36.4 C) Oral 84 12 98 %  10/25/23 1557 119/71 -- -- 84 (!) 0 100 %  10/25/23 1555 119/71 -- -- 84 17 100 %  10/25/23 1550 117/72 -- -- 82 (!) 0 100 %  10/25/23 1545 119/71 -- -- 82 17 100 %  10/25/23 1540 124/70 -- -- 82 17 100 %  10/25/23 1535 120/70 -- -- 81 18 100 %  10/25/23 1530 128/68 -- -- 85 16 100 %  10/25/23 1527 128/71 -- -- 76 18 100 %  10/25/23 1525 128/71 -- -- 77 13 100 %  10/25/23 1520 122/78 -- -- 80 18 100 %  10/25/23 1515 124/66 -- -- 77 (!) 0 100 %  10/25/23 1511 -- -- -- 80 18 100 %  10/25/23 1450 131/71 -- -- 76 14 100 %  10/25/23 1445 137/72 -- -- 74 16 100 %  10/25/23 1440 131/72 -- -- 76 17 100 %  10/25/23 1435 133/72 -- -- 74 17 100 %  10/25/23 1430 126/72 -- -- 73 17 100 %  10/25/23 1425 129/67 -- -- 74 13 100 %  10/25/23 1420 120/64 -- -- 67 17 100 %  10/25/23 1415 116/65 -- -- 67 17 100 %  10/25/23 1410 109/68 -- -- 66 18 100 %  10/25/23 1405 101/61 -- -- 64 (!) 21 100 %  10/25/23 1400 (!) 95/58 -- -- 62 19 100 %  10/25/23 1355 (!)  85/57 -- -- 62 18 100 %  10/25/23 1350 96/61 -- -- 76 (!) 23 100 %  10/25/23 1345 104/79 -- -- 85 18 100 %  10/25/23 1340 139/71 -- -- 83 14 100 %  10/25/23 1335 139/73 -- -- 93 17 100 %  10/25/23 1330 127/73 -- -- 86 15 100 %  10/25/23 1320 138/76 -- -- 81 10 100 %  10/25/23 0958 (!) 148/84 98.1 F (36.7 C) Oral 80 18 99 %  10/25/23 0457 (!) 141/82 98.3 F (36.8 C) -- 82 18 100 %  10/25/23 0106 (!) 149/92 98.2 F (36.8 C) -- 87 17 98 %  10/24/23 2223 (!) 142/78 97.6 F (36.4 C) Oral 91 18 98 %   He appears well, no acute distress No cervical adenopathy.  Bilateral axilla with multiple lesions, likely hidradenitis suppurativa.  Large left breast mass occupying pretty much the entire portion of left breast.  Concern for skin  involvement.  Palpable left axillary adenopathy. Clear to auscultation bilaterally Rate and rhythm regular No lower extremity edema.  Lab Results  Component Value Date   WBC 6.9 10/25/2023   HGB 8.9 (L) 10/25/2023   HCT 26.1 (L) 10/25/2023   PLT 101 (L) 10/25/2023   GLUCOSE 195 (H) 10/25/2023   CHOL 135 07/23/2019   TRIG 170 (H) 07/23/2019   HDL 46 07/23/2019   LDLCALC 55 07/23/2019   ALT 211 (H) 10/25/2023   AST 245 (H) 10/25/2023   NA 136 10/25/2023   K 3.7 10/25/2023   CL 102 10/25/2023   CREATININE 1.14 10/25/2023   BUN 17 10/25/2023   CO2 23 10/25/2023    CT BONE TROCAR/NEEDLE BIOPSY DEEP Result Date: 10/25/2023 INDICATION: Multifocal sclerotic lesions throughout the axial skeleton. EXAM: CT-guided bone lesion biopsy TECHNIQUE: Multidetector CT imaging of the pelvis was performed following the standard protocol without IV contrast. RADIATION DOSE REDUCTION: This exam was performed according to the departmental dose-optimization program which includes automated exposure control, adjustment of the mA and/or kV according to patient size and/or use of iterative reconstruction technique. MEDICATIONS: None. ANESTHESIA/SEDATION: Moderate (conscious) sedation was employed during this procedure. A total of Versed  mg and Fentanyl  mcg was administered intravenously by the radiology nurse. Total intra-service moderate Sedation Time: minutes. The patient's level of consciousness and vital signs were monitored continuously by radiology nursing throughout the procedure under my direct supervision. COMPLICATIONS: None immediate. PROCEDURE: Informed written consent was obtained from the patient after a thorough discussion of the procedural risks, benefits and alternatives. All questions were addressed. Maximal Sterile Barrier Technique was utilized including caps, mask, sterile gowns, sterile gloves, sterile drape, hand hygiene and skin antiseptic. A timeout was performed prior to the initiation of  the procedure. The patient was placed in a prone position on the CT gantry. Initial axial images of the pelvis were obtained. The more sclerotic region of the left iliac bone was selected as the target. Radiopaque markers were placed on patient's skin. Repeat imaging was performed. Measurements for access were applied. Patient's skin was then prepped, draped, and anesthetized in the usual sterile fashion with 1% lidocaine . 13 gauge biopsy needle was then advanced through a small incision to the left iliac bone. A total of 2 samples were then obtained once the sclerotic region was engaged. The initial sclerotic focus could not be retrieved from the needle. A second sample was obtained which was easily retrieved and sent to pathology. Sterile dressing applied. No immediate complications. IMPRESSION: Satisfactory bone biopsy of a sclerotic region  in the left iliac bone. Electronically Signed   By: Cordella Banner   On: 10/25/2023 17:02   DG C-Arm 1-60 Min Result Date: 10/25/2023 CLINICAL DATA:  Right UPJ calculus with hydronephrosis. EXAM: DG C-ARM 1-60 MIN CONTRAST:  Please see operative note for detail FLUOROSCOPY: Radiation exposure index: 27.37 mGy, air kerma COMPARISON:  CT scan of the abdomen and pelvis 10/22/2023 FINDINGS: A total of 8 intraoperative saved images are submitted for review. Initial spot imaging demonstrates a radiopacity at the L3 level consistent with the known stone. Subsequent images document cannulation of the right ureteral orifice and retrograde ureteral pyelogram. There is mild hydronephrosis. The filling defect persists at the ureteropelvic junction. Some additional irregularity is present at the termination of the study likely representing either introduced bubbles or a small amount of thrombus formation. IMPRESSION: 1. At least partially obstructing right UPJ stone with associated mild to moderate hydronephrosis. 2. Right retrograde ureterogram. Electronically Signed   By: Wilkie Lent M.D.   On: 10/25/2023 10:58   US  SCROTUM W/DOPPLER Result Date: 10/23/2023 EXAM: ULTRASOUND SCROTUM/TESTICLES WITH DOPPLER FLOW EVALUATION 10/23/2023 02:13:05 PM TECHNIQUE: Duplex ultrasound using B-mode/gray scaled imaging, Doppler spectral analysis and color flow Doppler was obtained of the testicles. COMPARISON: None available. CLINICAL HISTORY: Abscess. FINDINGS: RIGHT: MEASUREMENTS: The right testicle measures 3.9 x 1.9 x 2.3 cm. GREY SCALE: The right testicle demonstrates normal homogeneous echotexture without focal lesion. No testicular microlithiasis. DOPPLER EVALUATION: There is normal arterial and venous Doppler flow within the testicle. VARICOCELE: Right varicocele noted, 3.1 mm in diameter. SCROTAL SAC: No hydrocele. EPIDIDYMIS: No acute abnormality. LEFT: MEASUREMENTS: The left testicle measures 3.5 x 2.0 x 2.1 cm. GREY SCALE: The left testicle demonstrates normal homogeneous echotexture without focal lesion. No testicular microlithiasis. DOPPLER EVALUATION: There is normal arterial and venous Doppler flow within the testicle. VARICOCELE: No scrotal varicocele. SCROTAL SAC: No hydrocele. EPIDIDYMIS: 1.1 x 1.0 x 1.0 cm left epididymal cystic lesion potentially with some minimal layering complexity but no internal blood flow noted. Likely a minimally complex epididymal cyst or spermatocele. Appearance is not characteristic of epididymal abscess. IMPRESSION: 1. No evidence of  abscess. 2. Right varicocele, 3.1 mm in diameter. 3. Left epididymal cystic lesion (likely minimally complex epididymal cyst or spermatocele), 1.1 x 1.0 x 1.0 cm. 4. Normal testicular arterial and venous flow bilaterally. Electronically signed by: Ryan Salvage MD 10/23/2023 02:40 PM EDT RP Workstation: [id number]   CT Angio Chest PE W and/or Wo Contrast Result Date: 10/23/2023 EXAM: CTA of the Chest with contrast for PE 10/23/2023 12:28:06 AM TECHNIQUE: CTA of the chest was performed without and with the  administration of 60 mL of intravenous contrast (iohexol  (OMNIPAQUE ) 350 MG/ML injection 75 mL IOHEXOL  350 MG/ML SOLN). Multiplanar reformatted images are provided for review. MIP images are provided for review. Automated exposure control, iterative reconstruction, and/or weight based adjustment of the mA/kV was utilized to reduce the radiation dose to as low as reasonably achievable. COMPARISON: None available. CLINICAL HISTORY: Pulmonary embolism (PE) suspected, high prob. F/u from CT abdomen/pelvis w/ suspicious findings; Abscess; 60 ml omni 350. FINDINGS: PULMONARY ARTERIES: Pulmonary arteries are adequately opacified for evaluation. No pulmonary embolism. Main pulmonary artery is normal in caliber. MEDIASTINUM: The heart and pericardium demonstrate no acute abnormality. There is no acute abnormality of the thoracic aorta. LYMPH NODES: Left axillary nodal metastases measuring up to 2.0 cm (image 42). 13 mm short axis left subpectoral nodal metastasis (image 32). 13 mm short axis subcarinal node (image 69). LUNGS  AND PLEURA: Mild interlobar septal thickening with subpleural nodularity in the lungs bilaterally, measuring up to 4 mm in the lingula (image 90). This is nonspecific but suspicious for perilymphatic tumor spread in this clinical setting. Trace bilateral pleural effusions, left greater than right. No pneumothorax. UPPER ABDOMEN: Limited images of the upper abdomen are unremarkable. SOFT TISSUES AND BONES: 8.3 x 3.8 cm left breast mass with overlying cutaneous thickening (image 81), suspicious for male breast cancer. Diffuse mixed sclerotic metastases throughout the visualized axial and appendicular skeleton. IMPRESSION: 1. No pulmonary embolism. 2. 8.3 cm left breast mass with overlying cutaneous thickening, suspicious for male breast cancer. 3. Left axillary, left subpectoral, and subcarinal nodal metastases. 4. Subpleural pulmonary nodularity, suspicious for metastases in this clinical setting. 5.  Diffuse osseous metastases throughout the visualized axial and appendicular skeleton. 6. Trace bilateral pleural effusions. Electronically signed by: Pinkie Pebbles MD 10/23/2023 12:36 AM EDT RP Workstation: [id number]   CT ABDOMEN PELVIS W CONTRAST Result Date: 10/22/2023 EXAM: CT ABDOMEN AND PELVIS WITH CONTRAST 10/22/2023 11:38:08 PM TECHNIQUE: CT of the abdomen and pelvis was performed with the administration of 75 mL of intravenous contrast (iohexol  (OMNIPAQUE ) 350 MG/ML injection 75 mL IOHEXOL  350 MG/ML SOLN). Multiplanar reformatted images are provided for review. Automated exposure control, iterative reconstruction, and/or weight-based adjustment of the mA/kV was utilized to reduce the radiation dose to as low as reasonably achievable. COMPARISON: None available. CLINICAL HISTORY: Concern for scrotal abscess, elevated LFT's. Pt complains of scrotal perineal abscess seen at urgent care sent in for further evaluation. FINDINGS: LOWER CHEST: Subpleural micronodularity in the lungs bilaterally, including a dominant 4 mm lingular nodule (image 1), indeterminate. Trace left pleural effusion. LIVER: Subcentimeter cyst in the left hepatic lobe (image 18), benign. GALLBLADDER AND BILE DUCTS: Gallbladder is unremarkable. No biliary ductal dilatation. SPLEEN: No acute abnormality. PANCREAS: No acute abnormality. ADRENAL GLANDS: No acute abnormality. KIDNEYS, URETERS AND BLADDER: Moderate right hydronephrosis with associated 12 mm proximal right ureteral calculus at the L3-L4 level. No stones in the left kidney or left ureter. No perinephric or periureteral stranding. Mildly thick-walled bladder, although underdistended. GI AND BOWEL: Stomach demonstrates no acute abnormality. Normal appendix (image 52). There is no bowel obstruction. PERITONEUM AND RETROPERITONEUM: No ascites. No free air. VASCULATURE: Aorta is normal in caliber. Atherosclerotic calcifications of the abdominal aorta and branch vessels, although  patent. LYMPH NODES: No lymphadenopathy. REPRODUCTIVE ORGANS: Prostatomegaly. Mild scrotal wall thickening/edema. No discrete abscess on CT. BONES AND SOFT TISSUES: Tiny fat-containing bilateral inguinal hernias. Diffuse sclerotic metastases throughout the visualized axial and appendicular skeleton. IMPRESSION: 1. Moderate right hydronephrosis with associated 12 mm proximal right ureteral calculus at the L3-L4 level. 2. Mild scrotal wall thickening/edema without discrete abscess on CT. 3. Diffuse sclerotic metastases throughout the visualized axial and appendicular skeleton. Correlate for history of malignancy, presumably prostate CA. 4. Subpleural micronodularity measuring up to 4 mm in the lingula. In the setting of suspected primary malignancy, Fleischner Society guidelines do not apply. Consider follow-up CT chest as clinically warranted. Electronically signed by: Pinkie Pebbles MD 10/22/2023 11:52 PM EDT RP Workstation: [id number]   DG Thoracic Spine W/Swimmers Result Date: 10/22/2023 CLINICAL DATA:  Back pain EXAM: THORACIC SPINE - 3 VIEWS COMPARISON:  None Available. FINDINGS: Questionable mottled appearance and sclerosis throughout the thoracic spine. No fracture or malalignment. Disc spaces are maintained. Anterior and lateral spurring. IMPRESSION: Questionable mottled/sclerotic appearance of the thoracic spine. Consider further evaluation with CT to exclude sclerotic lesions/metastases. Electronically Signed   By: Franky Crease  M.D.   On: 10/22/2023 01:03   DG Lumbar Spine Complete Result Date: 10/22/2023 CLINICAL DATA:  Back pain EXAM: LUMBAR SPINE - COMPLETE 4+ VIEW COMPARISON:  None Available. FINDINGS: Degenerative facet disease, most pronounced at L4-5 and L5-S1. 4 mm anterolisthesis of L4 on L5. Disc spaces are maintained. No fracture. IMPRESSION: Degenerative facet disease in the lower lumbar spine. No acute bony abnormality. Electronically Signed   By: Franky Crease M.D.   On: 10/22/2023  00:58    Pathology: Pending  CT BONE TROCAR/NEEDLE BIOPSY DEEP Result Date: 10/25/2023 INDICATION: Multifocal sclerotic lesions throughout the axial skeleton. EXAM: CT-guided bone lesion biopsy TECHNIQUE: Multidetector CT imaging of the pelvis was performed following the standard protocol without IV contrast. RADIATION DOSE REDUCTION: This exam was performed according to the departmental dose-optimization program which includes automated exposure control, adjustment of the mA and/or kV according to patient size and/or use of iterative reconstruction technique. MEDICATIONS: None. ANESTHESIA/SEDATION: Moderate (conscious) sedation was employed during this procedure. A total of Versed  mg and Fentanyl  mcg was administered intravenously by the radiology nurse. Total intra-service moderate Sedation Time: minutes. The patient's level of consciousness and vital signs were monitored continuously by radiology nursing throughout the procedure under my direct supervision. COMPLICATIONS: None immediate. PROCEDURE: Informed written consent was obtained from the patient after a thorough discussion of the procedural risks, benefits and alternatives. All questions were addressed. Maximal Sterile Barrier Technique was utilized including caps, mask, sterile gowns, sterile gloves, sterile drape, hand hygiene and skin antiseptic. A timeout was performed prior to the initiation of the procedure. The patient was placed in a prone position on the CT gantry. Initial axial images of the pelvis were obtained. The more sclerotic region of the left iliac bone was selected as the target. Radiopaque markers were placed on patient's skin. Repeat imaging was performed. Measurements for access were applied. Patient's skin was then prepped, draped, and anesthetized in the usual sterile fashion with 1% lidocaine . 13 gauge biopsy needle was then advanced through a small incision to the left iliac bone. A total of 2 samples were then obtained  once the sclerotic region was engaged. The initial sclerotic focus could not be retrieved from the needle. A second sample was obtained which was easily retrieved and sent to pathology. Sterile dressing applied. No immediate complications. IMPRESSION: Satisfactory bone biopsy of a sclerotic region in the left iliac bone. Electronically Signed   By: Cordella Banner   On: 10/25/2023 17:02   DG C-Arm 1-60 Min Result Date: 10/25/2023 CLINICAL DATA:  Right UPJ calculus with hydronephrosis. EXAM: DG C-ARM 1-60 MIN CONTRAST:  Please see operative note for detail FLUOROSCOPY: Radiation exposure index: 27.37 mGy, air kerma COMPARISON:  CT scan of the abdomen and pelvis 10/22/2023 FINDINGS: A total of 8 intraoperative saved images are submitted for review. Initial spot imaging demonstrates a radiopacity at the L3 level consistent with the known stone. Subsequent images document cannulation of the right ureteral orifice and retrograde ureteral pyelogram. There is mild hydronephrosis. The filling defect persists at the ureteropelvic junction. Some additional irregularity is present at the termination of the study likely representing either introduced bubbles or a small amount of thrombus formation. IMPRESSION: 1. At least partially obstructing right UPJ stone with associated mild to moderate hydronephrosis. 2. Right retrograde ureterogram. Electronically Signed   By: Wilkie Lent M.D.   On: 10/25/2023 10:58   US  SCROTUM W/DOPPLER Result Date: 10/23/2023 EXAM: ULTRASOUND SCROTUM/TESTICLES WITH DOPPLER FLOW EVALUATION 10/23/2023 02:13:05 PM TECHNIQUE: Duplex ultrasound  using B-mode/gray scaled imaging, Doppler spectral analysis and color flow Doppler was obtained of the testicles. COMPARISON: None available. CLINICAL HISTORY: Abscess. FINDINGS: RIGHT: MEASUREMENTS: The right testicle measures 3.9 x 1.9 x 2.3 cm. GREY SCALE: The right testicle demonstrates normal homogeneous echotexture without focal lesion. No  testicular microlithiasis. DOPPLER EVALUATION: There is normal arterial and venous Doppler flow within the testicle. VARICOCELE: Right varicocele noted, 3.1 mm in diameter. SCROTAL SAC: No hydrocele. EPIDIDYMIS: No acute abnormality. LEFT: MEASUREMENTS: The left testicle measures 3.5 x 2.0 x 2.1 cm. GREY SCALE: The left testicle demonstrates normal homogeneous echotexture without focal lesion. No testicular microlithiasis. DOPPLER EVALUATION: There is normal arterial and venous Doppler flow within the testicle. VARICOCELE: No scrotal varicocele. SCROTAL SAC: No hydrocele. EPIDIDYMIS: 1.1 x 1.0 x 1.0 cm left epididymal cystic lesion potentially with some minimal layering complexity but no internal blood flow noted. Likely a minimally complex epididymal cyst or spermatocele. Appearance is not characteristic of epididymal abscess. IMPRESSION: 1. No evidence of  abscess. 2. Right varicocele, 3.1 mm in diameter. 3. Left epididymal cystic lesion (likely minimally complex epididymal cyst or spermatocele), 1.1 x 1.0 x 1.0 cm. 4. Normal testicular arterial and venous flow bilaterally. Electronically signed by: Ryan Salvage MD 10/23/2023 02:40 PM EDT RP Workstation: [id number]   CT Angio Chest PE W and/or Wo Contrast Result Date: 10/23/2023 EXAM: CTA of the Chest with contrast for PE 10/23/2023 12:28:06 AM TECHNIQUE: CTA of the chest was performed without and with the administration of 60 mL of intravenous contrast (iohexol  (OMNIPAQUE ) 350 MG/ML injection 75 mL IOHEXOL  350 MG/ML SOLN). Multiplanar reformatted images are provided for review. MIP images are provided for review. Automated exposure control, iterative reconstruction, and/or weight based adjustment of the mA/kV was utilized to reduce the radiation dose to as low as reasonably achievable. COMPARISON: None available. CLINICAL HISTORY: Pulmonary embolism (PE) suspected, high prob. F/u from CT abdomen/pelvis w/ suspicious findings; Abscess; 60 ml omni 350.  FINDINGS: PULMONARY ARTERIES: Pulmonary arteries are adequately opacified for evaluation. No pulmonary embolism. Main pulmonary artery is normal in caliber. MEDIASTINUM: The heart and pericardium demonstrate no acute abnormality. There is no acute abnormality of the thoracic aorta. LYMPH NODES: Left axillary nodal metastases measuring up to 2.0 cm (image 42). 13 mm short axis left subpectoral nodal metastasis (image 32). 13 mm short axis subcarinal node (image 69). LUNGS AND PLEURA: Mild interlobar septal thickening with subpleural nodularity in the lungs bilaterally, measuring up to 4 mm in the lingula (image 90). This is nonspecific but suspicious for perilymphatic tumor spread in this clinical setting. Trace bilateral pleural effusions, left greater than right. No pneumothorax. UPPER ABDOMEN: Limited images of the upper abdomen are unremarkable. SOFT TISSUES AND BONES: 8.3 x 3.8 cm left breast mass with overlying cutaneous thickening (image 81), suspicious for male breast cancer. Diffuse mixed sclerotic metastases throughout the visualized axial and appendicular skeleton. IMPRESSION: 1. No pulmonary embolism. 2. 8.3 cm left breast mass with overlying cutaneous thickening, suspicious for male breast cancer. 3. Left axillary, left subpectoral, and subcarinal nodal metastases. 4. Subpleural pulmonary nodularity, suspicious for metastases in this clinical setting. 5. Diffuse osseous metastases throughout the visualized axial and appendicular skeleton. 6. Trace bilateral pleural effusions. Electronically signed by: Pinkie Pebbles MD 10/23/2023 12:36 AM EDT RP Workstation: [id number]   CT ABDOMEN PELVIS W CONTRAST Result Date: 10/22/2023 EXAM: CT ABDOMEN AND PELVIS WITH CONTRAST 10/22/2023 11:38:08 PM TECHNIQUE: CT of the abdomen and pelvis was performed with the administration of 75 mL of intravenous  contrast (iohexol  (OMNIPAQUE ) 350 MG/ML injection 75 mL IOHEXOL  350 MG/ML SOLN). Multiplanar reformatted images  are provided for review. Automated exposure control, iterative reconstruction, and/or weight-based adjustment of the mA/kV was utilized to reduce the radiation dose to as low as reasonably achievable. COMPARISON: None available. CLINICAL HISTORY: Concern for scrotal abscess, elevated LFT's. Pt complains of scrotal perineal abscess seen at urgent care sent in for further evaluation. FINDINGS: LOWER CHEST: Subpleural micronodularity in the lungs bilaterally, including a dominant 4 mm lingular nodule (image 1), indeterminate. Trace left pleural effusion. LIVER: Subcentimeter cyst in the left hepatic lobe (image 18), benign. GALLBLADDER AND BILE DUCTS: Gallbladder is unremarkable. No biliary ductal dilatation. SPLEEN: No acute abnormality. PANCREAS: No acute abnormality. ADRENAL GLANDS: No acute abnormality. KIDNEYS, URETERS AND BLADDER: Moderate right hydronephrosis with associated 12 mm proximal right ureteral calculus at the L3-L4 level. No stones in the left kidney or left ureter. No perinephric or periureteral stranding. Mildly thick-walled bladder, although underdistended. GI AND BOWEL: Stomach demonstrates no acute abnormality. Normal appendix (image 52). There is no bowel obstruction. PERITONEUM AND RETROPERITONEUM: No ascites. No free air. VASCULATURE: Aorta is normal in caliber. Atherosclerotic calcifications of the abdominal aorta and branch vessels, although patent. LYMPH NODES: No lymphadenopathy. REPRODUCTIVE ORGANS: Prostatomegaly. Mild scrotal wall thickening/edema. No discrete abscess on CT. BONES AND SOFT TISSUES: Tiny fat-containing bilateral inguinal hernias. Diffuse sclerotic metastases throughout the visualized axial and appendicular skeleton. IMPRESSION: 1. Moderate right hydronephrosis with associated 12 mm proximal right ureteral calculus at the L3-L4 level. 2. Mild scrotal wall thickening/edema without discrete abscess on CT. 3. Diffuse sclerotic metastases throughout the visualized axial and  appendicular skeleton. Correlate for history of malignancy, presumably prostate CA. 4. Subpleural micronodularity measuring up to 4 mm in the lingula. In the setting of suspected primary malignancy, Fleischner Society guidelines do not apply. Consider follow-up CT chest as clinically warranted. Electronically signed by: Pinkie Pebbles MD 10/22/2023 11:52 PM EDT RP Workstation: [id number]   DG Thoracic Spine W/Swimmers Result Date: 10/22/2023 CLINICAL DATA:  Back pain EXAM: THORACIC SPINE - 3 VIEWS COMPARISON:  None Available. FINDINGS: Questionable mottled appearance and sclerosis throughout the thoracic spine. No fracture or malalignment. Disc spaces are maintained. Anterior and lateral spurring. IMPRESSION: Questionable mottled/sclerotic appearance of the thoracic spine. Consider further evaluation with CT to exclude sclerotic lesions/metastases. Electronically Signed   By: Franky Crease M.D.   On: 10/22/2023 01:03   DG Lumbar Spine Complete Result Date: 10/22/2023 CLINICAL DATA:  Back pain EXAM: LUMBAR SPINE - COMPLETE 4+ VIEW COMPARISON:  None Available. FINDINGS: Degenerative facet disease, most pronounced at L4-5 and L5-S1. 4 mm anterolisthesis of L4 on L5. Disc spaces are maintained. No fracture. IMPRESSION: Degenerative facet disease in the lower lumbar spine. No acute bony abnormality. Electronically Signed   By: Franky Crease M.D.   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  He tells me that the left breast mass has been  growing for years.  He did not think much of it and did not seek any medical attention.  He does not see a doctor on a regular basis.  He has a chronic alcohol  user, drinks about 3 glasses of whiskey every day and has been drinking for the past several years.  He has no family history of breast cancer, ovarian cancer, pancreatic cancer or melanoma.  He had bone biopsy today.  Urology is also actively following him for his scrotal abscess, right proximal ureteral calculus.  At this time I have recommended outpatient follow-up.  Will arrange for him to have a follow-up in my clinic.  Have strongly encouraged him to answer all the phone calls from the hospital so he will not miss the details about the appointment.  I have discussed that this is concerning for metastatic breast cancer and if not attended to, given the cutaneous involvement, the tumor will cause an ulcerative lesion and may cause significant amount of pain.  We did not discuss any treatment details given lack of pathology. I will also arrange for outpatient genetic testing when he sees me. The length of time of the face-to-face encounter was 55 minutes. More than 50% of time was spent counseling and coordination of care.     Thank you for this referral.

## 2023-10-25 NOTE — Assessment & Plan Note (Signed)
 HTN: amlodipine  5 mg every day  History of CVA: aspirin  81 mg every day, lipitor  80 mg every day  T2DM: last A1c on 09/26/23 7.1

## 2023-10-25 NOTE — Assessment & Plan Note (Addendum)
 CTAP with R moderate hydronephrosis and 12 mm proximal right ureteral calculus at the level of L3-L4.  - Urology consulted as above, appreciate recommendations  - Unable to place stent   - Ordered Nephrostomy tube placement by IR

## 2023-10-25 NOTE — Consult Note (Signed)
 Chief Complaint: Patient was seen in consultation today for right hydronephrosis - for right percutaneous nephrostomy placement Chief Complaint  Patient presents with   Abscess   at the request of Dr KATHEE Manly  Supervising Physician: Jenna Hacker  Patient Status: [facility] - In-pt  History of Present Illness: Larry Houston is a 68 y.o. male   FULL Code status per pt  Right ureteral calculus/ Hydronephrosis; scrotal/perineal abscess Follows with Urology Dr Manly: 10/16 cystoscopy, right retrograde pyelogram, insertion of right ureteral stent, and incision and drainage of scrotal/perineal abscess Unable to place R ureteral stent yesterday-- I/D of abscess was performed Urology has asked IR to place R Percutaneous nephrostomy for decompression Approved with Dr Jennefer Planned for IR today   Past Medical History:  Diagnosis Date   Alcohol  abuse    Arthritis    lumbar stenosis    Hypertension    Scrotal abscess    Stroke [facility])    Venous malformation 09/03/2016   Right lower extremity venous malformation    Past Surgical History:  Procedure Laterality Date   CYSTOSCOPY W/ URETERAL STENT PLACEMENT Right 10/24/2023   Procedure: CYSTOSCOPY, WITH RIGHT RETROGRADE PYELOGRAM;  Surgeon: Manly Adine PARAS., MD;  Location: [facility] OR;  Service: Urology;  Laterality: Right;  POSSIBLE LASER LIPOTRIPSY   IR RADIOLOGIST EVAL & MGMT  04/05/2016   IR RADIOLOGIST EVAL & MGMT  10/17/2016   IR RADIOLOGIST EVAL & MGMT  12/05/2016   IR SCLEROTHERAPY OF A FLUID COLLECTION Right 09/13/2016   Venous malformation [Q27.9]   RLE   IR SCLEROTHERAPY OF A FLUID COLLECTION  09/13/2016   IR US  GUIDE VASC ACCESS RIGHT  09/13/2016   LUMBAR LAMINECTOMY/DECOMPRESSION MICRODISCECTOMY Bilateral 01/26/2015   Procedure: Laminectomy and Foraminotomy - L4-L5 - bilateral;  Surgeon: Alm GORMAN Molt, MD;  Location: [facility] NEURO ORS;  Service: Neurosurgery;  Laterality: Bilateral;  Laminectomy and Foraminotomy - L4-L5 -  bilateral   NASAL ENDOSCOPY WITH EPISTAXIS CONTROL N/A 04/04/2014   Procedure: NASAL ENDOSCOPY WITH EPISTAXIS CONTROL;  Surgeon: Merilee Kraft, MD;  Location: [facility] OR;  Service: ENT;  Laterality: N/A;   NECK SURGERY  1992   posterior fusion- cervical    RADIOLOGY WITH ANESTHESIA N/A 09/13/2016   Procedure: percutaneous sclerotherapy with dehydrated ethanol;  Surgeon: Karalee Beat, MD;  Location: [facility] OR;  Service: Radiology;  Laterality: N/A;   SCROTAL EXPLORATION N/A 10/24/2023   Procedure: EXPLORATION, SCROTUM;  Surgeon: Manly Adine PARAS., MD;  Location: [facility] OR;  Service: Urology;  Laterality: N/A;  IRRIGATION AND DEBRIDEMENT SCROTUM ABSCESS    Allergies: Penicillins  Medications: Prior to Admission medications   Medication Sig Start Date End Date Taking? Authorizing Provider  amLODipine  (NORVASC ) 5 MG tablet Take 1 tablet (5 mg total) by mouth daily before breakfast. 07/23/19  Yes Danton Reyes DASEN, MD  aspirin  EC 81 MG tablet Take 1 tablet (81 mg total) by mouth daily. Swallow whole. 10/14/23  Yes Larraine Palma, MD  atorvastatin  (LIPITOR ) 80 MG tablet Take 1 tablet (80 mg total) by mouth daily. 07/24/19  Yes Danton Reyes DASEN, MD  clindamycin  (CLEOCIN  T) 1 % external solution Apply 1 Application topically 2 (two) times daily. 09/16/23  Yes   gabapentin  (NEURONTIN ) 300 MG capsule Take 1 capsule (300 mg total) by mouth 2 (two) times daily. 10/14/23  Yes Larraine Palma, MD     Family History  Problem Relation Age of Onset   Diabetes Mother    Hypertension Mother    Anemia Mother  Died of aplastic anemia   Lung disease Father    Heart attack Brother    Heart disease Sister        One sister had heart arrest    Social History   Socioeconomic History   Marital status: Divorced    Spouse name: Not on file   Number of children: Not on file   Years of education: Not on file   Highest education level: Not on file  Occupational History   Not on file  Tobacco Use    Smoking status: Every Day    Current packs/day: 0.00    Types: Cigarettes    Start date: 01/14/1999    Last attempt to quit: 01/13/2006    Years since quitting: 17.7   Smokeless tobacco: Never  Vaping Use   Vaping status: Never Used  Substance and Sexual Activity   Alcohol  use: Yes    Comment: occasional    Drug use: No   Sexual activity: Not on file  Other Topics Concern   Not on file  Social History Narrative   Not on file   Social Drivers of Health   Financial Resource Strain: Not on file  Food Insecurity: No Food Insecurity (10/23/2023)   Hunger Vital Sign    Worried About Running Out of Food in the Last Year: Never true    Ran Out of Food in the Last Year: Never true  Transportation Needs: No Transportation Needs (10/23/2023)   PRAPARE - Administrator, Civil Service (Medical): No    Lack of Transportation (Non-Medical): No  Physical Activity: Not on file  Stress: Not on file  Social Connections: Socially Isolated (10/23/2023)   Social Connection and Isolation Panel    Frequency of Communication with Friends and Family: Once a week    Frequency of Social Gatherings with Friends and Family: Never    Attends Religious Services: Never    Database administrator or Organizations: Yes    Attends Banker Meetings: Never    Marital Status: Divorced    Review of Systems: A 12 point ROS discussed and pertinent positives are indicated in the HPI above.  All other systems are negative.  Review of Systems  Constitutional:  Positive for activity change. Negative for fever.  Respiratory:  Negative for cough and shortness of breath.   Cardiovascular:  Negative for chest pain.  Gastrointestinal:  Positive for abdominal pain and nausea.  Genitourinary:  Positive for scrotal swelling.  Neurological:  Negative for weakness.  Psychiatric/Behavioral:  Negative for behavioral problems and confusion.     Vital Signs: BP (!) 141/82 (BP Location: Left Arm)    Pulse 82   Temp 98.3 F (36.8 C)   Resp 18   Ht 6' (1.829 m)   Wt 176 lb (79.8 kg)   SpO2 100%   BMI 23.87 kg/m   Advance Care Plan: The advanced care plan/surrogate decision maker was discussed at the time of visit and documented in the medical record.    Physical Exam Vitals reviewed.  Constitutional:      Appearance: He is ill-appearing.  HENT:     Mouth/Throat:     Mouth: Mucous membranes are moist.  Cardiovascular:     Rate and Rhythm: Normal rate and regular rhythm.     Heart sounds: Normal heart sounds. No murmur heard. Pulmonary:     Effort: Pulmonary effort is normal.     Breath sounds: Normal breath sounds. No wheezing.  Abdominal:  Palpations: Abdomen is soft.  Musculoskeletal:        General: Normal range of motion.  Skin:    General: Skin is warm.  Neurological:     Mental Status: He is alert and oriented to person, place, and time.  Psychiatric:        Behavior: Behavior normal.     Imaging: US  SCROTUM W/DOPPLER Result Date: 10/23/2023 EXAM: ULTRASOUND SCROTUM/TESTICLES WITH DOPPLER FLOW EVALUATION 10/23/2023 02:13:05 PM TECHNIQUE: Duplex ultrasound using B-mode/gray scaled imaging, Doppler spectral analysis and color flow Doppler was obtained of the testicles. COMPARISON: None available. CLINICAL HISTORY: Abscess. FINDINGS: RIGHT: MEASUREMENTS: The right testicle measures 3.9 x 1.9 x 2.3 cm. GREY SCALE: The right testicle demonstrates normal homogeneous echotexture without focal lesion. No testicular microlithiasis. DOPPLER EVALUATION: There is normal arterial and venous Doppler flow within the testicle. VARICOCELE: Right varicocele noted, 3.1 mm in diameter. SCROTAL SAC: No hydrocele. EPIDIDYMIS: No acute abnormality. LEFT: MEASUREMENTS: The left testicle measures 3.5 x 2.0 x 2.1 cm. GREY SCALE: The left testicle demonstrates normal homogeneous echotexture without focal lesion. No testicular microlithiasis. DOPPLER EVALUATION: There is normal arterial and  venous Doppler flow within the testicle. VARICOCELE: No scrotal varicocele. SCROTAL SAC: No hydrocele. EPIDIDYMIS: 1.1 x 1.0 x 1.0 cm left epididymal cystic lesion potentially with some minimal layering complexity but no internal blood flow noted. Likely a minimally complex epididymal cyst or spermatocele. Appearance is not characteristic of epididymal abscess. IMPRESSION: 1. No evidence of  abscess. 2. Right varicocele, 3.1 mm in diameter. 3. Left epididymal cystic lesion (likely minimally complex epididymal cyst or spermatocele), 1.1 x 1.0 x 1.0 cm. 4. Normal testicular arterial and venous flow bilaterally. Electronically signed by: Ryan Salvage MD 10/23/2023 02:40 PM EDT RP Workstation: [id number]   CT Angio Chest PE W and/or Wo Contrast Result Date: 10/23/2023 EXAM: CTA of the Chest with contrast for PE 10/23/2023 12:28:06 AM TECHNIQUE: CTA of the chest was performed without and with the administration of 60 mL of intravenous contrast (iohexol  (OMNIPAQUE ) 350 MG/ML injection 75 mL IOHEXOL  350 MG/ML SOLN). Multiplanar reformatted images are provided for review. MIP images are provided for review. Automated exposure control, iterative reconstruction, and/or weight based adjustment of the mA/kV was utilized to reduce the radiation dose to as low as reasonably achievable. COMPARISON: None available. CLINICAL HISTORY: Pulmonary embolism (PE) suspected, high prob. F/u from CT abdomen/pelvis w/ suspicious findings; Abscess; 60 ml omni 350. FINDINGS: PULMONARY ARTERIES: Pulmonary arteries are adequately opacified for evaluation. No pulmonary embolism. Main pulmonary artery is normal in caliber. MEDIASTINUM: The heart and pericardium demonstrate no acute abnormality. There is no acute abnormality of the thoracic aorta. LYMPH NODES: Left axillary nodal metastases measuring up to 2.0 cm (image 42). 13 mm short axis left subpectoral nodal metastasis (image 32). 13 mm short axis subcarinal node (image 69). LUNGS AND  PLEURA: Mild interlobar septal thickening with subpleural nodularity in the lungs bilaterally, measuring up to 4 mm in the lingula (image 90). This is nonspecific but suspicious for perilymphatic tumor spread in this clinical setting. Trace bilateral pleural effusions, left greater than right. No pneumothorax. UPPER ABDOMEN: Limited images of the upper abdomen are unremarkable. SOFT TISSUES AND BONES: 8.3 x 3.8 cm left breast mass with overlying cutaneous thickening (image 81), suspicious for male breast cancer. Diffuse mixed sclerotic metastases throughout the visualized axial and appendicular skeleton. IMPRESSION: 1. No pulmonary embolism. 2. 8.3 cm left breast mass with overlying cutaneous thickening, suspicious for male breast cancer. 3. Left axillary, left subpectoral,  and subcarinal nodal metastases. 4. Subpleural pulmonary nodularity, suspicious for metastases in this clinical setting. 5. Diffuse osseous metastases throughout the visualized axial and appendicular skeleton. 6. Trace bilateral pleural effusions. Electronically signed by: Pinkie Pebbles MD 10/23/2023 12:36 AM EDT RP Workstation: [id number]   CT ABDOMEN PELVIS W CONTRAST Result Date: 10/22/2023 EXAM: CT ABDOMEN AND PELVIS WITH CONTRAST 10/22/2023 11:38:08 PM TECHNIQUE: CT of the abdomen and pelvis was performed with the administration of 75 mL of intravenous contrast (iohexol  (OMNIPAQUE ) 350 MG/ML injection 75 mL IOHEXOL  350 MG/ML SOLN). Multiplanar reformatted images are provided for review. Automated exposure control, iterative reconstruction, and/or weight-based adjustment of the mA/kV was utilized to reduce the radiation dose to as low as reasonably achievable. COMPARISON: None available. CLINICAL HISTORY: Concern for scrotal abscess, elevated LFT's. Pt complains of scrotal perineal abscess seen at urgent care sent in for further evaluation. FINDINGS: LOWER CHEST: Subpleural micronodularity in the lungs bilaterally, including a dominant  4 mm lingular nodule (image 1), indeterminate. Trace left pleural effusion. LIVER: Subcentimeter cyst in the left hepatic lobe (image 18), benign. GALLBLADDER AND BILE DUCTS: Gallbladder is unremarkable. No biliary ductal dilatation. SPLEEN: No acute abnormality. PANCREAS: No acute abnormality. ADRENAL GLANDS: No acute abnormality. KIDNEYS, URETERS AND BLADDER: Moderate right hydronephrosis with associated 12 mm proximal right ureteral calculus at the L3-L4 level. No stones in the left kidney or left ureter. No perinephric or periureteral stranding. Mildly thick-walled bladder, although underdistended. GI AND BOWEL: Stomach demonstrates no acute abnormality. Normal appendix (image 52). There is no bowel obstruction. PERITONEUM AND RETROPERITONEUM: No ascites. No free air. VASCULATURE: Aorta is normal in caliber. Atherosclerotic calcifications of the abdominal aorta and branch vessels, although patent. LYMPH NODES: No lymphadenopathy. REPRODUCTIVE ORGANS: Prostatomegaly. Mild scrotal wall thickening/edema. No discrete abscess on CT. BONES AND SOFT TISSUES: Tiny fat-containing bilateral inguinal hernias. Diffuse sclerotic metastases throughout the visualized axial and appendicular skeleton. IMPRESSION: 1. Moderate right hydronephrosis with associated 12 mm proximal right ureteral calculus at the L3-L4 level. 2. Mild scrotal wall thickening/edema without discrete abscess on CT. 3. Diffuse sclerotic metastases throughout the visualized axial and appendicular skeleton. Correlate for history of malignancy, presumably prostate CA. 4. Subpleural micronodularity measuring up to 4 mm in the lingula. In the setting of suspected primary malignancy, Fleischner Society guidelines do not apply. Consider follow-up CT chest as clinically warranted. Electronically signed by: Pinkie Pebbles MD 10/22/2023 11:52 PM EDT RP Workstation: [id number]   DG Thoracic Spine W/Swimmers Result Date: 10/22/2023 CLINICAL DATA:  Back pain EXAM:  THORACIC SPINE - 3 VIEWS COMPARISON:  None Available. FINDINGS: Questionable mottled appearance and sclerosis throughout the thoracic spine. No fracture or malalignment. Disc spaces are maintained. Anterior and lateral spurring. IMPRESSION: Questionable mottled/sclerotic appearance of the thoracic spine. Consider further evaluation with CT to exclude sclerotic lesions/metastases. Electronically Signed   By: Franky Crease M.D.   On: 10/22/2023 01:03   DG Lumbar Spine Complete Result Date: 10/22/2023 CLINICAL DATA:  Back pain EXAM: LUMBAR SPINE - COMPLETE 4+ VIEW COMPARISON:  None Available. FINDINGS: Degenerative facet disease, most pronounced at L4-5 and L5-S1. 4 mm anterolisthesis of L4 on L5. Disc spaces are maintained. No fracture. IMPRESSION: Degenerative facet disease in the lower lumbar spine. No acute bony abnormality. Electronically Signed   By: Franky Crease M.D.   On: 10/22/2023 00:58    Labs:  CBC: Recent Labs    10/23/23 0452 10/23/23 0619 10/23/23 2016 10/24/23 0750 10/25/23 0311  WBC 7.3 7.8  --  6.5 6.9  HGB 6.6*  6.7* 9.1* 9.3* 8.9*  HCT 20.1* 21.3* 27.2* 27.7* 26.1*  PLT 92* 97*  --  95* 101*    COAGS: No results for input(s): INR, APTT in the last 8760 hours.  BMP: Recent Labs    10/23/23 0453 10/24/23 0750 10/24/23 1157 10/25/23 0311  NA 139 138 138 136  K 3.1* 3.9 3.6 3.7  CL 105 101 103 102  CO2 25 25 24 23   GLUCOSE 223* 112* 107* 195*  BUN 10 10 10 17   CALCIUM  8.6* 9.1 8.8* 8.6*  CREATININE 1.12 1.02 0.98 1.14  GFRNONAA >60 >60 >60 >60    LIVER FUNCTION TESTS: Recent Labs    10/22/23 2230 10/23/23 0453 10/24/23 1157 10/25/23 0311  BILITOT 1.4* 1.3* 1.8* 1.4*  AST 227* 205* 298* 245*  ALT 141* 136* 226* 211*  ALKPHOS 491* 451* 487* 508*  PROT 6.9 6.4* 7.1 7.1  ALBUMIN 2.8* 2.4* 2.7* 2.5*    TUMOR MARKERS: No results for input(s): AFPTM, CEA, CA199, CHROMGRNA in the last 8760 hours.  Assessment and Plan:  Right ureteral  stone; Right hydronephrosis Urology unable to place ureteral stent 10/16--- now planned for IR to plac Rt Percutaneous Nephrostomy Risks and benefits of Right PCN placement was discussed with the patient including, but not limited to, infection, bleeding, significant bleeding causing loss or decrease in renal function or damage to adjacent structures.   All of the patient's questions were answered, patient is agreeable to proceed.  Consent signed and in chart.  Thank you for this interesting consult.  I greatly enjoyed meeting Larry Houston and look forward to participating in their care.  A copy of this report was sent to the requesting provider on this date.  Electronically Signed: Sharlet DELENA Candle, PA-C 10/25/2023, 8:22 AM   I spent a total of 40 Minutes    in face to face in clinical consultation, greater than 50% of which was counseling/coordinating care for R PCN

## 2023-10-25 NOTE — Assessment & Plan Note (Addendum)
 LFTs downtrending.  AST/ALT elevated likely secondary to alcohol  use.  Hepatitis panel nonreactive.  No known history of alcohol  withdrawal or seizures. -CIWA protocol -Nutritional supplements including thiamine  - AM CMP

## 2023-10-25 NOTE — Assessment & Plan Note (Addendum)
 Hemoglobin 8.9, s/p 1 unit PRBCs 10/15.  - Will monitor  - AM CBC

## 2023-10-25 NOTE — Assessment & Plan Note (Addendum)
 Afebrile.  S/p 1 day I&D by Urology.  - Antibiotics: IV Vancomycin  750 mg Q12h, IV cefepime 2 g Q8h, and add oral metronidazole  500 mg Q12h - Urology consulted, appreciate recommendations - Blood cultures no growth 3 days -Nausea: Reglan 5 mg every 8 hours as needed - AM CBC, BMP

## 2023-10-25 NOTE — Consult Note (Incomplete)
 [facility]  [phone] [phone]   MEDICAL ONCOLOGY - INITIAL CONSULTATION    Referral MD  Reason for Referral: Male breast cancer  Chief Complaint  Patient presents with   Abscess    HPI:       Past Medical History:  Diagnosis Date   Alcohol  abuse    Arthritis    lumbar stenosis    Hypertension    Scrotal abscess    Stroke [facility])    Venous malformation 09/03/2016   Right lower extremity venous malformation  :   Past Surgical History:  Procedure Laterality Date   CYSTOSCOPY W/ URETERAL STENT PLACEMENT Right 10/24/2023   Procedure: CYSTOSCOPY, WITH RIGHT RETROGRADE PYELOGRAM;  Surgeon: Roseann Adine PARAS., MD;  Location: [facility] OR;  Service: Urology;  Laterality: Right;  POSSIBLE LASER LIPOTRIPSY   IR RADIOLOGIST EVAL & MGMT  04/05/2016   IR RADIOLOGIST EVAL & MGMT  10/17/2016   IR RADIOLOGIST EVAL & MGMT  12/05/2016   IR SCLEROTHERAPY OF A FLUID COLLECTION Right 09/13/2016   Venous malformation [Q27.9]   RLE   IR SCLEROTHERAPY OF A FLUID COLLECTION  09/13/2016   IR US  GUIDE VASC ACCESS RIGHT  09/13/2016   LUMBAR LAMINECTOMY/DECOMPRESSION MICRODISCECTOMY Bilateral 01/26/2015   Procedure: Laminectomy and Foraminotomy - L4-L5 - bilateral;  Surgeon: Alm GORMAN Molt, MD;  Location: [facility] NEURO ORS;  Service: Neurosurgery;  Laterality: Bilateral;  Laminectomy and Foraminotomy - L4-L5 - bilateral   NASAL ENDOSCOPY WITH EPISTAXIS CONTROL N/A 04/04/2014   Procedure: NASAL ENDOSCOPY WITH EPISTAXIS CONTROL;  Surgeon: Merilee Kraft, MD;  Location: [facility] OR;  Service: ENT;  Laterality: N/A;   NECK SURGERY  1992   posterior fusion- cervical    RADIOLOGY WITH ANESTHESIA N/A 09/13/2016   Procedure: percutaneous sclerotherapy with dehydrated ethanol;  Surgeon: Karalee Beat, MD;  Location: [facility] OR;  Service: Radiology;  Laterality: N/A;   SCROTAL EXPLORATION N/A 10/24/2023   Procedure: EXPLORATION, SCROTUM;  Surgeon: Roseann Adine PARAS., MD;  Location:  [facility] OR;  Service: Urology;  Laterality: N/A;  IRRIGATION AND DEBRIDEMENT SCROTUM ABSCESS  :   Current Facility-Administered Medications  Medication Dose Route Frequency Provider Last Rate Last Admin   acetaminophen  (OFIRMEV ) IV 1,000 mg  1,000 mg Intravenous Q6H Baker, Amelia G, DO 400 mL/hr at 10/25/23 1718 1,000 mg at 10/25/23 1718   [START ON 10/26/2023] aspirin  EC tablet 81 mg  81 mg Oral Daily Marten Peat M, RPH       atorvastatin  (LIPITOR ) tablet 80 mg  80 mg Oral Daily Stoneking, Bradley J., MD   80 mg at 10/25/23 1712   ciprofloxacin  (CIPRO ) IVPB 400 mg  400 mg Intravenous Q12H Cleotilde Lukes, DO       feeding supplement (ENSURE PLUS HIGH PROTEIN) liquid 237 mL  237 mL Oral BID BM Roseann Adine PARAS., MD   237 mL at 10/25/23 1712   folic acid  (FOLVITE ) tablet 1 mg  1 mg Oral Daily Stoneking, Bradley J., MD   1 mg at 10/25/23 1713   folic acid  injection 1 mg  1 mg Intravenous Once McIntyre, Brittany J, MD       gabapentin  (NEURONTIN ) capsule 300 mg  300 mg Oral BID Theophilus Pagan, MD       insulin aspart (novoLOG) injection 0-9 Units  0-9 Units Subcutaneous TID WC Roseann Adine PARAS., MD   2 Units at 10/25/23 1755   metroNIDAZOLE  (FLAGYL ) IVPB 500 mg  500 mg Intravenous Q12H Cleotilde Lukes, DO  morphine  (PF) 2 MG/ML injection 2 mg  2 mg Intravenous Q4H PRN Baker, Amelia G, DO       multivitamin with minerals tablet 1 tablet  1 tablet Oral Daily Stoneking, Adine PARAS., MD   1 tablet at 10/25/23 1715   nicotine (NICODERM CQ - dosed in mg/24 hours) patch 14 mg  14 mg Transdermal Daily Roseann Adine PARAS., MD   14 mg at 10/25/23 9192   oxyCODONE  (Oxy IR/ROXICODONE ) immediate release tablet 5 mg  5 mg Oral Q4H PRN Roseann Adine PARAS., MD   5 mg at 10/24/23 2045   polyethylene glycol (MIRALAX / GLYCOLAX) packet 17 g  17 g Oral Daily Roseann Adine PARAS., MD   17 g at 10/25/23 1716   senna (SENOKOT) tablet 8.6 mg  1 tablet Oral Daily Roseann Adine PARAS., MD   8.6 mg at  10/25/23 1716   thiamine  (VITAMIN B1) tablet 100 mg  100 mg Oral Daily Roseann Adine PARAS., MD   100 mg at 10/23/23 0940   Or   thiamine  (VITAMIN B1) injection 100 mg  100 mg Intravenous Daily Roseann Adine PARAS., MD   100 mg at 10/25/23 0801      Allergies  Allergen Reactions   Penicillins Hives    Has patient had a PCN reaction causing immediate rash, facial/tongue/throat swelling, SOB or lightheadedness with hypotension: No Has patient had a PCN reaction causing severe rash involving mucus membranes or skin necrosis: No Has patient had a PCN reaction that required hospitalization No Has patient had a PCN reaction occurring within the last 10 years: No If all of the above answers are NO, then may proceed with Cephalosporin use.  :   Family History  Problem Relation Age of Onset   Diabetes Mother    Hypertension Mother    Anemia Mother        Died of aplastic anemia   Lung disease Father    Heart attack Brother    Heart disease Sister        One sister had heart arrest  :   Social History   Socioeconomic History   Marital status: Divorced    Spouse name: Not on file   Number of children: Not on file   Years of education: Not on file   Highest education level: Not on file  Occupational History   Not on file  Tobacco Use   Smoking status: Every Day    Current packs/day: 0.00    Types: Cigarettes    Start date: 01/14/1999    Last attempt to quit: 01/13/2006    Years since quitting: 17.7   Smokeless tobacco: Never  Vaping Use   Vaping status: Never Used  Substance and Sexual Activity   Alcohol  use: Yes    Comment: occasional    Drug use: No   Sexual activity: Not on file  Other Topics Concern   Not on file  Social History Narrative   Not on file   Social Drivers of Health   Financial Resource Strain: Not on file  Food Insecurity: No Food Insecurity (10/23/2023)   Hunger Vital Sign    Worried About Running Out of Food in the Last Year: Never true     Ran Out of Food in the Last Year: Never true  Transportation Needs: No Transportation Needs (10/23/2023)   PRAPARE - Administrator, Civil Service (Medical): No    Lack of Transportation (Non-Medical): No  Physical Activity: Not on file  Stress: Not on file  Social Connections: Socially Isolated (10/23/2023)   Social Connection and Isolation Panel    Frequency of Communication with Friends and Family: Once a week    Frequency of Social Gatherings with Friends and Family: Never    Attends Religious Services: Never    Database administrator or Organizations: Yes    Attends Banker Meetings: Never    Marital Status: Divorced  Catering manager Violence: Not At Risk (10/23/2023)   Humiliation, Afraid, Rape, and Kick questionnaire    Fear of Current or Ex-Partner: No    Emotionally Abused: No    Physically Abused: No    Sexually Abused: No  :  {Ros - complete:30496}  Exam: Patient Vitals for the past 24 hrs:  BP Temp Temp src Pulse Resp SpO2  10/25/23 1622 127/77 (!) 97.5 F (36.4 C) Oral 84 12 98 %  10/25/23 1557 119/71 -- -- 84 (!) 0 100 %  10/25/23 1555 119/71 -- -- 84 17 100 %  10/25/23 1550 117/72 -- -- 82 (!) 0 100 %  10/25/23 1545 119/71 -- -- 82 17 100 %  10/25/23 1540 124/70 -- -- 82 17 100 %  10/25/23 1535 120/70 -- -- 81 18 100 %  10/25/23 1530 128/68 -- -- 85 16 100 %  10/25/23 1527 128/71 -- -- 76 18 100 %  10/25/23 1525 128/71 -- -- 77 13 100 %  10/25/23 1520 122/78 -- -- 80 18 100 %  10/25/23 1515 124/66 -- -- 77 (!) 0 100 %  10/25/23 1511 -- -- -- 80 18 100 %  10/25/23 1450 131/71 -- -- 76 14 100 %  10/25/23 1445 137/72 -- -- 74 16 100 %  10/25/23 1440 131/72 -- -- 76 17 100 %  10/25/23 1435 133/72 -- -- 74 17 100 %  10/25/23 1430 126/72 -- -- 73 17 100 %  10/25/23 1425 129/67 -- -- 74 13 100 %  10/25/23 1420 120/64 -- -- 67 17 100 %  10/25/23 1415 116/65 -- -- 67 17 100 %  10/25/23 1410 109/68 -- -- 66 18 100 %  10/25/23 1405  101/61 -- -- 64 (!) 21 100 %  10/25/23 1400 (!) 95/58 -- -- 62 19 100 %  10/25/23 1355 (!) 85/57 -- -- 62 18 100 %  10/25/23 1350 96/61 -- -- 76 (!) 23 100 %  10/25/23 1345 104/79 -- -- 85 18 100 %  10/25/23 1340 139/71 -- -- 83 14 100 %  10/25/23 1335 139/73 -- -- 93 17 100 %  10/25/23 1330 127/73 -- -- 86 15 100 %  10/25/23 1320 138/76 -- -- 81 10 100 %  10/25/23 0958 (!) 148/84 98.1 F (36.7 C) Oral 80 18 99 %  10/25/23 0457 (!) 141/82 98.3 F (36.8 C) -- 82 18 100 %  10/25/23 0106 (!) 149/92 98.2 F (36.8 C) -- 87 17 98 %  10/24/23 2223 (!) 142/78 97.6 F (36.4 C) Oral 91 18 98 %    General:  well-nourished in no acute distress.  Eyes:  no scleral icterus.  ENT:  There were no oropharyngeal lesions.  Neck was without thyromegaly.  Lymphatics:  Negative cervical, supraclavicular or axillary adenopathy.  Respiratory: lungs were clear bilaterally without wheezing or crackles.  Cardiovascular:  Regular rate and rhythm, S1/S2, without murmur, rub or gallop.  There was no pedal edema.  GI:  abdomen was soft, flat, nontender, nondistended, without organomegaly.  Muscoloskeletal:  no  spinal tenderness of palpation of vertebral spine.  Skin exam was without echymosis, petichae.  Neuro exam was nonfocal.  Patient was able to get on and off exam table without assistance.  Gait was normal.  Patient was alerted and oriented.  Attention was good.   Language was appropriate.  Mood was normal without depression.  Speech was not pressured.  Thought content was not tangential.     Lab Results  Component Value Date   WBC 6.9 10/25/2023   HGB 8.9 (L) 10/25/2023   HCT 26.1 (L) 10/25/2023   PLT 101 (L) 10/25/2023   GLUCOSE 195 (H) 10/25/2023   CHOL 135 07/23/2019   TRIG 170 (H) 07/23/2019   HDL 46 07/23/2019   LDLCALC 55 07/23/2019   ALT 211 (H) 10/25/2023   AST 245 (H) 10/25/2023   NA 136 10/25/2023   K 3.7 10/25/2023   CL 102 10/25/2023   CREATININE 1.14 10/25/2023   BUN 17 10/25/2023   CO2  23 10/25/2023    CT BONE TROCAR/NEEDLE BIOPSY DEEP Result Date: 10/25/2023 INDICATION: Multifocal sclerotic lesions throughout the axial skeleton. EXAM: CT-guided bone lesion biopsy TECHNIQUE: Multidetector CT imaging of the pelvis was performed following the standard protocol without IV contrast. RADIATION DOSE REDUCTION: This exam was performed according to the departmental dose-optimization program which includes automated exposure control, adjustment of the mA and/or kV according to patient size and/or use of iterative reconstruction technique. MEDICATIONS: None. ANESTHESIA/SEDATION: Moderate (conscious) sedation was employed during this procedure. A total of Versed  mg and Fentanyl  mcg was administered intravenously by the radiology nurse. Total intra-service moderate Sedation Time: minutes. The patient's level of consciousness and vital signs were monitored continuously by radiology nursing throughout the procedure under my direct supervision. COMPLICATIONS: None immediate. PROCEDURE: Informed written consent was obtained from the patient after a thorough discussion of the procedural risks, benefits and alternatives. All questions were addressed. Maximal Sterile Barrier Technique was utilized including caps, mask, sterile gowns, sterile gloves, sterile drape, hand hygiene and skin antiseptic. A timeout was performed prior to the initiation of the procedure. The patient was placed in a prone position on the CT gantry. Initial axial images of the pelvis were obtained. The more sclerotic region of the left iliac bone was selected as the target. Radiopaque markers were placed on patient's skin. Repeat imaging was performed. Measurements for access were applied. Patient's skin was then prepped, draped, and anesthetized in the usual sterile fashion with 1% lidocaine . 13 gauge biopsy needle was then advanced through a small incision to the left iliac bone. A total of 2 samples were then obtained once the  sclerotic region was engaged. The initial sclerotic focus could not be retrieved from the needle. A second sample was obtained which was easily retrieved and sent to pathology. Sterile dressing applied. No immediate complications. IMPRESSION: Satisfactory bone biopsy of a sclerotic region in the left iliac bone. Electronically Signed   By: Cordella Banner   On: 10/25/2023 17:02   DG C-Arm 1-60 Min Result Date: 10/25/2023 CLINICAL DATA:  Right UPJ calculus with hydronephrosis. EXAM: DG C-ARM 1-60 MIN CONTRAST:  Please see operative note for detail FLUOROSCOPY: Radiation exposure index: 27.37 mGy, air kerma COMPARISON:  CT scan of the abdomen and pelvis 10/22/2023 FINDINGS: A total of 8 intraoperative saved images are submitted for review. Initial spot imaging demonstrates a radiopacity at the L3 level consistent with the known stone. Subsequent images document cannulation of the right ureteral orifice and retrograde ureteral pyelogram. There is mild  hydronephrosis. The filling defect persists at the ureteropelvic junction. Some additional irregularity is present at the termination of the study likely representing either introduced bubbles or a small amount of thrombus formation. IMPRESSION: 1. At least partially obstructing right UPJ stone with associated mild to moderate hydronephrosis. 2. Right retrograde ureterogram. Electronically Signed   By: Wilkie Lent M.D.   On: 10/25/2023 10:58   US  SCROTUM W/DOPPLER Result Date: 10/23/2023 EXAM: ULTRASOUND SCROTUM/TESTICLES WITH DOPPLER FLOW EVALUATION 10/23/2023 02:13:05 PM TECHNIQUE: Duplex ultrasound using B-mode/gray scaled imaging, Doppler spectral analysis and color flow Doppler was obtained of the testicles. COMPARISON: None available. CLINICAL HISTORY: Abscess. FINDINGS: RIGHT: MEASUREMENTS: The right testicle measures 3.9 x 1.9 x 2.3 cm. GREY SCALE: The right testicle demonstrates normal homogeneous echotexture without focal lesion. No testicular  microlithiasis. DOPPLER EVALUATION: There is normal arterial and venous Doppler flow within the testicle. VARICOCELE: Right varicocele noted, 3.1 mm in diameter. SCROTAL SAC: No hydrocele. EPIDIDYMIS: No acute abnormality. LEFT: MEASUREMENTS: The left testicle measures 3.5 x 2.0 x 2.1 cm. GREY SCALE: The left testicle demonstrates normal homogeneous echotexture without focal lesion. No testicular microlithiasis. DOPPLER EVALUATION: There is normal arterial and venous Doppler flow within the testicle. VARICOCELE: No scrotal varicocele. SCROTAL SAC: No hydrocele. EPIDIDYMIS: 1.1 x 1.0 x 1.0 cm left epididymal cystic lesion potentially with some minimal layering complexity but no internal blood flow noted. Likely a minimally complex epididymal cyst or spermatocele. Appearance is not characteristic of epididymal abscess. IMPRESSION: 1. No evidence of  abscess. 2. Right varicocele, 3.1 mm in diameter. 3. Left epididymal cystic lesion (likely minimally complex epididymal cyst or spermatocele), 1.1 x 1.0 x 1.0 cm. 4. Normal testicular arterial and venous flow bilaterally. Electronically signed by: Ryan Salvage MD 10/23/2023 02:40 PM EDT RP Workstation: [id number]   CT Angio Chest PE W and/or Wo Contrast Result Date: 10/23/2023 EXAM: CTA of the Chest with contrast for PE 10/23/2023 12:28:06 AM TECHNIQUE: CTA of the chest was performed without and with the administration of 60 mL of intravenous contrast (iohexol  (OMNIPAQUE ) 350 MG/ML injection 75 mL IOHEXOL  350 MG/ML SOLN). Multiplanar reformatted images are provided for review. MIP images are provided for review. Automated exposure control, iterative reconstruction, and/or weight based adjustment of the mA/kV was utilized to reduce the radiation dose to as low as reasonably achievable. COMPARISON: None available. CLINICAL HISTORY: Pulmonary embolism (PE) suspected, high prob. F/u from CT abdomen/pelvis w/ suspicious findings; Abscess; 60 ml omni 350. FINDINGS:  PULMONARY ARTERIES: Pulmonary arteries are adequately opacified for evaluation. No pulmonary embolism. Main pulmonary artery is normal in caliber. MEDIASTINUM: The heart and pericardium demonstrate no acute abnormality. There is no acute abnormality of the thoracic aorta. LYMPH NODES: Left axillary nodal metastases measuring up to 2.0 cm (image 42). 13 mm short axis left subpectoral nodal metastasis (image 32). 13 mm short axis subcarinal node (image 69). LUNGS AND PLEURA: Mild interlobar septal thickening with subpleural nodularity in the lungs bilaterally, measuring up to 4 mm in the lingula (image 90). This is nonspecific but suspicious for perilymphatic tumor spread in this clinical setting. Trace bilateral pleural effusions, left greater than right. No pneumothorax. UPPER ABDOMEN: Limited images of the upper abdomen are unremarkable. SOFT TISSUES AND BONES: 8.3 x 3.8 cm left breast mass with overlying cutaneous thickening (image 81), suspicious for male breast cancer. Diffuse mixed sclerotic metastases throughout the visualized axial and appendicular skeleton. IMPRESSION: 1. No pulmonary embolism. 2. 8.3 cm left breast mass with overlying cutaneous thickening, suspicious for male breast cancer.  3. Left axillary, left subpectoral, and subcarinal nodal metastases. 4. Subpleural pulmonary nodularity, suspicious for metastases in this clinical setting. 5. Diffuse osseous metastases throughout the visualized axial and appendicular skeleton. 6. Trace bilateral pleural effusions. Electronically signed by: Pinkie Pebbles MD 10/23/2023 12:36 AM EDT RP Workstation: [id number]   CT ABDOMEN PELVIS W CONTRAST Result Date: 10/22/2023 EXAM: CT ABDOMEN AND PELVIS WITH CONTRAST 10/22/2023 11:38:08 PM TECHNIQUE: CT of the abdomen and pelvis was performed with the administration of 75 mL of intravenous contrast (iohexol  (OMNIPAQUE ) 350 MG/ML injection 75 mL IOHEXOL  350 MG/ML SOLN). Multiplanar reformatted images are  provided for review. Automated exposure control, iterative reconstruction, and/or weight-based adjustment of the mA/kV was utilized to reduce the radiation dose to as low as reasonably achievable. COMPARISON: None available. CLINICAL HISTORY: Concern for scrotal abscess, elevated LFT's. Pt complains of scrotal perineal abscess seen at urgent care sent in for further evaluation. FINDINGS: LOWER CHEST: Subpleural micronodularity in the lungs bilaterally, including a dominant 4 mm lingular nodule (image 1), indeterminate. Trace left pleural effusion. LIVER: Subcentimeter cyst in the left hepatic lobe (image 18), benign. GALLBLADDER AND BILE DUCTS: Gallbladder is unremarkable. No biliary ductal dilatation. SPLEEN: No acute abnormality. PANCREAS: No acute abnormality. ADRENAL GLANDS: No acute abnormality. KIDNEYS, URETERS AND BLADDER: Moderate right hydronephrosis with associated 12 mm proximal right ureteral calculus at the L3-L4 level. No stones in the left kidney or left ureter. No perinephric or periureteral stranding. Mildly thick-walled bladder, although underdistended. GI AND BOWEL: Stomach demonstrates no acute abnormality. Normal appendix (image 52). There is no bowel obstruction. PERITONEUM AND RETROPERITONEUM: No ascites. No free air. VASCULATURE: Aorta is normal in caliber. Atherosclerotic calcifications of the abdominal aorta and branch vessels, although patent. LYMPH NODES: No lymphadenopathy. REPRODUCTIVE ORGANS: Prostatomegaly. Mild scrotal wall thickening/edema. No discrete abscess on CT. BONES AND SOFT TISSUES: Tiny fat-containing bilateral inguinal hernias. Diffuse sclerotic metastases throughout the visualized axial and appendicular skeleton. IMPRESSION: 1. Moderate right hydronephrosis with associated 12 mm proximal right ureteral calculus at the L3-L4 level. 2. Mild scrotal wall thickening/edema without discrete abscess on CT. 3. Diffuse sclerotic metastases throughout the visualized axial and  appendicular skeleton. Correlate for history of malignancy, presumably prostate CA. 4. Subpleural micronodularity measuring up to 4 mm in the lingula. In the setting of suspected primary malignancy, Fleischner Society guidelines do not apply. Consider follow-up CT chest as clinically warranted. Electronically signed by: Pinkie Pebbles MD 10/22/2023 11:52 PM EDT RP Workstation: [id number]   DG Thoracic Spine W/Swimmers Result Date: 10/22/2023 CLINICAL DATA:  Back pain EXAM: THORACIC SPINE - 3 VIEWS COMPARISON:  None Available. FINDINGS: Questionable mottled appearance and sclerosis throughout the thoracic spine. No fracture or malalignment. Disc spaces are maintained. Anterior and lateral spurring. IMPRESSION: Questionable mottled/sclerotic appearance of the thoracic spine. Consider further evaluation with CT to exclude sclerotic lesions/metastases. Electronically Signed   By: Franky Crease M.D.   On: 10/22/2023 01:03   DG Lumbar Spine Complete Result Date: 10/22/2023 CLINICAL DATA:  Back pain EXAM: LUMBAR SPINE - COMPLETE 4+ VIEW COMPARISON:  None Available. FINDINGS: Degenerative facet disease, most pronounced at L4-5 and L5-S1. 4 mm anterolisthesis of L4 on L5. Disc spaces are maintained. No fracture. IMPRESSION: Degenerative facet disease in the lower lumbar spine. No acute bony abnormality. Electronically Signed   By: Franky Crease M.D.   On: 10/22/2023 00:58    Pathology:***  CT BONE TROCAR/NEEDLE BIOPSY DEEP Result Date: 10/25/2023 INDICATION: Multifocal sclerotic lesions throughout the axial skeleton. EXAM: CT-guided bone lesion biopsy TECHNIQUE: Multidetector  CT imaging of the pelvis was performed following the standard protocol without IV contrast. RADIATION DOSE REDUCTION: This exam was performed according to the departmental dose-optimization program which includes automated exposure control, adjustment of the mA and/or kV according to patient size and/or use of iterative reconstruction  technique. MEDICATIONS: None. ANESTHESIA/SEDATION: Moderate (conscious) sedation was employed during this procedure. A total of Versed  mg and Fentanyl  mcg was administered intravenously by the radiology nurse. Total intra-service moderate Sedation Time: minutes. The patient's level of consciousness and vital signs were monitored continuously by radiology nursing throughout the procedure under my direct supervision. COMPLICATIONS: None immediate. PROCEDURE: Informed written consent was obtained from the patient after a thorough discussion of the procedural risks, benefits and alternatives. All questions were addressed. Maximal Sterile Barrier Technique was utilized including caps, mask, sterile gowns, sterile gloves, sterile drape, hand hygiene and skin antiseptic. A timeout was performed prior to the initiation of the procedure. The patient was placed in a prone position on the CT gantry. Initial axial images of the pelvis were obtained. The more sclerotic region of the left iliac bone was selected as the target. Radiopaque markers were placed on patient's skin. Repeat imaging was performed. Measurements for access were applied. Patient's skin was then prepped, draped, and anesthetized in the usual sterile fashion with 1% lidocaine . 13 gauge biopsy needle was then advanced through a small incision to the left iliac bone. A total of 2 samples were then obtained once the sclerotic region was engaged. The initial sclerotic focus could not be retrieved from the needle. A second sample was obtained which was easily retrieved and sent to pathology. Sterile dressing applied. No immediate complications. IMPRESSION: Satisfactory bone biopsy of a sclerotic region in the left iliac bone. Electronically Signed   By: Cordella Banner   On: 10/25/2023 17:02   DG C-Arm 1-60 Min Result Date: 10/25/2023 CLINICAL DATA:  Right UPJ calculus with hydronephrosis. EXAM: DG C-ARM 1-60 MIN CONTRAST:  Please see operative note for detail  FLUOROSCOPY: Radiation exposure index: 27.37 mGy, air kerma COMPARISON:  CT scan of the abdomen and pelvis 10/22/2023 FINDINGS: A total of 8 intraoperative saved images are submitted for review. Initial spot imaging demonstrates a radiopacity at the L3 level consistent with the known stone. Subsequent images document cannulation of the right ureteral orifice and retrograde ureteral pyelogram. There is mild hydronephrosis. The filling defect persists at the ureteropelvic junction. Some additional irregularity is present at the termination of the study likely representing either introduced bubbles or a small amount of thrombus formation. IMPRESSION: 1. At least partially obstructing right UPJ stone with associated mild to moderate hydronephrosis. 2. Right retrograde ureterogram. Electronically Signed   By: Wilkie Lent M.D.   On: 10/25/2023 10:58   US  SCROTUM W/DOPPLER Result Date: 10/23/2023 EXAM: ULTRASOUND SCROTUM/TESTICLES WITH DOPPLER FLOW EVALUATION 10/23/2023 02:13:05 PM TECHNIQUE: Duplex ultrasound using B-mode/gray scaled imaging, Doppler spectral analysis and color flow Doppler was obtained of the testicles. COMPARISON: None available. CLINICAL HISTORY: Abscess. FINDINGS: RIGHT: MEASUREMENTS: The right testicle measures 3.9 x 1.9 x 2.3 cm. GREY SCALE: The right testicle demonstrates normal homogeneous echotexture without focal lesion. No testicular microlithiasis. DOPPLER EVALUATION: There is normal arterial and venous Doppler flow within the testicle. VARICOCELE: Right varicocele noted, 3.1 mm in diameter. SCROTAL SAC: No hydrocele. EPIDIDYMIS: No acute abnormality. LEFT: MEASUREMENTS: The left testicle measures 3.5 x 2.0 x 2.1 cm. GREY SCALE: The left testicle demonstrates normal homogeneous echotexture without focal lesion. No testicular microlithiasis. DOPPLER EVALUATION: There is  normal arterial and venous Doppler flow within the testicle. VARICOCELE: No scrotal varicocele. SCROTAL SAC: No  hydrocele. EPIDIDYMIS: 1.1 x 1.0 x 1.0 cm left epididymal cystic lesion potentially with some minimal layering complexity but no internal blood flow noted. Likely a minimally complex epididymal cyst or spermatocele. Appearance is not characteristic of epididymal abscess. IMPRESSION: 1. No evidence of  abscess. 2. Right varicocele, 3.1 mm in diameter. 3. Left epididymal cystic lesion (likely minimally complex epididymal cyst or spermatocele), 1.1 x 1.0 x 1.0 cm. 4. Normal testicular arterial and venous flow bilaterally. Electronically signed by: Ryan Salvage MD 10/23/2023 02:40 PM EDT RP Workstation: [id number]   CT Angio Chest PE W and/or Wo Contrast Result Date: 10/23/2023 EXAM: CTA of the Chest with contrast for PE 10/23/2023 12:28:06 AM TECHNIQUE: CTA of the chest was performed without and with the administration of 60 mL of intravenous contrast (iohexol  (OMNIPAQUE ) 350 MG/ML injection 75 mL IOHEXOL  350 MG/ML SOLN). Multiplanar reformatted images are provided for review. MIP images are provided for review. Automated exposure control, iterative reconstruction, and/or weight based adjustment of the mA/kV was utilized to reduce the radiation dose to as low as reasonably achievable. COMPARISON: None available. CLINICAL HISTORY: Pulmonary embolism (PE) suspected, high prob. F/u from CT abdomen/pelvis w/ suspicious findings; Abscess; 60 ml omni 350. FINDINGS: PULMONARY ARTERIES: Pulmonary arteries are adequately opacified for evaluation. No pulmonary embolism. Main pulmonary artery is normal in caliber. MEDIASTINUM: The heart and pericardium demonstrate no acute abnormality. There is no acute abnormality of the thoracic aorta. LYMPH NODES: Left axillary nodal metastases measuring up to 2.0 cm (image 42). 13 mm short axis left subpectoral nodal metastasis (image 32). 13 mm short axis subcarinal node (image 69). LUNGS AND PLEURA: Mild interlobar septal thickening with subpleural nodularity in the lungs  bilaterally, measuring up to 4 mm in the lingula (image 90). This is nonspecific but suspicious for perilymphatic tumor spread in this clinical setting. Trace bilateral pleural effusions, left greater than right. No pneumothorax. UPPER ABDOMEN: Limited images of the upper abdomen are unremarkable. SOFT TISSUES AND BONES: 8.3 x 3.8 cm left breast mass with overlying cutaneous thickening (image 81), suspicious for male breast cancer. Diffuse mixed sclerotic metastases throughout the visualized axial and appendicular skeleton. IMPRESSION: 1. No pulmonary embolism. 2. 8.3 cm left breast mass with overlying cutaneous thickening, suspicious for male breast cancer. 3. Left axillary, left subpectoral, and subcarinal nodal metastases. 4. Subpleural pulmonary nodularity, suspicious for metastases in this clinical setting. 5. Diffuse osseous metastases throughout the visualized axial and appendicular skeleton. 6. Trace bilateral pleural effusions. Electronically signed by: Pinkie Pebbles MD 10/23/2023 12:36 AM EDT RP Workstation: [id number]   CT ABDOMEN PELVIS W CONTRAST Result Date: 10/22/2023 EXAM: CT ABDOMEN AND PELVIS WITH CONTRAST 10/22/2023 11:38:08 PM TECHNIQUE: CT of the abdomen and pelvis was performed with the administration of 75 mL of intravenous contrast (iohexol  (OMNIPAQUE ) 350 MG/ML injection 75 mL IOHEXOL  350 MG/ML SOLN). Multiplanar reformatted images are provided for review. Automated exposure control, iterative reconstruction, and/or weight-based adjustment of the mA/kV was utilized to reduce the radiation dose to as low as reasonably achievable. COMPARISON: None available. CLINICAL HISTORY: Concern for scrotal abscess, elevated LFT's. Pt complains of scrotal perineal abscess seen at urgent care sent in for further evaluation. FINDINGS: LOWER CHEST: Subpleural micronodularity in the lungs bilaterally, including a dominant 4 mm lingular nodule (image 1), indeterminate. Trace left pleural effusion.  LIVER: Subcentimeter cyst in the left hepatic lobe (image 18), benign. GALLBLADDER AND BILE DUCTS: Gallbladder is  unremarkable. No biliary ductal dilatation. SPLEEN: No acute abnormality. PANCREAS: No acute abnormality. ADRENAL GLANDS: No acute abnormality. KIDNEYS, URETERS AND BLADDER: Moderate right hydronephrosis with associated 12 mm proximal right ureteral calculus at the L3-L4 level. No stones in the left kidney or left ureter. No perinephric or periureteral stranding. Mildly thick-walled bladder, although underdistended. GI AND BOWEL: Stomach demonstrates no acute abnormality. Normal appendix (image 52). There is no bowel obstruction. PERITONEUM AND RETROPERITONEUM: No ascites. No free air. VASCULATURE: Aorta is normal in caliber. Atherosclerotic calcifications of the abdominal aorta and branch vessels, although patent. LYMPH NODES: No lymphadenopathy. REPRODUCTIVE ORGANS: Prostatomegaly. Mild scrotal wall thickening/edema. No discrete abscess on CT. BONES AND SOFT TISSUES: Tiny fat-containing bilateral inguinal hernias. Diffuse sclerotic metastases throughout the visualized axial and appendicular skeleton. IMPRESSION: 1. Moderate right hydronephrosis with associated 12 mm proximal right ureteral calculus at the L3-L4 level. 2. Mild scrotal wall thickening/edema without discrete abscess on CT. 3. Diffuse sclerotic metastases throughout the visualized axial and appendicular skeleton. Correlate for history of malignancy, presumably prostate CA. 4. Subpleural micronodularity measuring up to 4 mm in the lingula. In the setting of suspected primary malignancy, Fleischner Society guidelines do not apply. Consider follow-up CT chest as clinically warranted. Electronically signed by: Pinkie Pebbles MD 10/22/2023 11:52 PM EDT RP Workstation: [id number]   DG Thoracic Spine W/Swimmers Result Date: 10/22/2023 CLINICAL DATA:  Back pain EXAM: THORACIC SPINE - 3 VIEWS COMPARISON:  None Available. FINDINGS:  Questionable mottled appearance and sclerosis throughout the thoracic spine. No fracture or malalignment. Disc spaces are maintained. Anterior and lateral spurring. IMPRESSION: Questionable mottled/sclerotic appearance of the thoracic spine. Consider further evaluation with CT to exclude sclerotic lesions/metastases. Electronically Signed   By: Franky Crease M.D.   On: 10/22/2023 01:03   DG Lumbar Spine Complete Result Date: 10/22/2023 CLINICAL DATA:  Back pain EXAM: LUMBAR SPINE - COMPLETE 4+ VIEW COMPARISON:  None Available. FINDINGS: Degenerative facet disease, most pronounced at L4-5 and L5-S1. 4 mm anterolisthesis of L4 on L5. Disc spaces are maintained. No fracture. IMPRESSION: Degenerative facet disease in the lower lumbar spine. No acute bony abnormality. Electronically Signed   By: Franky Crease M.D.   On: 10/22/2023 00:58    Assessment and Plan: ***     The length of time of the face-to-face encounter was *** minutes. More than 50% of time was spent counseling and coordination of care.     Thank you for this referral.

## 2023-10-25 NOTE — Plan of Care (Signed)

## 2023-10-25 NOTE — Assessment & Plan Note (Addendum)
 Multiple metastases with unknown origin.  Potentially breast versus prostate versus multiple myeloma. Unable to do breast US  inpatient. PSA 4.31.  - Oncology consulted, appreciate recommendations  - Dr. Loretha will be seeing patient today - Pain management: Scheduled IV Tylenol  1000 mg Q6H, morphine  2 mg Q4H PRN - Scheduled bowel regimen with MiraLAX and senna once daily - RD consult, appreciate recommendations - Labs:  - Kappa Free light chain 70.9, lambda free light chain 61.6, alpha-1 globin 0.5, gamma globin 1.9, total globin 4.1, RPR nonreactive, GC chlamydia negative, HIV nonreactive

## 2023-10-25 NOTE — Progress Notes (Addendum)
 Daily Progress Note Intern Pager: [phone]  Patient name: Larry Houston Medical record number: [id number] Date of birth: [birth year] Age: 68 y.o. Gender: male  Primary Care Provider: Larraine Palma, MD Consultants: Urology, Oncology  Code Status: DNR/DNI  [name], [name], chronic alcohol  use, and chronic back pain who presented with scrotal pain and drainage. Patient with unintentional weight loss, back pain, fecal and urinary incontenence found to have diffuse metastatic cancer. Oncology consulted, bone biopsy by IR ordered. Patient also with a 12 mm ureteral stone. Urology consulted, I&D of scrotal abscess yesterday, but unable to place stent around stone. Placed IR order for nephrotomy tube.   Assessment & Plan Scrotal abscess Afebrile.  S/p 1 day I&D by Urology.  - Antibiotics: IV Vancomycin  750 mg Q12h, IV cefepime 2 g Q8h, and add oral metronidazole  500 mg Q12h - Urology consulted, appreciate recommendations - Blood cultures no growth 3 days -Nausea: Reglan 5 mg every 8 hours as needed - AM CBC, BMP Metastatic disease (HCC) Multiple metastases with unknown origin.  Potentially breast versus prostate versus multiple myeloma. Unable to do breast US  inpatient. PSA 4.31.  - Oncology consulted, appreciate recommendations  - Dr. Loretha will be seeing patient today - Pain management: Scheduled IV Tylenol  1000 mg Q6H, morphine  2 mg Q4H PRN - Scheduled bowel regimen with MiraLAX and senna once daily - RD consult, appreciate recommendations - Labs:  - Kappa Free light chain 70.9, lambda free light chain 61.6, alpha-1 globin 0.5, gamma globin 1.9, total globin 4.1, RPR nonreactive, GC chlamydia negative, HIV nonreactive Acute anemia Hemoglobin 8.9, s/p 1 unit PRBCs 10/15.  - Will monitor  - AM CBC Hydronephrosis of right  kidney Nephrolithiasis CTAP with R moderate hydronephrosis and 12 mm proximal right ureteral calculus at the level of L3-L4.  - Urology consulted as above, appreciate recommendations  - Unable to place stent   - Ordered Nephrostomy tube placement by IR  Alcohol  use (HCC) Transaminitis LFTs downtrending.  AST/ALT elevated likely secondary to alcohol  use.  Hepatitis panel nonreactive.  No known history of alcohol  withdrawal or seizures. -CIWA protocol -Nutritional supplements including thiamine  - AM CMP Hidradenitis suppurativa of multiple sites Patient with several weeks of indurated, tunneled, draining axillary lesions concerning for HS.  Potentially lymphadenopathy from metastatic cancer. - Monitor symptom progression with IV antibiotic management Chronic health problem HTN: amlodipine  5 mg every day  History of CVA: aspirin  81 mg every day, lipitor  80 mg every day  T2DM: last A1c on 09/26/23 7.1     FEN/GI: NPO pending bone biopsy  PPx: Held pending bone biopsy  Dispo: Pending clinical improvement.   Subjective:  Patient seen lying in bed this morning.  He states I do not even know how I feel anymore.  He does endorse abdominal pain this morning, however, he is unsure if it is just hunger pains or something more.  He rates his pain as an 8/10.  His scrotum is sore this morning.    Objective: Temp:  [97.6 F (36.4 C)-98.4 F (36.9 C)] 98.3 F (36.8 C) (10/17 0457) Pulse Rate:  [80-91] 82 (10/17 0457) Resp:  [12-18] 18 (10/17 0457) BP: (114-164)/(68-92) 141/82 (10/17 0457) SpO2:  [95 %-100 %] 100 % (10/17 0457) Weight:  [79.8 kg] 79.8 kg (10/16 1343) Physical Exam: General: NAD Cardiovascular: RRR, no  M/R/G Respiratory: CTAB, normal work of breathing on room air Abdomen: Thin, soft, nondistended, nontender to palpation Extremities: SCDs in place  Laboratory: Most recent CBC Lab Results  Component Value Date   WBC 6.9 10/25/2023   HGB 8.9 (L) 10/25/2023   HCT 26.1  (L) 10/25/2023   MCV 90.0 10/25/2023   PLT 101 (L) 10/25/2023   Most recent BMP    Latest Ref Rng & Units 10/25/2023    3:11 AM  BMP  Glucose 70 - 99 mg/dL 804   BUN 8 - 23 mg/dL 17   Creatinine 9.38 - 1.24 mg/dL 8.85   Sodium 864 - 854 mmol/L 136   Potassium 3.5 - 5.1 mmol/L 3.7   Chloride 98 - 111 mmol/L 102   CO2 22 - 32 mmol/L 23   Calcium  8.9 - 10.3 mg/dL 8.6    Other pertinent labs: Kappa Free light chain 70.9, lambda free light chain 61.6, alpha-1 globin 0.5, gamma globin 1.9, total globin 4.1, RPR nonreactive, GC chlamydia negative, HIV nonreactive  Imaging/Diagnostic Tests: No new imaging.   Larry Raguel MATSU, DO 10/25/2023, 7:56 AM  PGY-1, [facility] FPTS Intern pager: [phone], text pages welcome Secure chat group [facility] Teaching Service

## 2023-10-25 NOTE — Procedures (Signed)
 Interventional Radiology Procedure Note  Procedure: Left iliac bone lesion biopsy CT Guided and right PCN placement  Complications: None  Estimated Blood Loss: < 10 mL  Findings: 13 G core biopsy of sclerotic bone left iliac performed under CT guidance.  1 core samples obtained and sent to Pathology.  10Fr PCN placed right kidney.  Cordella DELENA Banner, MD

## 2023-10-25 NOTE — Assessment & Plan Note (Signed)
 Patient with several weeks of indurated, tunneled, draining axillary lesions concerning for HS.  Potentially lymphadenopathy from metastatic cancer. - Monitor symptom progression with IV antibiotic management

## 2023-10-25 NOTE — Progress Notes (Signed)
   1 Day Post-Op Subjective: Pt resting comfortably. Intermittent pain. Patient and partner updated at bedside.   Objective: Vital signs in last 24 hours: Temp:  [97.6 F (36.4 C)-98.4 F (36.9 C)] 98.3 F (36.8 C) (10/17 0457) Pulse Rate:  [80-91] 82 (10/17 0457) Resp:  [12-18] 18 (10/17 0457) BP: (114-164)/(68-92) 141/82 (10/17 0457) SpO2:  [95 %-100 %] 100 % (10/17 0457) Weight:  [79.8 kg] 79.8 kg (10/16 1343)  Assessment/Plan:  #right proximal ureteral calculus  Unable to advance stent in OR 10/16. IR has been consulted for right PCNT.   Potentially transition to antegrade stent in the near future.   Definitive stone mgmt on an outpt basis.  #scrotal abscess  Wound packing done at bedside today. Pt partner observed and will handle at discharge. Iodiform 1/4 changed BID or whenever soiled.   Intake/Output from previous day: 10/16 0701 - 10/17 0700 In: 1987.1 [I.V.:500; IV Piggyback:1487.1] Out: 1505 [Urine:1500; Blood:5]  Intake/Output this shift: No intake/output data recorded.  Physical Exam:  General: Alert and oriented CV: No cyanosis Lungs: equal chest rise Abdomen: Soft, NTND, no rebound or guarding Gu: posterior wound at base of scrotum  Lab Results: Recent Labs    10/23/23 2016 10/24/23 0750 10/25/23 0311  HGB 9.1* 9.3* 8.9*  HCT 27.2* 27.7* 26.1*   BMET Recent Labs    10/24/23 0750 10/24/23 1157 10/25/23 0311  NA 138 138 136  K 3.9 3.6 3.7  CL 101 103 102  CO2 25 24 23   GLUCOSE 112* 107* 195*  BUN 10 10 17   CREATININE 1.02 0.98 1.14  CALCIUM  9.1 8.8* 8.6*  HGB 9.3*  --  8.9*  WBC 6.5  --  6.9     Studies/Results: US  SCROTUM W/DOPPLER Result Date: 10/23/2023 EXAM: ULTRASOUND SCROTUM/TESTICLES WITH DOPPLER FLOW EVALUATION 10/23/2023 02:13:05 PM TECHNIQUE: Duplex ultrasound using B-mode/gray scaled imaging, Doppler spectral analysis and color flow Doppler was obtained of the testicles. COMPARISON: None available. CLINICAL HISTORY:  Abscess. FINDINGS: RIGHT: MEASUREMENTS: The right testicle measures 3.9 x 1.9 x 2.3 cm. GREY SCALE: The right testicle demonstrates normal homogeneous echotexture without focal lesion. No testicular microlithiasis. DOPPLER EVALUATION: There is normal arterial and venous Doppler flow within the testicle. VARICOCELE: Right varicocele noted, 3.1 mm in diameter. SCROTAL SAC: No hydrocele. EPIDIDYMIS: No acute abnormality. LEFT: MEASUREMENTS: The left testicle measures 3.5 x 2.0 x 2.1 cm. GREY SCALE: The left testicle demonstrates normal homogeneous echotexture without focal lesion. No testicular microlithiasis. DOPPLER EVALUATION: There is normal arterial and venous Doppler flow within the testicle. VARICOCELE: No scrotal varicocele. SCROTAL SAC: No hydrocele. EPIDIDYMIS: 1.1 x 1.0 x 1.0 cm left epididymal cystic lesion potentially with some minimal layering complexity but no internal blood flow noted. Likely a minimally complex epididymal cyst or spermatocele. Appearance is not characteristic of epididymal abscess. IMPRESSION: 1. No evidence of  abscess. 2. Right varicocele, 3.1 mm in diameter. 3. Left epididymal cystic lesion (likely minimally complex epididymal cyst or spermatocele), 1.1 x 1.0 x 1.0 cm. 4. Normal testicular arterial and venous flow bilaterally. Electronically signed by: Ryan Salvage MD 10/23/2023 02:40 PM EDT RP Workstation: [id number]      LOS: 2 days   Ole Bourdon, NP [facility] Pager: [phone]  10/25/2023, 9:20 AM

## 2023-10-25 NOTE — Plan of Care (Signed)
 FMTS Interim Progress Note  Post-procedure from Bone Biopsy and R Percutaneous Nephrostomy Placement  S:  Pt was feeling well and was eating a regular diet comfortably without acute distress. Denies Fever, SOB, Chest pain, N/V/D, any pain, or any other concerns. States that he feels well enough to go home, but would like to stay overnight.  O: BP 127/77 (BP Location: Left Arm)   Pulse 84   Temp (!) 97.5 F (36.4 C) (Oral)   Resp 12   Ht 6' (1.829 m)   Wt 79.8 kg   SpO2 98%   BMI 23.87 kg/m    A/P: Metastatic Disease Hydronephrosis of the Left Kidney - 1 core bone sample obtained and sent to Pathology - 10Fr PCN placed in the R kidney.  Lera Nancyann NOVAK, DO 10/25/2023, 4:55 PM PGY-1, [facility] Family Medicine Service pager [phone]

## 2023-10-25 NOTE — Progress Notes (Signed)
 Patient returned back from procedure to 6N07 with a 0/10 pain, denies any nausea. Patient requesting diet orders so he can eat, MD notified, MD verbalized a diet order will be placed. All needs met at this time, bed in lowest position and call light within reach.

## 2023-10-26 DIAGNOSIS — N492 Inflammatory disorders of scrotum: Secondary | ICD-10-CM | POA: Diagnosis not present

## 2023-10-26 LAB — GLUCOSE, CAPILLARY
Glucose-Capillary: 112 mg/dL — ABNORMAL HIGH (ref 70–99)
Glucose-Capillary: 114 mg/dL — ABNORMAL HIGH (ref 70–99)
Glucose-Capillary: 107 mg/dL — ABNORMAL HIGH (ref 70–99)
Glucose-Capillary: 119 mg/dL — ABNORMAL HIGH (ref 70–99)

## 2023-10-26 LAB — BASIC METABOLIC PANEL WITH GFR
Anion gap: 11 (ref 5–15)
BUN: 25 mg/dL — ABNORMAL HIGH (ref 8–23)
CO2: 23 mmol/L (ref 22–32)
Calcium: 8.7 mg/dL — ABNORMAL LOW (ref 8.9–10.3)
Chloride: 104 mmol/L (ref 98–111)
Creatinine, Ser: 1.33 mg/dL — ABNORMAL HIGH (ref 0.61–1.24)
GFR, Estimated: 59 mL/min — ABNORMAL LOW (ref >60)
Glucose, Bld: 111 mg/dL — ABNORMAL HIGH (ref 70–99)
Potassium: 3.6 mmol/L (ref 3.5–5.1)
Sodium: 138 mmol/L (ref 135–145)

## 2023-10-26 LAB — CBC
HCT: 24.4 % — ABNORMAL LOW (ref 39.0–52.0)
HCT: 26.2 % — ABNORMAL LOW (ref 39.0–52.0)
Hemoglobin: 8 g/dL — ABNORMAL LOW (ref 13.0–17.0)
Hemoglobin: 8.5 g/dL — ABNORMAL LOW (ref 13.0–17.0)
MCH: 29.9 pg (ref 26.0–34.0)
MCH: 30.4 pg (ref 26.0–34.0)
MCHC: 32.8 g/dL (ref 30.0–36.0)
MCHC: 32.4 g/dL (ref 30.0–36.0)
MCV: 91 fL (ref 80.0–100.0)
MCV: 93.6 fL (ref 80.0–100.0)
Platelets: 106 K/uL — ABNORMAL LOW (ref 150–400)
Platelets: 109 K/uL — ABNORMAL LOW (ref 150–400)
RBC: 2.68 MIL/uL — ABNORMAL LOW (ref 4.22–5.81)
RBC: 2.8 MIL/uL — ABNORMAL LOW (ref 4.22–5.81)
RDW: 20.6 % — ABNORMAL HIGH (ref 11.5–15.5)
RDW: 20.6 % — ABNORMAL HIGH (ref 11.5–15.5)
WBC: 7 K/uL (ref 4.0–10.5)
WBC: 6.8 K/uL (ref 4.0–10.5)
nRBC: 3.3 % — ABNORMAL HIGH (ref 0.0–0.2)
nRBC: 3.8 % — ABNORMAL HIGH (ref 0.0–0.2)

## 2023-10-26 LAB — IRON AND TIBC
Iron: 162 ug/dL (ref 45–182)
Saturation Ratios: 88 % — ABNORMAL HIGH (ref 17.9–39.5)
TIBC: 183 ug/dL — ABNORMAL LOW (ref 250–450)
UIBC: 21 ug/dL

## 2023-10-26 MED ORDER — SODIUM CHLORIDE 0.9% FLUSH
5.0000 mL | Freq: Three times a day (TID) | INTRAVENOUS | Status: DC
  Administered 2023-10-26 – 2023-10-27 (×5): 5 mL

## 2023-10-26 MED ORDER — ONDANSETRON HCL 4 MG/2ML IJ SOLN
4.0000 mg | Freq: Four times a day (QID) | INTRAMUSCULAR | Status: DC | PRN
  Administered 2023-10-26: 4 mg via INTRAVENOUS
  Filled 2023-10-26: qty 2

## 2023-10-26 MED ORDER — MORPHINE SULFATE (PF) 2 MG/ML IV SOLN
2.0000 mg | INTRAVENOUS | Status: DC
Start: 2023-10-26 — End: 2023-10-28
  Administered 2023-10-26: 2 mg via INTRAVENOUS
  Filled 2023-10-26: qty 1

## 2023-10-26 MED ORDER — ACETAMINOPHEN 325 MG PO TABS
650.0000 mg | ORAL_TABLET | Freq: Four times a day (QID) | ORAL | Status: DC | PRN
  Administered 2023-10-27: 650 mg via ORAL
  Filled 2023-10-26: qty 2

## 2023-10-26 MED ORDER — METOCLOPRAMIDE HCL 5 MG/ML IJ SOLN
5.0000 mg | Freq: Once | INTRAMUSCULAR | Status: AC
  Administered 2023-10-26: 5 mg via INTRAVENOUS
  Filled 2023-10-26: qty 2

## 2023-10-26 MED ORDER — ONDANSETRON HCL 4 MG/2ML IJ SOLN
INTRAMUSCULAR | Status: AC
  Administered 2023-10-26: 4 mg via INTRAVENOUS
  Filled 2023-10-26: qty 2

## 2023-10-26 MED ORDER — PROCHLORPERAZINE EDISYLATE 10 MG/2ML IJ SOLN
10.0000 mg | Freq: Once | INTRAMUSCULAR | Status: AC
  Administered 2023-10-26: 10 mg via INTRAVENOUS
  Filled 2023-10-26: qty 2

## 2023-10-26 NOTE — Progress Notes (Addendum)
 Daily Progress Note Intern Pager: [phone]  Patient name: Larry Houston Medical record number: [id number] Date of birth: Apr 29, 1955 Age: 68 y.o. Gender: male  Primary Care Provider: Larraine Palma, MD Consultants: Urology, Oncology  Code Status: DNR-Limited   [name], 1 unit PRBCs  10/16: Scrotal I&D 10/17: Bone biopsy and nephrostomy tube placed   Medical Decision Making:  Larry Houston is a 68 y.o. male with a PMH of HTN, chronic EtOH use, and chronic back pain who presented for a scrotal abscess, now s/p I&D. Found to have metastses on imaging, Oncology has seen patient and is setting him up for outpatient follow-up. S/p bone biopsy. Assessment & Plan Scrotal abscess Afebrile, hemodynamically stable.  S/p 2 day I&D by Urology.  - Antibiotics: Continue IV Ciprofloxacin  400 mg Q12H and IV metronidazole  500 mg Q12H - Urology consulted, appreciate recommendations - Blood cultures no growth 3 days -Nausea: Zofran  ODT 4 mg Q8H PRN - AM CBC, BMP Metastatic disease (HCC) Multiple metastases with unknown origin.  Potentially breast versus prostate versus multiple myeloma. Unable to do breast US  inpatient. PSA 4.31.  - Oncology consulted, appreciate recommendations  - Dr. Loretha recommends outpatient follow-up, helping patient set up  - Pain management: Tylenol  650 mg Q6H as needed, oxycodone  5 mg Q4H PRN - Scheduled bowel regimen with MiraLAX and senna once daily - RD consult, appreciate recommendations Acute anemia Hemoglobin down trending 8.0 today.  - PM CBC - AM CBC Hydronephrosis of right kidney Nephrolithiasis CTAP with R moderate hydronephrosis and 12 mm proximal right ureteral calculus at the level of L3-L4.  - Urology consulted as above, appreciate recommendations - Nephrostomy tube placed, draining well  Alcohol  use (HCC) Transaminitis LFTs downtrending.  AST/ALT elevated likely secondary to alcohol  use.   Hepatitis panel nonreactive.  No known history of alcohol  withdrawal or seizures. -CIWA protocol Discontinued  -Nutritional supplements including thiamine  - AM CMP Hidradenitis suppurativa of multiple sites Patient with several weeks of indurated, tunneled, draining axillary lesions concerning for HS.  Potentially lymphadenopathy from metastatic cancer. - Monitor symptom progression with IV antibiotic management Chronic health problem HTN: amlodipine  5 mg every day  History of CVA: aspirin  81 mg every day, lipitor  80 mg every day  T2DM: last A1c on 09/26/23 7.1     FEN/GI: Regular  PPx: Lovenox  Dispo: Home pending clinical improvement. Barriers include nephrostomy tube.   Subjective:  Patient seen lying in bed this morning.  States he is feeling alright.  He is continuing to have abdominal pain and he rates it as a 6/10.  He states that he is sore around the site of his nephrostomy tube.  He is endorsing a little bit of nausea this morning, but states it is improved from last night.  He states that he vomited 3 times last night.  No blood in the vomit.  Objective: Temp:  [97.4 F (36.3 C)-98.7 F (37.1 C)] 98.7 F (37.1 C) (10/18 0532) Pulse Rate:  [62-93] 78 (10/18 0532) Resp:  [0-23] 14 (10/18 0532) BP: (85-148)/(57-84) 138/73 (10/18 0532) SpO2:  [98 %-100 %] 99 % (10/18 0532) Physical Exam: General: NAD Cardiovascular: RRR, no M/R/G Respiratory: CTAB, normal work of breathing on room air Abdomen: Soft, nondistended, tender to palpation in the RLQ. Nephrostomy tube in place, draining well.  Extremities: Warm, dry, no edema  Laboratory: Most recent CBC Lab Results  Component Value Date   WBC 7.0 10/26/2023   HGB  8.0 (L) 10/26/2023   HCT 24.4 (L) 10/26/2023   MCV 91.0 10/26/2023   PLT 106 (L) 10/26/2023   Most recent BMP    Latest Ref Rng & Units 10/26/2023    5:58 AM  BMP  Glucose 70 - 99 mg/dL 888   BUN 8 - 23 mg/dL 25   Creatinine 9.38 - 1.24 mg/dL 8.66    Sodium 864 - 854 mmol/L 138   Potassium 3.5 - 5.1 mmol/L 3.6   Chloride 98 - 111 mmol/L 104   CO2 22 - 32 mmol/L 23   Calcium  8.9 - 10.3 mg/dL 8.7    Imaging/Diagnostic Tests: No new imaging.  Larry Raguel MATSU, DO 10/26/2023, 8:21 AM  PGY-1, [facility] FPTS Intern pager: [phone], text pages welcome Secure chat group [facility] Teaching Service

## 2023-10-26 NOTE — Assessment & Plan Note (Addendum)
 Hemoglobin down trending 8.0 today.  - PM CBC - AM CBC

## 2023-10-26 NOTE — Progress Notes (Signed)
 Notified MD on N/V, see MAR. About an 1.5 hrs after administering pt was still having N/V. Made MD aware again, see MAR.

## 2023-10-26 NOTE — Plan of Care (Signed)

## 2023-10-26 NOTE — Assessment & Plan Note (Signed)
 HTN: amlodipine  5 mg every day  History of CVA: aspirin  81 mg every day, lipitor  80 mg every day  T2DM: last A1c on 09/26/23 7.1

## 2023-10-26 NOTE — Assessment & Plan Note (Signed)
 Patient with several weeks of indurated, tunneled, draining axillary lesions concerning for HS.  Potentially lymphadenopathy from metastatic cancer. - Monitor symptom progression with IV antibiotic management

## 2023-10-26 NOTE — Assessment & Plan Note (Signed)
 LFTs downtrending.  AST/ALT elevated likely secondary to alcohol  use.  Hepatitis panel nonreactive.  No known history of alcohol  withdrawal or seizures. -CIWA protocol Discontinued  -Nutritional supplements including thiamine  - AM CMP

## 2023-10-26 NOTE — Progress Notes (Signed)
 Nephrostomy bag was emptied and had 300 ml.

## 2023-10-26 NOTE — Assessment & Plan Note (Addendum)
 Afebrile, hemodynamically stable.  S/p 2 day I&D by Urology.  - Antibiotics: Continue IV Ciprofloxacin  400 mg Q12H and IV metronidazole  500 mg Q12H - Urology consulted, appreciate recommendations - Blood cultures no growth 3 days -Nausea: Zofran  ODT 4 mg Q8H PRN - AM CBC, BMP

## 2023-10-26 NOTE — Assessment & Plan Note (Signed)
 CTAP with R moderate hydronephrosis and 12 mm proximal right ureteral calculus at the level of L3-L4.  - Urology consulted as above, appreciate recommendations - Nephrostomy tube placed, draining well

## 2023-10-26 NOTE — Consult Note (Signed)
 Urology Inpatient Progress Report   Intv/Subj: He has been experiencing intermittent nausea/emesis.    Principal Problem:   Scrotal abscess Active Problems:   Alcohol  use (HCC)   Acute anemia   Metastatic disease (HCC)   Chronic health problem   Hidradenitis suppurativa of multiple sites   Transaminitis   Mass of left breast   Nephrolithiasis   Hydronephrosis of right kidney   Ureteral calculus, right   Protein-calorie malnutrition, severe  Current Facility-Administered Medications  Medication Dose Route Frequency Provider Last Rate Last Admin   acetaminophen  (TYLENOL ) tablet 650 mg  650 mg Oral Q6H PRN Baker, Amelia G, DO       aspirin  EC tablet 81 mg  81 mg Oral Daily Marten Peat M, RPH   81 mg at 10/26/23 0844   atorvastatin  (LIPITOR ) tablet 80 mg  80 mg Oral Daily Stoneking, Bradley J., MD   80 mg at 10/26/23 0844   ciprofloxacin  (CIPRO ) IVPB 400 mg  400 mg Intravenous Q12H Cleotilde Lukes, DO 200 mL/hr at 10/26/23 0842 400 mg at 10/26/23 0842   feeding supplement (ENSURE PLUS HIGH PROTEIN) liquid 237 mL  237 mL Oral BID BM Roseann Adine PARAS., MD   237 mL at 10/26/23 0843   folic acid  (FOLVITE ) tablet 1 mg  1 mg Oral Daily Stoneking, Bradley J., MD   1 mg at 10/26/23 0844   folic acid  injection 1 mg  1 mg Intravenous Once McIntyre, Brittany J, MD       gabapentin  (NEURONTIN ) capsule 300 mg  300 mg Oral BID Theophilus Pagan, MD       insulin aspart (novoLOG) injection 0-9 Units  0-9 Units Subcutaneous TID WC Roseann Adine PARAS., MD   2 Units at 10/25/23 1755   metroNIDAZOLE  (FLAGYL ) IVPB 500 mg  500 mg Intravenous Q12H Cleotilde Lukes, DO 100 mL/hr at 10/26/23 1001 500 mg at 10/26/23 1001   morphine  (PF) 2 MG/ML injection 2 mg  2 mg Intravenous See admin instructions Analise Glotfelty D, MD   2 mg at 10/26/23 1201   multivitamin with minerals tablet 1 tablet  1 tablet Oral Daily Roseann Adine PARAS., MD   1 tablet at 10/26/23 0845   nicotine (NICODERM CQ - dosed in  mg/24 hours) patch 14 mg  14 mg Transdermal Daily Stoneking, Bradley J., MD   14 mg at 10/26/23 0845   ondansetron  (ZOFRAN ) injection 4 mg  4 mg Intravenous Q6H PRN Amirrah Quigley D, MD   4 mg at 10/26/23 1201   ondansetron  (ZOFRAN -ODT) disintegrating tablet 4 mg  4 mg Oral Q8H PRN Alena Morrison, Reagan, MD   4 mg at 10/26/23 0757   oxyCODONE  (Oxy IR/ROXICODONE ) immediate release tablet 5 mg  5 mg Oral Q4H PRN Stoneking, Bradley J., MD   5 mg at 10/26/23 0845   polyethylene glycol (MIRALAX / GLYCOLAX) packet 17 g  17 g Oral Daily Roseann Adine PARAS., MD   17 g at 10/25/23 1716   senna (SENOKOT) tablet 8.6 mg  1 tablet Oral Daily Roseann Adine PARAS., MD   8.6 mg at 10/25/23 1716   sodium chloride  flush (NS) 0.9 % injection 5 mL  5 mL Intracatheter Q8H Jenna Cordella LABOR, MD   5 mL at 10/26/23 1214   thiamine  (VITAMIN B1) tablet 100 mg  100 mg Oral Daily Roseann Adine PARAS., MD   100 mg at 10/26/23 0845   Or   thiamine  (VITAMIN B1) injection 100 mg  100 mg Intravenous Daily Roseann Adine  J., MD   100 mg at 10/25/23 0801     Objective: Vital: Vitals:   10/25/23 1622 10/25/23 2101 10/26/23 0532 10/26/23 1008  BP: 127/77 131/80 138/73 137/75  Pulse: 84 79 78 74  Resp: 12 16 14 14   Temp: (!) 97.5 F (36.4 C) (!) 97.4 F (36.3 C) 98.7 F (37.1 C) 98 F (36.7 C)  TempSrc: Oral Oral Oral Oral  SpO2: 98% 100% 99% 98%  Weight:      Height:       I/Os: I/O last 3 completed shifts: In: 2087.1 [IV Piggyback:2087.1] Out: 1300 [Urine:1000; Emesis/NG output:300]  Physical Exam:  General: Patient is in no apparent distress Lungs: Normal respiratory effort, chest expands symmetrically. GI: The abdomen is soft and nontender without mass. GU: scrotal wound edges clean, packing changed, new mesh underwear given Right PCN with moderately red urine draining Ext: lower extremities symmetric  Lab Results: Recent Labs    10/24/23 0750 10/25/23 0311 10/26/23 0558  WBC 6.5 6.9 7.0   HGB 9.3* 8.9* 8.0*  HCT 27.7* 26.1* 24.4*   Recent Labs    10/24/23 1157 10/25/23 0311 10/26/23 0558  NA 138 136 138  K 3.6 3.7 3.6  CL 103 102 104  CO2 24 23 23   GLUCOSE 107* 195* 111*  BUN 10 17 25*  CREATININE 0.98 1.14 1.33*  CALCIUM  8.8* 8.6* 8.7*   Recent Labs    10/25/23 0821  INR 1.2   No results for input(s): LABURIN in the last 72 hours. Results for orders placed or performed during the hospital encounter of 10/22/23  Blood Culture (routine x 2)     Status: None (Preliminary result)   Collection Time: 10/22/23  9:01 PM   Specimen: BLOOD  Result Value Ref Range Status   Specimen Description BLOOD SITE NOT SPECIFIED  Final   Special Requests   Final    BOTTLES DRAWN AEROBIC AND ANAEROBIC Blood Culture results may not be optimal due to an inadequate volume of blood received in culture bottles   Culture   Final    NO GROWTH 4 DAYS Performed at [facility], [address] [place], [place] [address]    Report Status PENDING  Incomplete  Blood Culture (routine x 2)     Status: None (Preliminary result)   Collection Time: 10/22/23  9:06 PM   Specimen: BLOOD  Result Value Ref Range Status   Specimen Description BLOOD SITE NOT SPECIFIED  Final   Special Requests   Final    BOTTLES DRAWN AEROBIC AND ANAEROBIC Blood Culture adequate volume   Culture   Final    NO GROWTH 4 DAYS Performed at [facility], [address] [place], [place] [address]    Report Status PENDING  Incomplete    Studies/Results: CT BONE TROCAR/NEEDLE BIOPSY DEEP Result Date: 10/25/2023 INDICATION: Multifocal sclerotic lesions throughout the axial skeleton. EXAM: CT-guided bone lesion biopsy TECHNIQUE: Multidetector CT imaging of the pelvis was performed following the standard protocol without IV contrast. RADIATION DOSE REDUCTION: This exam was performed according to the departmental dose-optimization program which includes automated exposure control, adjustment of the mA  and/or kV according to patient size and/or use of iterative reconstruction technique. MEDICATIONS: None. ANESTHESIA/SEDATION: Moderate (conscious) sedation was employed during this procedure. A total of Versed  mg and Fentanyl  mcg was administered intravenously by the radiology nurse. Total intra-service moderate Sedation Time: minutes. The patient's level of consciousness and vital signs were monitored continuously by radiology nursing throughout  the procedure under my direct supervision. COMPLICATIONS: None immediate. PROCEDURE: Informed written consent was obtained from the patient after a thorough discussion of the procedural risks, benefits and alternatives. All questions were addressed. Maximal Sterile Barrier Technique was utilized including caps, mask, sterile gowns, sterile gloves, sterile drape, hand hygiene and skin antiseptic. A timeout was performed prior to the initiation of the procedure. The patient was placed in a prone position on the CT gantry. Initial axial images of the pelvis were obtained. The more sclerotic region of the left iliac bone was selected as the target. Radiopaque markers were placed on patient's skin. Repeat imaging was performed. Measurements for access were applied. Patient's skin was then prepped, draped, and anesthetized in the usual sterile fashion with 1% lidocaine . 13 gauge biopsy needle was then advanced through a small incision to the left iliac bone. A total of 2 samples were then obtained once the sclerotic region was engaged. The initial sclerotic focus could not be retrieved from the needle. A second sample was obtained which was easily retrieved and sent to pathology. Sterile dressing applied. No immediate complications. IMPRESSION: Satisfactory bone biopsy of a sclerotic region in the left iliac bone. Electronically Signed   By: Cordella Banner   On: 10/25/2023 17:02   DG C-Arm 1-60 Min Result Date: 10/25/2023 CLINICAL DATA:  Right UPJ calculus with  hydronephrosis. EXAM: DG C-ARM 1-60 MIN CONTRAST:  Please see operative note for detail FLUOROSCOPY: Radiation exposure index: 27.37 mGy, air kerma COMPARISON:  CT scan of the abdomen and pelvis 10/22/2023 FINDINGS: A total of 8 intraoperative saved images are submitted for review. Initial spot imaging demonstrates a radiopacity at the L3 level consistent with the known stone. Subsequent images document cannulation of the right ureteral orifice and retrograde ureteral pyelogram. There is mild hydronephrosis. The filling defect persists at the ureteropelvic junction. Some additional irregularity is present at the termination of the study likely representing either introduced bubbles or a small amount of thrombus formation. IMPRESSION: 1. At least partially obstructing right UPJ stone with associated mild to moderate hydronephrosis. 2. Right retrograde ureterogram. Electronically Signed   By: Wilkie Lent M.D.   On: 10/25/2023 10:58    Assessment/Plan: Left urolithiasis: -s/p LEFT PCN tube -will need outpatient surgery with Dr. Roseann for definitive management of the stone  2. Scrotal abscess: -dressing changed today -can be done by nursing moving forward  3. Metastatic cancer unknown primary: -biopsy pending with oncology   Valli Shank, MD Urology 10/26/2023, 12:21 PM

## 2023-10-26 NOTE — Assessment & Plan Note (Addendum)
 Multiple metastases with unknown origin.  Potentially breast versus prostate versus multiple myeloma. Unable to do breast US  inpatient. PSA 4.31.  - Oncology consulted, appreciate recommendations  - Dr. Loretha recommends outpatient follow-up, helping patient set up  - Pain management: Tylenol  650 mg Q6H as needed, oxycodone  5 mg Q4H PRN - Scheduled bowel regimen with MiraLAX and senna once daily - RD consult, appreciate recommendations

## 2023-10-27 DIAGNOSIS — N492 Inflammatory disorders of scrotum: Secondary | ICD-10-CM | POA: Diagnosis not present

## 2023-10-27 LAB — CBC
HCT: 29.4 % — ABNORMAL LOW (ref 39.0–52.0)
Hemoglobin: 9.6 g/dL — ABNORMAL LOW (ref 13.0–17.0)
MCH: 30.8 pg (ref 26.0–34.0)
MCHC: 32.7 g/dL (ref 30.0–36.0)
MCV: 94.2 fL (ref 80.0–100.0)
Platelets: 118 K/uL — ABNORMAL LOW (ref 150–400)
RBC: 3.12 MIL/uL — ABNORMAL LOW (ref 4.22–5.81)
RDW: 20.8 % — ABNORMAL HIGH (ref 11.5–15.5)
WBC: 9.5 K/uL (ref 4.0–10.5)
nRBC: 4.6 % — ABNORMAL HIGH (ref 0.0–0.2)

## 2023-10-27 LAB — BASIC METABOLIC PANEL WITH GFR
Anion gap: 12 (ref 5–15)
BUN: 23 mg/dL (ref 8–23)
CO2: 22 mmol/L (ref 22–32)
Calcium: 9.2 mg/dL (ref 8.9–10.3)
Chloride: 106 mmol/L (ref 98–111)
Creatinine, Ser: 1.37 mg/dL — ABNORMAL HIGH (ref 0.61–1.24)
GFR, Estimated: 57 mL/min — ABNORMAL LOW (ref >60)
Glucose, Bld: 109 mg/dL — ABNORMAL HIGH (ref 70–99)
Potassium: 3.8 mmol/L (ref 3.5–5.1)
Sodium: 140 mmol/L (ref 135–145)

## 2023-10-27 LAB — CULTURE, BLOOD (ROUTINE X 2)
Culture: NO GROWTH
Culture: NO GROWTH
Special Requests: ADEQUATE

## 2023-10-27 LAB — GLUCOSE, CAPILLARY
Glucose-Capillary: 95 mg/dL (ref 70–99)
Glucose-Capillary: 120 mg/dL — ABNORMAL HIGH (ref 70–99)
Glucose-Capillary: 135 mg/dL — ABNORMAL HIGH (ref 70–99)

## 2023-10-27 LAB — FERRITIN: Ferritin: 3921 ng/mL — ABNORMAL HIGH (ref 24–336)

## 2023-10-27 LAB — MAGNESIUM: Magnesium: 2.1 mg/dL (ref 1.7–2.4)

## 2023-10-27 MED ORDER — SENNA 8.6 MG PO TABS: 1.0000 | ORAL_TABLET | Freq: Every day | ORAL | Status: AC | PRN

## 2023-10-27 MED ORDER — METRONIDAZOLE 500 MG PO TABS: 500.0000 mg | ORAL_TABLET | Freq: Two times a day (BID) | ORAL | Status: DC

## 2023-10-27 MED ORDER — CIPROFLOXACIN HCL 500 MG PO TABS
500.0000 mg | ORAL_TABLET | Freq: Two times a day (BID) | ORAL | 0 refills | Status: AC
  Filled 2023-10-27: qty 10, 5d supply, fill #0

## 2023-10-27 MED ORDER — METRONIDAZOLE 500 MG PO TABS
500.0000 mg | ORAL_TABLET | Freq: Two times a day (BID) | ORAL | 0 refills | Status: AC
  Filled 2023-10-27: qty 10, 5d supply, fill #0

## 2023-10-27 MED ORDER — ACETAMINOPHEN 325 MG PO TABS: 650.0000 mg | ORAL_TABLET | Freq: Four times a day (QID) | ORAL | Status: AC | PRN

## 2023-10-27 MED ORDER — VITAMIN B-1 100 MG PO TABS: 100.0000 mg | ORAL_TABLET | Freq: Every day | ORAL | Status: AC

## 2023-10-27 MED ORDER — NICOTINE 14 MG/24HR TD PT24
14.0000 mg | MEDICATED_PATCH | Freq: Every day | TRANSDERMAL | 0 refills | Status: AC
  Filled 2023-10-27: qty 28, 28d supply, fill #0

## 2023-10-27 MED ORDER — ADULT MULTIVITAMIN W/MINERALS CH: 1.0000 | ORAL_TABLET | Freq: Every day | ORAL | Status: AC

## 2023-10-27 MED ORDER — ENSURE PLUS HIGH PROTEIN PO LIQD: 237.0000 mL | Freq: Two times a day (BID) | ORAL | Status: AC

## 2023-10-27 MED ORDER — FOLIC ACID 1 MG PO TABS: 1.0000 mg | ORAL_TABLET | Freq: Every day | ORAL | Status: AC

## 2023-10-27 MED ORDER — CIPROFLOXACIN HCL 500 MG PO TABS: 500.0000 mg | ORAL_TABLET | Freq: Two times a day (BID) | ORAL | Status: DC

## 2023-10-27 MED ORDER — POLYETHYLENE GLYCOL 3350 17 G PO PACK: 17.0000 g | PACK | Freq: Every day | ORAL | Status: AC | PRN

## 2023-10-27 MED ORDER — OXYCODONE HCL 5 MG PO TABS
5.0000 mg | ORAL_TABLET | Freq: Four times a day (QID) | ORAL | 0 refills | Status: AC | PRN
  Filled 2023-10-27: qty 20, 5d supply, fill #0

## 2023-10-27 NOTE — Progress Notes (Signed)
 Larry Houston to be D/C'd  per MD order.  Discussed with the patient and all questions fully answered.  VSS, Skin clean, dry and intact without evidence of skin break down, no evidence of skin tears noted.  IV catheter discontinued intact. Site without signs and symptoms of complications. Dressing and pressure applied.  An After Visit Summary was printed and given to the patient. Patient received flushes, dressing gauze, normal saline and tape.   D/c education completed with patient/family including follow up instructions, medication list, d/c activities limitations if indicated, with other d/c instructions as indicated by MD - patient able to verbalize understanding, all questions fully answered.   Patient instructed to return to ED, call 911, or call MD for any changes in condition.   Patient to be escorted via WC, and D/C home via private auto.

## 2023-10-27 NOTE — Discharge Instructions (Addendum)
 Dear Larry Houston,   Thank you for letting us  participate in your care! In this section, you will find a brief hospital admission summary of why you were admitted to the hospital, what happened during your admission, your diagnosis/diagnoses, and recommended follow up.   You were admitted for a scrotal abscess and had a procedure called an incision & drainage completed by Urology. You were started on antibiotics for this which you should continue as listed in the instructions below. You were also found to have a stone in your ureter for which a nephrostomy tube was placed. Urology will follow up about definitive treatment.  We also completed work-up for malignancy. We got a bone biopsy which is still pending. Hematology/oncology will work with you on discharge for treatment.  To care for your wounds and tube: Cleanse skin to bilateral axillae and perineal (scrotal) wounds with VASHE (LAWSON # [id number]) and allow to air dry. Apply Aquacel (Lawson [id number]) to open/draining areas (arm pit or scrotum). Cover with dry gauze and kerlix to secure (around scrotum or shoulder) change daily. Daily dressing changes - saline soaked small kerlex to scrotal wound Flush Drainage Catheter: Normal Saline 5 mL three times daily.  POST-HOSPITAL & CARE INSTRUCTIONS We recommend following up with your PCP within 1 week from being discharged from the hospital. Please let PCP/Specialists know of any changes in medications that were made which you will be able to see in the medications section of this packet. Please also follow up with urology and hematology/oncology. They will reach out to you about future appointments. We prescribed you two antibiotics called Ciprofloxacin  and Metronidazole . The last day for these antibiotics will be 10/24. Make sure to complete the full course. Please make sure to complete your Advanced Directive as well   DOCTOR'S APPOINTMENTS & FOLLOW UP Future Appointments  Date Time Provider  Department Center  11/01/2023  9:30 AM [facility] 2 [facility] [place]     Thank you for choosing [facility]! Take care and be well!  Family Medicine Teaching Service Inpatient Team [place]  [facility]  [address] [place], [place] [address] [phone]

## 2023-10-27 NOTE — Assessment & Plan Note (Addendum)
 Multiple metastases with unknown origin.  Potentially breast versus prostate versus multiple myeloma. Unable to do breast US  inpatient. PSA 4.31. Bone biopsy 10/17, results pending. - Oncology consulted, appreciate recommendations  - Dr. Loretha recommends outpatient follow-up, helping patient set up  - Pain management: Tylenol  650 mg Q6H as needed, oxycodone  5 mg Q4H PRN - Scheduled bowel regimen with MiraLAX and senna once daily - RD consult, appreciate recommendations

## 2023-10-27 NOTE — Assessment & Plan Note (Addendum)
 Afebrile, hemodynamically stable. Day 3 s/p I&D by Urology. Final Bcx with NG @ 5 days - Transition to PO antibiotics today: PO Ciprofloxacin  500 mg BID and PO metronidazole  500 mg BID - Urology consulted, appreciate recommendations - Nausea: Zofran  ODT 4 mg Q8H PRN - AM CBC, BMP

## 2023-10-27 NOTE — Plan of Care (Signed)
  Problem: Fluid Volume: Goal: Ability to maintain a balanced intake and output will improve Outcome: Progressing   Problem: Metabolic: Goal: Ability to maintain appropriate glucose levels will improve Outcome: Progressing   Problem: Skin Integrity: Goal: Risk for impaired skin integrity will decrease Outcome: Progressing   Problem: Clinical Measurements: Goal: Will remain free from infection Outcome: Progressing Goal: Cardiovascular complication will be avoided Outcome: Progressing   Problem: Activity: Goal: Risk for activity intolerance will decrease Outcome: Progressing   Problem: Nutrition: Goal: Adequate nutrition will be maintained Outcome: Progressing

## 2023-10-27 NOTE — Plan of Care (Signed)

## 2023-10-27 NOTE — Assessment & Plan Note (Addendum)
 Now s/p nephrostomy tube placed 10/16. Will likely need definitive management outpatient.   - Urology consulted as above, appreciate recommendations

## 2023-10-27 NOTE — Discharge Summary (Addendum)
 Family Medicine Teaching [facility] Discharge Summary  Patient name: Larry Houston Medical record number: [id number] Date of birth: 1955-09-03 Age: 68 y.o. Gender: male Date of Admission: 10/22/2023  Date of Discharge: 10/27/23 Admitting Physician: Elio Alena Morrison, MD  Primary Care Provider: Larraine Palma, MD Consultants: Urology, Oncology  Indication for Hospitalization: Scrotal abscess  Discharge Diagnoses/Problem List:  Principal Problem for Admission: scrotal abscess, metastatic disease, R uterolithiasis Other Problems addressed during stay:  Principal Problem:   Scrotal abscess Active Problems:   Alcohol  use (HCC)   Acute anemia   Metastatic disease (HCC)   Hidradenitis suppurativa of multiple sites   Transaminitis   Mass of left breast   Nephrolithiasis   Hydronephrosis of right kidney   Ureteral calculus, right   Protein-calorie malnutrition, severe  Brief Hospital Course:  Larry Houston is a 68 y.o.male with history of HTN, CVA, chronic EtOH use, and chronic back pain who was admitted to the [facility] Medicine Teaching Service at [facility] for perineal cellulitis and metastatic cancer. His hospital course is outlined below:  Scrotal abscess Patient initially presented with pain and swelling in the perineal region and scrotal lesion that began spontaneously draining in the ED. Elevated lactic acid and tachycardia initially but patient did not meet sepsis criteria and both improved with IVF. Blood cultures were collected and patient started on empiric treatment with Vancomycin  and Cefepime. Patient also reported recent fecal incontinence concerning for anaerobic contamination of wound and antibiotics were adjusted accordingly. Workup included CTAP which showed mild scrotal wall thickening and edema but no abscess. Urology was consulted and I&D was performed on 10/16 (unable to collect intraoperative cultures). Blood cultures showed no growth and patient remained  afebrile throughout admission. Final antibiotic regimen included IV Ciprofloxacin  and IV Metronidazole  which were transitioned to PO upon discharge.  Metastatic disease of unknown origin Patient recently seen by PCP for unintentional weight loss (down to 173 from 230lbs 1 year ago). CTAP showed diffuse sclerotic metastases throughout the entire visualized axial and appendicular skeleton, presumably prostate cancer.  Also noted to have subpleural micronodularity measuring up to 4 mm in the lingula. CTA obtained for further workup showed 8.3 cm left breast mass with overlying cutaneous thickening suspicious for male breast cancer.  Also noted to have left axillary, left subpectoral, and subcarinal nodal metastases; subpleural pulmonary nodularity suspicious metastases; diffuse osseous metastases also noted throughout entire visualized axial and appendicular skeleton. Unable to obtain breast ultrasound inpatient. Workup includes protein electrophoresis with elevated alpha-1-globulin and gamma globulin, elevated kappa and lamda chains and elevated PSA. Oncology was consulted who recommended IR-guided bone biopsy which was performed on 10/17; results are pending at the time of discharge. Oncology to arrange outpatient follow up for further work-up and treatment.  Right proximal ureteral calculus CTAP also showed moderate R hydronephrosis with associated 12mm proximal R uretral stone. Urology was consulted and performed R retrograde pyelogram and cystoscopy at the time of scrotal I&D. Placement of R ureteral stent placement was unsuccessful so nephrostomy tube was placed by IR on 10/17 (at the time of bone biopsy). Tube was draining well at the time of discharge and patient was provided education about dressing changes and drain maintenance. Patient will require outpatient surgery with Dr. Roseann for definitive management of stone.  Transaminitis/Alcohol  use Likely 2/2 chronic EtOH use and in part due to bony  lesions. Hepatitis panel was nonreactive. LFTs were downtrending at the time of discharge. Patient did not show signs of alcohol  withdrawal during admission.  Anemia Hgb 8.3 > 6.6. Patient was transfused with 1 unit PRBCs. Hemoglobin remained stable at 8-9 after. High saturation, low TIBC and elevated ferritin indicate this is likely anemia of chronic disease.  Hidradenitis suppurativa Patient presented with several weeks of indurated, tunneled, draining axillary lesions. This may be HS or lymphadenopathy from metastatic cancer. He will continue to follow up outpatient for continued management.  Other chronic conditions were medically managed with home medications and formulary alternatives as necessary (T2DM, HTN, h/o CVA)  PCP Follow-up recommendations: Consider repeat Echo Complete Advanced Directive Ensure follow up with Urology and Oncology  Results/Tests Pending at Time of Discharge:  Unresulted Labs (From admission, onward)     Start     Ordered   10/28/23 0500  CBC  Tomorrow morning,   R       Question:  Specimen collection method  Answer:  Lab=Lab collect   10/27/23 1137   10/28/23 0500  Comprehensive metabolic panel with GFR  Tomorrow morning,   R       Question:  Specimen collection method  Answer:  Lab=Lab collect   10/27/23 1137   10/23/23 1044  Aerobic Culture w Gram Stain (superficial specimen)  Once,   R       Comments: Culture of drainage from scrotal cellulits    10/23/23 1052           Disposition: Home  Discharge Condition: Stable  Discharge Exam:  Vitals:   10/27/23 0953 10/27/23 1721  BP: 137/84 (!) 149/72  Pulse: (!) 106 95  Resp: 16 16  Temp: 98.9 F (37.2 C) 98.3 F (36.8 C)  SpO2: 97% 100%   General: Alert, well-appearing male in NAD.  Cardiovascular: RRR, no m/r/g appreciated. Pulmonary: Normal WOB. CTAB with no w/c/r present.  Abdomen: Soft, non-tender, non-distended. Genitourinary: Scrotum clean and dry, packing in place per Dr.  Jerrie Extremities: Warm and well-perfused, without cyanosis or edema. Psych: Appropriate mood and affect  Significant Procedures:  CT BONE TROCAR/NEEDLE BIOPSY DEEP Result Date: 10/25/2023  Satisfactory bone biopsy of a sclerotic region in the left iliac bone.  Nephrostomy tube placement by IR - 10/17 (at the time of bone biopsy)  I&D, pyelogram and cystoscopy by Urology 10/16  Significant Labs and Imaging:  Recent Labs  Lab 10/26/23 0558 10/26/23 1826 10/27/23 0827  WBC 7.0 6.8 9.5  HGB 8.0* 8.5* 9.6*  HCT 24.4* 26.2* 29.4*  PLT 106* 109* 118*   Recent Labs  Lab 10/26/23 0558 10/27/23 0827  NA 138 140  K 3.6 3.8  CL 104 106  CO2 23 22  GLUCOSE 111* 109*  BUN 25* 23  CREATININE 1.33* 1.37*  CALCIUM  8.7* 9.2  MG  --  2.1    Pertinent Imaging  DG C-Arm 1-60 Min Result Date: 10/25/2023 1. At least partially obstructing right UPJ stone with associated mild to moderate hydronephrosis.  2. Right retrograde ureterogram.   US  SCROTUM W/DOPPLER Result Date: 10/23/2023  1. No evidence of  abscess.  2. Right varicocele, 3.1 mm in diameter.  3. Left epididymal cystic lesion (likely minimally complex epididymal cyst or spermatocele), 1.1 x 1.0 x 1.0 cm.  4. Normal testicular arterial and venous flow bilaterally.   CT Angio Chest PE W and/or Wo Contrast Result Date: 10/23/2023 1. No pulmonary embolism.  2. 8.3 cm left breast mass with overlying cutaneous thickening, suspicious for male breast cancer.  3. Left axillary, left subpectoral, and subcarinal nodal metastases.  4. Subpleural pulmonary nodularity, suspicious for metastases  in this clinical setting.  5. Diffuse osseous metastases throughout the visualized axial and appendicular skeleton.  6. Trace bilateral pleural effusions.   CT ABDOMEN PELVIS W CONTRAST Result Date: 10/22/2023 1. Moderate right hydronephrosis with associated 12 mm proximal right ureteral calculus at the L3-L4 level.  2. Mild scrotal wall  thickening/edema without discrete abscess on CT.  3. Diffuse sclerotic metastases throughout the visualized axial and appendicular skeleton. Correlate for history of malignancy, presumably prostate CA.  4. Subpleural micronodularity measuring up to 4 mm in the lingula. In the setting of suspected primary malignancy, Fleischner Society guidelines do not apply. Consider follow-up CT chest as clinically warranted.   DG Thoracic Spine W/Swimmers Result Date: 10/22/2023 Questionable mottled/sclerotic appearance of the thoracic spine. Consider further evaluation with CT to exclude sclerotic lesions/metastases.  DG Lumbar Spine Complete Result Date: 10/22/2023 Degenerative facet disease in the lower lumbar spine. No acute bony abnormality.   Discharge Medications:  Allergies as of 10/27/2023       Reactions   Penicillins Hives   Has patient had a PCN reaction causing immediate rash, facial/tongue/throat swelling, SOB or lightheadedness with hypotension: No Has patient had a PCN reaction causing severe rash involving mucus membranes or skin necrosis: No Has patient had a PCN reaction that required hospitalization No Has patient had a PCN reaction occurring within the last 10 years: No If all of the above answers are NO, then may proceed with Cephalosporin use.        Medication List     PAUSE taking these medications    clindamycin  1 % external solution Wait to take this until your doctor or other care provider tells you to start again. Commonly known as: CLEOCIN  T Apply 1 Application topically 2 (two) times daily.       TAKE these medications    acetaminophen  325 MG tablet Commonly known as: TYLENOL  Take 2 tablets (650 mg total) by mouth every 6 (six) hours as needed for mild pain (pain score 1-3).   amLODipine  5 MG tablet Commonly known as: NORVASC  Take 1 tablet (5 mg total) by mouth daily before breakfast.   Aspirin  Low Dose 81 MG tablet Generic drug: aspirin  EC Take 1  tablet (81 mg total) by mouth daily. Swallow whole.   atorvastatin  80 MG tablet Commonly known as: LIPITOR  Take 1 tablet (80 mg total) by mouth daily.   ciprofloxacin  500 MG tablet Commonly known as: CIPRO  Take 1 tablet (500 mg total) by mouth 2 (two) times daily for 5 days.   feeding supplement Liqd Take 237 mLs by mouth 2 (two) times daily between meals. Start taking on: October 28, 2023   folic acid  1 MG tablet Commonly known as: FOLVITE  Take 1 tablet (1 mg total) by mouth daily. Start taking on: October 28, 2023   gabapentin  300 MG capsule Commonly known as: NEURONTIN  Take 1 capsule (300 mg total) by mouth 2 (two) times daily.   metroNIDAZOLE  500 MG tablet Commonly known as: FLAGYL  Take 1 tablet (500 mg total) by mouth every 12 (twelve) hours for 5 days.   multivitamin with minerals Tabs tablet Take 1 tablet by mouth daily. Start taking on: October 28, 2023   nicotine 14 mg/24hr patch Commonly known as: NICODERM CQ - dosed in mg/24 hours Place 1 patch (14 mg total) onto the skin daily. Start taking on: October 28, 2023   oxyCODONE  5 MG immediate release tablet Commonly known as: Oxy IR/ROXICODONE  Take 1 tablet (5 mg total) by mouth every  6 (six) hours as needed for up to 5 days for moderate pain (pain score 4-6) or severe pain (pain score 7-10).   polyethylene glycol 17 g packet Commonly known as: MIRALAX / GLYCOLAX Take 17 g by mouth daily as needed.   senna 8.6 MG Tabs tablet Commonly known as: SENOKOT Take 1 tablet (8.6 mg total) by mouth daily as needed for mild constipation.   thiamine  100 MG tablet Commonly known as: Vitamin B-1 Take 1 tablet (100 mg total) by mouth daily. Start taking on: October 28, 2023        Discharge Instructions: Please refer to Patient Instructions section of EMR for full details.  Patient was counseled important signs and symptoms that should prompt return to medical care, changes in medications, dietary instructions,  activity restrictions, and follow up appointments.   Follow-Up Appointments:  Follow-up Information     Nicholas Bar, MD. Go on 10/29/2023.   Specialty: Family Medicine Why: at 11:30 AM for hospital follow up Contact information: [address] [place] [address] [phone]                 Jerrie Gathers, DO 10/27/2023, 5:51 PM PGY-1, [facility]  I agree with the assessment and plan as documented above.  Stuart Redo, MD PGY-3, [facility]

## 2023-10-27 NOTE — Assessment & Plan Note (Addendum)
 Likely 2/2 to alcohol  use.  Hepatitis panel nonreactive.  No known history of alcohol  withdrawal or seizures. - CIWA protocol Discontinued  - Nutritional supplements including thiamine  - AM CMP

## 2023-10-27 NOTE — Assessment & Plan Note (Signed)
 Patient with several weeks of indurated, tunneled, draining axillary lesions concerning for HS.  Potentially lymphadenopathy from metastatic cancer. - Monitor symptom progression with IV antibiotic management

## 2023-10-27 NOTE — Assessment & Plan Note (Signed)
 HTN: amlodipine  5 mg every day  History of CVA: aspirin  81 mg every day, lipitor  80 mg every day  T2DM: last A1c on 09/26/23 7.1

## 2023-10-27 NOTE — Progress Notes (Addendum)
 Daily Progress Note Intern Pager: [phone]  Patient name: Larry Houston Medical record number: [id number] Date of birth: 04-13-1955 Age: 68 y.o. Gender: male  Primary Care Provider: Larraine Palma, MD Consultants: Urology, Oncology Code Status: DNR-Limited  [name], s/p 1 unit PRBCs  10/16: Scrotal I&D 10/17: Bone biopsy and nephrostomy tube placed  Assessment and Plan:  Larry Houston is a 68 y.o. male with a PMH of HTN, chronic EtOH use, and chronic back pain who presented for a scrotal abscess, now s/p I&D. Found to have metastses on imaging, oncology following and will see him outpatient for further treatment. S/p bone biopsy. Plan to transition to PO abx today and can d/c if able to tolerate. Assessment & Plan Scrotal abscess Afebrile, hemodynamically stable. Day 3 s/p I&D by Urology. Final Bcx with NG @ 5 days - Transition to PO antibiotics today: PO Ciprofloxacin  500 mg BID and PO metronidazole  500 mg BID - Urology consulted, appreciate recommendations - Nausea: Zofran  ODT 4 mg Q8H PRN - AM CBC, BMP Metastatic disease (HCC) Multiple metastases with unknown origin.  Potentially breast versus prostate versus multiple myeloma. Unable to do breast US  inpatient. PSA 4.31. Bone biopsy 10/17, results pending. - Oncology consulted, appreciate recommendations  - Dr. Loretha recommends outpatient follow-up, helping patient set up  - Pain management: Tylenol  650 mg Q6H as needed, oxycodone  5 mg Q4H PRN - Scheduled bowel regimen with MiraLAX and senna once daily - RD consult, appreciate recommendations Acute anemia Hgb 9.6. Low TIBC, high ferritin - likely anemia of chronic disease. - AM CBC Hydronephrosis of right kidney Nephrolithiasis Now s/p nephrostomy tube placed 10/16. Will likely need definitive management outpatient.   - Urology consulted as above, appreciate recommendations Alcohol  use (HCC) Transaminitis Likely 2/2  to alcohol  use.  Hepatitis panel nonreactive.  No known history of alcohol  withdrawal or seizures. - CIWA protocol Discontinued  - Nutritional supplements including thiamine  - AM CMP Hidradenitis suppurativa of multiple sites Patient with several weeks of indurated, tunneled, draining axillary lesions concerning for HS.  Potentially lymphadenopathy from metastatic cancer. - Monitor symptom progression with IV antibiotic management Chronic health problem HTN: amlodipine  5 mg every day  History of CVA: aspirin  81 mg every day, lipitor  80 mg every day  T2DM: last A1c on 09/26/23 7.1  - Stat spiritual care consult today for advanced directive  FEN/GI: Regular PPx: Lovenox  Dispo:Home today. Barriers include tolerate PO abx, advanced directive.   Subjective:  Patient states he is doing well. Continues to endorse pain at site of nephrostomy tube. Reports 1 episode of vomiting with liquids yesterday. No blood in the vomit.  Objective: Temp:  [97.6 F (36.4 C)-98.2 F (36.8 C)] 98 F (36.7 C) (10/19 0529) Pulse Rate:  [74-97] 97 (10/19 0529) Resp:  [14-18] 18 (10/19 0529) BP: (137-156)/(73-83) 156/73 (10/19 0529) SpO2:  [98 %-100 %] 98 % (10/19 0529)  Physical Exam: General: Alert, well-appearing male in NAD.  Cardiovascular: RRR, no m/r/g appreciated. Pulmonary: Normal WOB. CTAB with no w/c/r present.  Abdomen: Soft, non-tender, non-distended. Extremities: Warm and well-perfused, without cyanosis or edema. Psych: Appropriate mood and affect  Laboratory: Most recent CBC Lab Results  Component Value Date   WBC 6.8 10/26/2023   HGB 8.5 (L) 10/26/2023   HCT 26.2 (L) 10/26/2023   MCV 93.6 10/26/2023   PLT 109 (L) 10/26/2023   Most recent BMP    Latest Ref Rng & Units 10/26/2023  5:58 AM  BMP  Glucose 70 - 99 mg/dL 888   BUN 8 - 23 mg/dL 25   Creatinine 9.38 - 1.24 mg/dL 8.66   Sodium 864 - 854 mmol/L 138   Potassium 3.5 - 5.1 mmol/L 3.6   Chloride 98 - 111 mmol/L 104    CO2 22 - 32 mmol/L 23   Calcium  8.9 - 10.3 mg/dL 8.7     Other pertinent labs: Ferritin 3921   Imaging/Diagnostic Tests: No new imaging  Jerrie Gathers, DO 10/27/2023, 7:49 AM  PGY-1, [place] [facility] FPTS Intern pager: [phone], text pages welcome Secure chat group [facility] Teaching Service

## 2023-10-27 NOTE — Assessment & Plan Note (Addendum)
 Hgb 9.6. Low TIBC, high ferritin - likely anemia of chronic disease. - AM CBC

## 2023-10-27 NOTE — Progress Notes (Signed)
 Referring Physician(s): Dr. Valli Shank  Supervising Physician: Karalee Beat  Patient Status:  [facility] - In-pt  Chief Complaint: Obstructive uropathy  Subjective: Sitting up in bed.  Conversant.  No complaints related to drain.  Bloody output in gravity bag, but appears to be clearing.   Allergies: Penicillins  Medications: Prior to Admission medications   Medication Sig Start Date End Date Taking? Authorizing Provider  amLODipine  (NORVASC ) 5 MG tablet Take 1 tablet (5 mg total) by mouth daily before breakfast. 07/23/19  Yes Danton Reyes DASEN, MD  aspirin  EC 81 MG tablet Take 1 tablet (81 mg total) by mouth daily. Swallow whole. 10/14/23  Yes Larraine Palma, MD  atorvastatin  (LIPITOR ) 80 MG tablet Take 1 tablet (80 mg total) by mouth daily. 07/24/19  Yes Danton Reyes DASEN, MD  clindamycin  (CLEOCIN  T) 1 % external solution Apply 1 Application topically 2 (two) times daily. 09/16/23  Yes   gabapentin  (NEURONTIN ) 300 MG capsule Take 1 capsule (300 mg total) by mouth 2 (two) times daily. 10/14/23  Yes Larraine Palma, MD     Vital Signs: BP 137/84 (BP Location: Left Arm)   Pulse (!) 106   Temp 98.9 F (37.2 C) (Oral)   Resp 16   Ht 6' (1.829 m)   Wt 176 lb (79.8 kg)   SpO2 97%   BMI 23.87 kg/m   Physical Exam Vitals and nursing note reviewed.  Constitutional:      Appearance: Normal appearance.  Neurological:     Mental Status: He is alert.   Abdomen: soft, non-tender Flank: R PCN in place.  Site intact.  Flushes and aspirates easily.  Blood-tinged output in gravity bag-- appears to be clearing.   Imaging: CT BONE TROCAR/NEEDLE BIOPSY DEEP Result Date: 10/25/2023 INDICATION: Multifocal sclerotic lesions throughout the axial skeleton. EXAM: CT-guided bone lesion biopsy TECHNIQUE: Multidetector CT imaging of the pelvis was performed following the standard protocol without IV contrast. RADIATION DOSE REDUCTION: This exam was performed according to the  departmental dose-optimization program which includes automated exposure control, adjustment of the mA and/or kV according to patient size and/or use of iterative reconstruction technique. MEDICATIONS: None. ANESTHESIA/SEDATION: Moderate (conscious) sedation was employed during this procedure. A total of Versed  mg and Fentanyl  mcg was administered intravenously by the radiology nurse. Total intra-service moderate Sedation Time: minutes. The patient's level of consciousness and vital signs were monitored continuously by radiology nursing throughout the procedure under my direct supervision. COMPLICATIONS: None immediate. PROCEDURE: Informed written consent was obtained from the patient after a thorough discussion of the procedural risks, benefits and alternatives. All questions were addressed. Maximal Sterile Barrier Technique was utilized including caps, mask, sterile gowns, sterile gloves, sterile drape, hand hygiene and skin antiseptic. A timeout was performed prior to the initiation of the procedure. The patient was placed in a prone position on the CT gantry. Initial axial images of the pelvis were obtained. The more sclerotic region of the left iliac bone was selected as the target. Radiopaque markers were placed on patient's skin. Repeat imaging was performed. Measurements for access were applied. Patient's skin was then prepped, draped, and anesthetized in the usual sterile fashion with 1% lidocaine . 13 gauge biopsy needle was then advanced through a small incision to the left iliac bone. A total of 2 samples were then obtained once the sclerotic region was engaged. The initial sclerotic focus could not be retrieved from the needle. A second sample was obtained which was easily retrieved and sent to pathology. Sterile dressing  applied. No immediate complications. IMPRESSION: Satisfactory bone biopsy of a sclerotic region in the left iliac bone. Electronically Signed   By: Cordella Banner   On: 10/25/2023  17:02   DG C-Arm 1-60 Min Result Date: 10/25/2023 CLINICAL DATA:  Right UPJ calculus with hydronephrosis. EXAM: DG C-ARM 1-60 MIN CONTRAST:  Please see operative note for detail FLUOROSCOPY: Radiation exposure index: 27.37 mGy, air kerma COMPARISON:  CT scan of the abdomen and pelvis 10/22/2023 FINDINGS: A total of 8 intraoperative saved images are submitted for review. Initial spot imaging demonstrates a radiopacity at the L3 level consistent with the known stone. Subsequent images document cannulation of the right ureteral orifice and retrograde ureteral pyelogram. There is mild hydronephrosis. The filling defect persists at the ureteropelvic junction. Some additional irregularity is present at the termination of the study likely representing either introduced bubbles or a small amount of thrombus formation. IMPRESSION: 1. At least partially obstructing right UPJ stone with associated mild to moderate hydronephrosis. 2. Right retrograde ureterogram. Electronically Signed   By: Wilkie Lent M.D.   On: 10/25/2023 10:58   US  SCROTUM W/DOPPLER Result Date: 10/23/2023 EXAM: ULTRASOUND SCROTUM/TESTICLES WITH DOPPLER FLOW EVALUATION 10/23/2023 02:13:05 PM TECHNIQUE: Duplex ultrasound using B-mode/gray scaled imaging, Doppler spectral analysis and color flow Doppler was obtained of the testicles. COMPARISON: None available. CLINICAL HISTORY: Abscess. FINDINGS: RIGHT: MEASUREMENTS: The right testicle measures 3.9 x 1.9 x 2.3 cm. GREY SCALE: The right testicle demonstrates normal homogeneous echotexture without focal lesion. No testicular microlithiasis. DOPPLER EVALUATION: There is normal arterial and venous Doppler flow within the testicle. VARICOCELE: Right varicocele noted, 3.1 mm in diameter. SCROTAL SAC: No hydrocele. EPIDIDYMIS: No acute abnormality. LEFT: MEASUREMENTS: The left testicle measures 3.5 x 2.0 x 2.1 cm. GREY SCALE: The left testicle demonstrates normal homogeneous echotexture without focal  lesion. No testicular microlithiasis. DOPPLER EVALUATION: There is normal arterial and venous Doppler flow within the testicle. VARICOCELE: No scrotal varicocele. SCROTAL SAC: No hydrocele. EPIDIDYMIS: 1.1 x 1.0 x 1.0 cm left epididymal cystic lesion potentially with some minimal layering complexity but no internal blood flow noted. Likely a minimally complex epididymal cyst or spermatocele. Appearance is not characteristic of epididymal abscess. IMPRESSION: 1. No evidence of  abscess. 2. Right varicocele, 3.1 mm in diameter. 3. Left epididymal cystic lesion (likely minimally complex epididymal cyst or spermatocele), 1.1 x 1.0 x 1.0 cm. 4. Normal testicular arterial and venous flow bilaterally. Electronically signed by: Ryan Salvage MD 10/23/2023 02:40 PM EDT RP Workstation: [id number]    Labs:  CBC: Recent Labs    10/25/23 0311 10/26/23 0558 10/26/23 1826 10/27/23 0827  WBC 6.9 7.0 6.8 9.5  HGB 8.9* 8.0* 8.5* 9.6*  HCT 26.1* 24.4* 26.2* 29.4*  PLT 101* 106* 109* 118*    COAGS: Recent Labs    10/25/23 0821  INR 1.2    BMP: Recent Labs    10/24/23 1157 10/25/23 0311 10/26/23 0558 10/27/23 0827  NA 138 136 138 140  K 3.6 3.7 3.6 3.8  CL 103 102 104 106  CO2 24 23 23 22   GLUCOSE 107* 195* 111* 109*  BUN 10 17 25* 23  CALCIUM  8.8* 8.6* 8.7* 9.2  CREATININE 0.98 1.14 1.33* 1.37*  GFRNONAA >60 >60 59* 57*    LIVER FUNCTION TESTS: Recent Labs    10/22/23 2230 10/23/23 0453 10/24/23 1157 10/25/23 0311  BILITOT 1.4* 1.3* 1.8* 1.4*  AST 227* 205* 298* 245*  ALT 141* 136* 226* 211*  ALKPHOS 491* 451* 487* 508*  PROT  6.9 6.4* 7.1 7.1  ALBUMIN 2.8* 2.4* 2.7* 2.5*    Assessment and Plan: Scrotal abscess Metastatic disease on unknown primary s/p iliac bone lesion biopsy by Dr. Jenna 10/17 R PCN placement 10/17 by Dr. Jenna for obstructive uropathy Patient assessed at bedside.  Adequate UOP from R PCN.  Blood-tinged urine which appears to be thinning with  stable Hgb.   Further management of PCN/stone per Urology.   Continue current drain management.   Electronically Signed: Yarielys Beed Sue-Ellen Astaria Nanez, PA 10/27/2023, 1:55 PM   I spent a total of 15 Minutes at the the patient's bedside AND on the patient's hospital floor or unit, greater than 50% of which was counseling/coordinating care for obstructive uropathy.

## 2023-10-28 ENCOUNTER — Telehealth: Payer: Self-pay

## 2023-10-28 ENCOUNTER — Telehealth: Payer: Self-pay | Admitting: Urology

## 2023-10-28 ENCOUNTER — Other Ambulatory Visit (HOSPITAL_COMMUNITY): Payer: Self-pay

## 2023-10-28 NOTE — Telephone Encounter (Signed)
 Appt has been scheduled for 11/05/23 arrival time is 11:00am. Left a detailed message for patient to call back to confirm appt. Larry Houston

## 2023-10-28 NOTE — Transitions of Care (Post Inpatient/ED Visit) (Signed)
   10/28/2023  Name: Larry Houston MRN: [id number] DOB: [birth year]  Today's TOC FU Call Status: Today's TOC FU Call Status:: Unsuccessful Call (1st Attempt) Unsuccessful Call (1st Attempt) Date: 10/28/23  Attempted to reach the patient regarding the most recent Inpatient/ED visit.  Follow Up Plan: Additional outreach attempts will be made to reach the patient to complete the Transitions of Care (Post Inpatient/ED visit) call.   Signature  Charmaine Bloodgood, LPN [facility] Advisor [place] [facility] You Are. We Are. One [facility] Direct Dial [phone]

## 2023-10-28 NOTE — Telephone Encounter (Signed)
-----   Message from Adine Manly sent at 10/28/2023  1:27 PM EDT ----- Please schedule appt for patient in next 10-14 days.  Seen as consult in hospital.

## 2023-10-29 ENCOUNTER — Other Ambulatory Visit (HOSPITAL_COMMUNITY): Payer: Self-pay

## 2023-10-29 ENCOUNTER — Ambulatory Visit (INDEPENDENT_AMBULATORY_CARE_PROVIDER_SITE_OTHER): Payer: Self-pay | Admitting: Family Medicine

## 2023-10-29 ENCOUNTER — Telehealth: Payer: Self-pay | Admitting: Hematology and Oncology

## 2023-10-29 ENCOUNTER — Encounter: Payer: Self-pay | Admitting: Family Medicine

## 2023-10-29 VITALS — BP 102/62 | HR 102 | Ht 72.0 in | Wt 169.0 lb

## 2023-10-29 DIAGNOSIS — I5020 Unspecified systolic (congestive) heart failure: Secondary | ICD-10-CM

## 2023-10-29 DIAGNOSIS — F1090 Alcohol use, unspecified, uncomplicated: Secondary | ICD-10-CM

## 2023-10-29 DIAGNOSIS — R7401 Elevation of levels of liver transaminase levels: Secondary | ICD-10-CM

## 2023-10-29 DIAGNOSIS — N200 Calculus of kidney: Secondary | ICD-10-CM | POA: Diagnosis not present

## 2023-10-29 DIAGNOSIS — C799 Secondary malignant neoplasm of unspecified site: Secondary | ICD-10-CM

## 2023-10-29 DIAGNOSIS — N492 Inflammatory disorders of scrotum: Secondary | ICD-10-CM

## 2023-10-29 DIAGNOSIS — I10 Essential (primary) hypertension: Secondary | ICD-10-CM

## 2023-10-29 DIAGNOSIS — R110 Nausea: Secondary | ICD-10-CM | POA: Diagnosis not present

## 2023-10-29 MED ORDER — ONDANSETRON 4 MG PO TBDP
4.0000 mg | ORAL_TABLET | Freq: Three times a day (TID) | ORAL | 0 refills | Status: AC | PRN
  Filled 2023-10-29: qty 15, 5d supply, fill #0

## 2023-10-29 NOTE — Telephone Encounter (Signed)
 left vm for patient about scheduled appt date and time. Encouraged to call back if need to reschedule

## 2023-10-29 NOTE — Patient Instructions (Signed)
 It was wonderful to see you today.  Please bring ALL of your medications with you to every visit.   Today we talked about:  Hospital follow up - We can hold off on labs for now since you just had them drawn at the hospital.   Scrotal abscess - Does look like it is healing well. We redid the packing today. Repack the wound daily. Take an oxycodone  about 30 mins before the dressing changes.   Let us  know if you do not hear from the oncologist regarding follow up.   Please follow up in 1 month for annual physical   Thank you for choosing [facility].   Please call [phone] with any questions about today's appointment.  Please be sure to schedule follow up at the front desk before you leave today.   Areta Saliva, MD  Family Medicine

## 2023-10-29 NOTE — Progress Notes (Signed)
 "  SUBJECTIVE:   CHIEF COMPLAINT / HPI:  Discussed the use of AI scribe software for clinical note transcription with the patient, who gave verbal consent to proceed.  History of Present Illness Larry Houston is a 68 year old male who presents for hospital follow up.   Nephrolithiasis -  Continues to have some right sided back pain and nausea. Zofran  has been helpful. No vomiting  - Continues to have serosanguinous drainage ~ 300-400 mL per day.  - Planned lithotripsy procedure with urology  - Urology follow-up scheduled for October 28th  Scrotal abscess - Has pain but Is manageable  - Last dressing change 2 days ago due to misunderstanding on how to do the dressing change and which solution to wet the dressing.  - No increase in pain, redness, or drainage  Abnormal liver function tests - Previously elevated liver function tests in the hospital thought to be due to chronic alcohol  use. Hepatitis labs were negative.  - Abstinent from alcohol  since hospital discharge  Diabetes mellitus - Patient did not realize he had DM.  - Not started on insulin  or other medications - A1c was 7.1 one month ago, to 6.3 six days ago  Anemia - No light headedness or palpitations since leaving the hospital.   Draining axillary lesions - Draining lesions in axillae with some improvement  Heart failure with reduced ejection fraction - History of heart failure with decreased ejection fraction on 2021 echocardiogram - No shortness of breath - Sleeps on one pillow at night  Metastatic cancer  Has newly diagnosed metastatic cancer  - Has not gotten call from oncologist to make outpatient appt yet, but expects call later this week.     PERTINENT  PMH / PSH: HFrEF, alcohol  use, metastatic cancer, nephrolithiasis, anemia of chronic disease  OBJECTIVE:  Ht 6' (1.829 m)   Wt 169 lb (76.7 kg)   BMI 22.92 kg/m    General: chronically ill appearing, in no acute distress CV: RRR, radial pulses  equal and palpable, no BLE edema  Resp: Normal work of breathing on room air, CTAB Abd: Soft, non tender, non distended, right flank with drain placed, clean dry, some CVA tenderness  GU (chaperoned by attending): ~3cm scrotal lesion packed with kerlix, clean dry, no erythema or edema some bleeding with removal of guaze  Neuro: Alert & Oriented x 4   ASSESSMENT/PLAN:   Assessment & Plan Scrotal abscess Improving. Re packed wound in clinic and taught roommate how to pack it.  Follow up with urology next week.  Advised patient to take oxycodone  30 mins before dressing changes  Nausea Continues to have nausea; though no vomiting. Has already had about 50 lbs weight loss most likely due to cancer. Nausea could be related to metastatic disease or nephrolithiasis.  - Refilled few doses of zofran , QTC not prolonged.  Nephrolithiasis Has drain in at this time that is well draining. Flank pain is manageable for patient.  - Follow up with urology  HFrEF (heart failure with reduced ejection fraction) (HCC) Remote history of heart failure with EF 35-40 and global hypokinesis of left ventricle. Patient does not seem to have many symptoms. EF may have improved.  - Ordered updated echo  Transaminitis Alcohol  use (HCC) Improved before leaving hospital. Now that patient has abstained from alcohol  will hopefully continue to improve.  - Can check again in a few months.  Metastatic malignant neoplasm, unspecified site [facility]) Bone biopsy showing metastatic disease.  - Patient to have  follow up with oncology and discuss options.     Areta Saliva, MD [facility] "

## 2023-10-30 ENCOUNTER — Telehealth: Payer: Self-pay

## 2023-10-30 NOTE — Telephone Encounter (Signed)
 Called patient no answer, unable to LVM. Tried calling both numbers on file no one answered. Cassell Mary CMA

## 2023-10-30 NOTE — Assessment & Plan Note (Signed)
 Improved before leaving hospital. Now that patient has abstained from alcohol  will hopefully continue to improve.  - Can check again in a few months.

## 2023-10-30 NOTE — Assessment & Plan Note (Signed)
 Bone biopsy showing metastatic disease.  - Patient to have follow up with oncology and discuss options.

## 2023-10-30 NOTE — Telephone Encounter (Signed)
-----   Message from [facility] Reena S sent at 10/29/2023  4:02 PM EDT ----- Ok to schedule CT at a [facility] facility.  Thanks! Margit

## 2023-10-30 NOTE — Assessment & Plan Note (Signed)
 Has drain in at this time that is well draining. Flank pain is manageable for patient.  - Follow up with urology

## 2023-10-30 NOTE — Assessment & Plan Note (Signed)
 Improving. Re packed wound in clinic and taught roommate how to pack it.  Follow up with urology next week.  Advised patient to take oxycodone  30 mins before dressing changes

## 2023-10-30 NOTE — Telephone Encounter (Signed)
 Mailing imaging appointment reminder. Cassell Mary CMA

## 2023-10-31 LAB — SURGICAL PATHOLOGY

## 2023-11-01 ENCOUNTER — Ambulatory Visit (HOSPITAL_COMMUNITY): Admission: RE | Admit: 2023-11-01 | Source: Ambulatory Visit

## 2023-11-05 ENCOUNTER — Encounter: Payer: Self-pay | Admitting: Urology

## 2023-11-05 ENCOUNTER — Ambulatory Visit: Admitting: Urology

## 2023-11-05 VITALS — BP 112/72 | HR 116 | Ht 72.0 in | Wt 163.0 lb

## 2023-11-05 DIAGNOSIS — N201 Calculus of ureter: Secondary | ICD-10-CM

## 2023-11-05 DIAGNOSIS — N492 Inflammatory disorders of scrotum: Secondary | ICD-10-CM

## 2023-11-05 LAB — URINALYSIS, ROUTINE W REFLEX MICROSCOPIC
Bilirubin, UA: NEGATIVE
Glucose, UA: NEGATIVE
Ketones, UA: NEGATIVE
Nitrite, UA: NEGATIVE
Specific Gravity, UA: 1.01 (ref 1.005–1.030)
Urobilinogen, Ur: 2 mg/dL — ABNORMAL HIGH (ref 0.2–1.0)
pH, UA: 6 (ref 5.0–7.5)

## 2023-11-05 LAB — MICROSCOPIC EXAMINATION: RBC, Urine: >30 /HPF — AB (ref 0–2)

## 2023-11-05 NOTE — Progress Notes (Signed)
 Assessment: 1. Ureteral calculus, right   2. Scrotal abscess     Plan: May discontinue packing of the perineal wound at this time. I discussed management options for the large right ureteral calculus.  I think it be reasonable to see if interventional radiology is able to place a internal stent.  Following stent placement, we could then consider either shockwave lithotripsy or ureteroscopic laser lithotripsy for management. Request placement of right ureteral stent by interventional radiology through existing nephrostomy tube. Return to office after stent placed to discuss additional management of ureteral calculus.  Chief Complaint: Chief Complaint  Patient presents with   Nephrolithiasis    HPI: Larry Houston is a 68 y.o. male who presents for continued evaluation of right ureteral calculus and perineal abscess.  He was seen in the hospital in consultation for a perineal abscess and a right ureteral calculus on 10/23/2023.  He presented to the emergency room on 10/23/2023 with right sided back pain and some scrotal swelling with associated pain x 1 week.  He reported abscess at the base of the scrotum which began draining spontaneously.  CT imaging showed a 12 mm proximal right ureteral calculus with associated obstruction.  His creatinine was normal.  He was treated with IV antibiotics.  He was taken to the operating room on 10/24/2023 for cystoscopy, right retrograde pyelogram, and insertion of a right ureteral stent as well as I&D of the scrotal/perineal abscess. A guidewire was unable to be negotiated past the right proximal ureteral calculus.  The perineal abscess was I&D and packed.  He subsequently underwent placement of a right percutaneous nephrostomy tube by interventional radiology on 10/25/2023.  He was discharged from the hospital on 10/27/2023.    During his hospitalization he was also found to have a 8 cm left breast mass and findings consistent with metastatic disease.  A  biopsy of a bone lesion showed metastatic breast cancer.  He is scheduled to see oncology later this week.  The perineal incision has been healing well and has almost completely closed.  No drainage or pain associated with this.  Portions of the above documentation were copied from a prior visit for review purposes only.  Allergies: Allergies  Allergen Reactions   Penicillins Hives    Has patient had a PCN reaction causing immediate rash, facial/tongue/throat swelling, SOB or lightheadedness with hypotension: No Has patient had a PCN reaction causing severe rash involving mucus membranes or skin necrosis: No Has patient had a PCN reaction that required hospitalization No Has patient had a PCN reaction occurring within the last 10 years: No If all of the above answers are NO, then may proceed with Cephalosporin use.    PMH: Past Medical History:  Diagnosis Date   Alcohol  abuse    Arthritis    lumbar stenosis    Hypertension    Scrotal abscess    Stroke [facility])    Venous malformation 09/03/2016   Right lower extremity venous malformation    PSH: Past Surgical History:  Procedure Laterality Date   CYSTOSCOPY W/ URETERAL STENT PLACEMENT Right 10/24/2023   Procedure: CYSTOSCOPY, WITH RIGHT RETROGRADE PYELOGRAM;  Surgeon: Roseann Adine PARAS., MD;  Location: [facility] OR;  Service: Urology;  Laterality: Right;  POSSIBLE LASER LIPOTRIPSY   IR NEPHROSTOMY PLACEMENT RIGHT  10/25/2023   IR RADIOLOGIST EVAL & MGMT  04/05/2016   IR RADIOLOGIST EVAL & MGMT  10/17/2016   IR RADIOLOGIST EVAL & MGMT  12/05/2016   IR SCLEROTHERAPY OF A FLUID COLLECTION  Right 09/13/2016   Venous malformation [Q27.9]   RLE   IR SCLEROTHERAPY OF A FLUID COLLECTION  09/13/2016   IR US  GUIDE VASC ACCESS RIGHT  09/13/2016   LUMBAR LAMINECTOMY/DECOMPRESSION MICRODISCECTOMY Bilateral 01/26/2015   Procedure: Laminectomy and Foraminotomy - L4-L5 - bilateral;  Surgeon: Alm GORMAN Molt, MD;  Location: [facility] NEURO ORS;  Service:  Neurosurgery;  Laterality: Bilateral;  Laminectomy and Foraminotomy - L4-L5 - bilateral   NASAL ENDOSCOPY WITH EPISTAXIS CONTROL N/A 04/04/2014   Procedure: NASAL ENDOSCOPY WITH EPISTAXIS CONTROL;  Surgeon: Merilee Kraft, MD;  Location: [facility] OR;  Service: ENT;  Laterality: N/A;   NECK SURGERY  1992   posterior fusion- cervical    RADIOLOGY WITH ANESTHESIA N/A 09/13/2016   Procedure: percutaneous sclerotherapy with dehydrated ethanol;  Surgeon: Karalee Beat, MD;  Location[facility] OR;  Service: Radiology;  Laterality: N/A;   SCROTAL EXPLORATION N/A 10/24/2023   Procedure: EXPLORATION, SCROTUM;  Surgeon: Roseann Adine PARAS., MD;  Location: [facility] OR;  Service: Urology;  Laterality: N/A;  IRRIGATION AND DEBRIDEMENT SCROTUM ABSCESS    SH: Social History   Tobacco Use   Smoking status: Every Day    Current packs/day: 0.00    Types: Cigarettes    Start date: 01/14/1999    Last attempt to quit: 01/13/2006    Years since quitting: 17.8   Smokeless tobacco: Never  Vaping Use   Vaping status: Never Used  Substance Use Topics   Alcohol  use: Yes    Comment: occasional    Drug use: No    ROS: Constitutional:  Negative for fever, chills, weight loss CV: Negative for chest pain, previous MI, hypertension Respiratory:  Negative for shortness of breath, wheezing, sleep apnea, frequent cough GI:  Negative for nausea, vomiting, bloody stool, GERD  PE: BP 112/72   Pulse (!) 116   Ht 6' (1.829 m)   Wt 163 lb (73.9 kg)   BMI 22.11 kg/m  GENERAL APPEARANCE:  Well appearing, well developed, well nourished, NAD HEENT:  Atraumatic, normocephalic, oropharynx clear NECK:  Supple without lymphadenopathy or thyromegaly ABDOMEN:  Soft, non-tender, no masses EXTREMITIES:  Moves all extremities well, without clubbing, cyanosis, or edema NEUROLOGIC:  Alert and oriented x 3, in wheelchair, CN II-XII grossly intact MENTAL STATUS:  appropriate BACK:  Non-tender to palpation, No CVAT; right nephrostomy tube  draining blood tinged urine SKIN:  Warm, dry, and intact   Results: Results for orders placed or performed in visit on 11/05/23 (from the past 24 hours)  Microscopic Examination   Collection Time: 11/05/23 12:00 AM   Urine  Result Value Ref Range   WBC, UA 6-10 (A) 0 - 5 /hpf   RBC, Urine >30 (A) 0 - 2 /hpf   Epithelial Cells (non renal) 0-10 0 - 10 /hpf   Casts Present None seen /lpf   Cast Type Hyaline casts N/A   Bacteria, UA Few None seen/Few  Urinalysis, Routine w reflex microscopic   Collection Time: 11/05/23 12:00 AM  Result Value Ref Range   Specific Gravity, UA 1.010 1.005 - 1.030   pH, UA 6.0 5.0 - 7.5   Color, UA Yellow Yellow   Appearance Ur Clear Clear   Leukocytes,UA 1+ (A) Negative   Protein,UA 1+ (A) Negative/Trace   Glucose, UA Negative Negative   Ketones, UA Negative Negative   RBC, UA 3+ (A) Negative   Bilirubin, UA Negative Negative   Urobilinogen, Ur 2.0 (H) 0.2 - 1.0 mg/dL   Nitrite, UA Negative Negative   Microscopic  Examination See below:

## 2023-11-06 ENCOUNTER — Telehealth: Payer: Self-pay

## 2023-11-06 NOTE — Telephone Encounter (Signed)
 Left message for patient about upcoming appointment on 10/30

## 2023-11-07 ENCOUNTER — Inpatient Hospital Stay: Attending: Hematology and Oncology | Admitting: Hematology and Oncology

## 2023-11-07 ENCOUNTER — Other Ambulatory Visit (HOSPITAL_COMMUNITY): Payer: Self-pay

## 2023-11-07 VITALS — BP 119/66 | HR 120 | Temp 98.3°F | Resp 16 | Wt 167.7 lb

## 2023-11-07 DIAGNOSIS — Z1721 Progesterone receptor positive status: Secondary | ICD-10-CM | POA: Insufficient documentation

## 2023-11-07 DIAGNOSIS — F17210 Nicotine dependence, cigarettes, uncomplicated: Secondary | ICD-10-CM | POA: Insufficient documentation

## 2023-11-07 DIAGNOSIS — C7951 Secondary malignant neoplasm of bone: Secondary | ICD-10-CM | POA: Insufficient documentation

## 2023-11-07 DIAGNOSIS — Z17411 Hormone receptor positive with human epidermal growth factor receptor 2 negative status: Secondary | ICD-10-CM | POA: Insufficient documentation

## 2023-11-07 DIAGNOSIS — C50912 Malignant neoplasm of unspecified site of left female breast: Secondary | ICD-10-CM

## 2023-11-07 DIAGNOSIS — Z170 Estrogen receptor positive status [ER+]: Secondary | ICD-10-CM | POA: Diagnosis not present

## 2023-11-07 DIAGNOSIS — C50922 Malignant neoplasm of unspecified site of left male breast: Secondary | ICD-10-CM | POA: Insufficient documentation

## 2023-11-07 DIAGNOSIS — Z79810 Long term (current) use of selective estrogen receptor modulators (SERMs): Secondary | ICD-10-CM | POA: Diagnosis not present

## 2023-11-07 MED ORDER — TAMOXIFEN CITRATE 20 MG PO TABS
20.0000 mg | ORAL_TABLET | Freq: Every day | ORAL | 3 refills | Status: DC
  Filled 2023-11-07: qty 90, 90d supply, fill #0

## 2023-11-07 NOTE — Progress Notes (Signed)
 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 Discussed potential side effects: hot flashes, blood clots - Deferred Ibrance until post-procedures due to neutropenia risk. - Plan bone strengthener therapy; ensure dental evaluation due to osteonecrosis risk.  Alcohol  use disorder Recent reduction in intake noted. - Encouraged continued reduction in alcohol  consumption. - Monitored nutritional intake; recommended nutritional supplements like Ensure.  Weakness Attributed to recent medical events, unlikely just cancer-related. - Monitored recovery and nutritional status.   No orders of the defined types were placed in this encounter.    HISTORY OF PRESENTING ILLNESS:  Larry Houston 68 y.o. male is here because of metastatic male breast cancer  Oncology History  Breast cancer metastasized to bone (HCC)  10/23/2023 Initial Diagnosis   Breast cancer metastasized to bone (HCC)   10/23/2023 Imaging   IMPRESSION: 1. No pulmonary embolism. 2. 8.3 cm left breast mass with overlying cutaneous thickening, suspicious for male breast cancer. 3. Left axillary, left subpectoral, and subcarinal nodal metastases. 4. Subpleural pulmonary nodularity, suspicious for metastases in this clinical setting. 5. Diffuse osseous metastases throughout the visualized axial and appendicular skeleton. 6. Trace bilateral pleural effusions.     10/25/2023 Pathology Results   BONE, LEFT ILIAC, BIOPSY:        Metastatic carcinoma.       See comment.  ADDENDUM:  Immunohistochemical stains were performed to characterize the tumor cells. The cells are strongly and diffusely positive for GATA3. The cells are negative for SOX10, NKX3.1, CDX2, PAX8, and TTF-1. The findings are in keeping with metastatic carcinoma of breast primary. Breast prognostic markers (ER, PR, Her2) will be performed and reported in an addendum.   IMMUNOHISTOCHEMICAL AND MORPHOMETRIC ANALYSIS PERFORMED MANUALLY   The tumor cells are negative for Her2 (0).   Estrogen Receptor:  90%, positive, moderate to strong staining intensity   Progesterone Receptor:  50%, positive, moderate to strong staining  intensity    11/09/2023 Cancer Staging   Staging form: Breast, AJCC 8th Edition - Clinical stage from 11/09/2023: Stage IV (cTX, cNX, cM1, GX, ER+, PR+, HER2-) - Signed by Loretha Ash, MD on 11/09/2023 Stage prefix: Initial diagnosis Histologic grading system: 3 grade system    Discussed the use of AI scribe software for clinical note transcription with the patient, who gave verbal consent to proceed.  History of Present Illness Larry Houston is a 68 year old male with metastatic breast cancer who presents for management of his condition.  The mass has been present for a long time, several months to yrs. I happen to see him for the first time while he was hospitalized.  He recently completed treatment for a scrotal abscess and is no longer on antibiotics. The wound is healing, and he no longer requires packing. No severe pain, fevers, or chills related to the abscess.  He has a history of kidney stones and recently underwent a procedure involving a drain tube for the kidney. He reports pain at the site of the drain tube. A retrograde pyelogram and cystoscopy were performed, and a right ureteral  stent was placed. The removal date for the stent is not yet determined.  He experiences weakness, which he attributes to  recent medical events including the scrotal abscess, kidney stone, and related procedures. He has also experienced weight loss, which he attributes to decreased appetite likely from alcohol  consumption as well as the recent hospitalization. He is working on improving his nutrition and has been using Ensure to help with calorie intake.  He has been reducing his alcohol  intake, with his last drink being a small amount last week. He is also a smoker but has reduced his smoking due to lack of energy to make cigarettes himself.  All other systems were reviewed with the patient and are negative.  MEDICAL HISTORY:  Past Medical History:  Diagnosis Date   Alcohol  abuse    Arthritis    lumbar stenosis    Hypertension    Scrotal abscess    Stroke [facility])    Venous malformation 09/03/2016   Right lower extremity venous malformation    SURGICAL HISTORY: Past Surgical History:  Procedure Laterality Date   CYSTOSCOPY W/ URETERAL STENT PLACEMENT Right 10/24/2023   Procedure: CYSTOSCOPY, WITH RIGHT RETROGRADE PYELOGRAM;  Surgeon: Roseann Adine PARAS., MD;  Location: [facility] OR;  Service: Urology;  Laterality: Right;  POSSIBLE LASER LIPOTRIPSY   IR NEPHROSTOMY PLACEMENT RIGHT  10/25/2023   IR RADIOLOGIST EVAL & MGMT  04/05/2016   IR RADIOLOGIST EVAL & MGMT  10/17/2016   IR RADIOLOGIST EVAL & MGMT  12/05/2016   IR SCLEROTHERAPY OF A FLUID COLLECTION Right 09/13/2016   Venous malformation [Q27.9]   RLE   IR SCLEROTHERAPY OF A FLUID COLLECTION  09/13/2016   IR US  GUIDE VASC ACCESS RIGHT  09/13/2016   LUMBAR LAMINECTOMY/DECOMPRESSION MICRODISCECTOMY Bilateral 01/26/2015   Procedure: Laminectomy and Foraminotomy - L4-L5 - bilateral;  Surgeon: Alm GORMAN Molt, MD;  Location: [facility] NEURO ORS;  Service: Neurosurgery;  Laterality: Bilateral;  Laminectomy and Foraminotomy - L4-L5 - bilateral   NASAL ENDOSCOPY WITH EPISTAXIS CONTROL N/A 04/04/2014   Procedure: NASAL ENDOSCOPY WITH EPISTAXIS CONTROL;  Surgeon: Merilee Kraft,  MD;  Location: [facility] OR;  Service: ENT;  Laterality: N/A;   NECK SURGERY  1992   posterior fusion- cervical    RADIOLOGY WITH ANESTHESIA N/A 09/13/2016   Procedure: percutaneous sclerotherapy with dehydrated ethanol;  Surgeon: Karalee Beat, MD;  Location: [facility] OR;  Service: Radiology;  Laterality: N/A;   SCROTAL EXPLORATION N/A 10/24/2023   Procedure: EXPLORATION, SCROTUM;  Surgeon: Roseann Adine PARAS., MD;  Location: [facility] OR;  Service: Urology;  Laterality: N/A;  IRRIGATION AND DEBRIDEMENT SCROTUM ABSCESS    SOCIAL HISTORY: Social History   Socioeconomic History   Marital status: Divorced    Spouse name: Not on file   Number of children: Not on file   Years of education: Not on file   Highest education level: Not on file  Occupational History   Not on file  Tobacco Use   Smoking status: Every Day    Current packs/day: 0.00    Types: Cigarettes    Start date: 01/14/1999    Last attempt to quit: 01/13/2006    Years since quitting: 17.8   Smokeless tobacco: Never  Vaping Use   Vaping status: Never Used  Substance and Sexual Activity   Alcohol  use: Yes    Comment: occasional    Drug use: No   Sexual activity: Not on file  Other Topics Concern   Not on file  Social History Narrative   Not on  file   Social Drivers of Health   Financial Resource Strain: Not on file  Food Insecurity: No Food Insecurity (10/23/2023)   Hunger Vital Sign    Worried About Running Out of Food in the Last Year: Never true    Ran Out of Food in the Last Year: Never true  Transportation Needs: No Transportation Needs (10/23/2023)   PRAPARE - Administrator, Civil Service (Medical): No    Lack of Transportation (Non-Medical): No  Physical Activity: Not on file  Stress: Not on file  Social Connections: Socially Isolated (10/23/2023)   Social Connection and Isolation Panel    Frequency of Communication with Friends and Family: Once a week    Frequency of Social Gatherings with Friends and  Family: Never    Attends Religious Services: Never    Database Administrator or Organizations: Yes    Attends Banker Meetings: Never    Marital Status: Divorced  Catering Manager Violence: Not At Risk (10/23/2023)   Humiliation, Afraid, Rape, and Kick questionnaire    Fear of Current or Ex-Partner: No    Emotionally Abused: No    Physically Abused: No    Sexually Abused: No    FAMILY HISTORY: Family History  Problem Relation Age of Onset   Diabetes Mother    Hypertension Mother    Anemia Mother        Died of aplastic anemia   Lung disease Father    Heart attack Brother    Heart disease Sister        One sister had heart arrest    ALLERGIES:  is allergic to penicillins.  MEDICATIONS:  Current Outpatient Medications  Medication Sig Dispense Refill   tamoxifen (NOLVADEX) 20 MG tablet Take 1 tablet (20 mg total) by mouth daily. 90 tablet 3   acetaminophen  (TYLENOL ) 325 MG tablet Take 2 tablets (650 mg total) by mouth every 6 (six) hours as needed for mild pain (pain score 1-3).     amLODipine  (NORVASC ) 5 MG tablet Take 1 tablet (5 mg total) by mouth daily before breakfast. 30 tablet 1   aspirin  EC 81 MG tablet Take 1 tablet (81 mg total) by mouth daily. Swallow whole. 90 tablet 3   atorvastatin  (LIPITOR ) 80 MG tablet Take 1 tablet (80 mg total) by mouth daily. 30 tablet 0   [Paused] clindamycin  (CLEOCIN  T) 1 % external solution Apply 1 Application topically 2 (two) times daily. (Patient not taking: Reported on 11/05/2023) 30 mL 0   feeding supplement (ENSURE PLUS HIGH PROTEIN) LIQD Take 237 mLs by mouth 2 (two) times daily between meals.     folic acid  (FOLVITE ) 1 MG tablet Take 1 tablet (1 mg total) by mouth daily. (Patient not taking: Reported on 11/05/2023)     gabapentin  (NEURONTIN ) 300 MG capsule Take 1 capsule (300 mg total) by mouth 2 (two) times daily. 60 capsule 0   Multiple Vitamin (MULTIVITAMIN WITH MINERALS) TABS tablet Take 1 tablet by mouth daily.  (Patient not taking: Reported on 11/05/2023)     nicotine (NICODERM CQ - DOSED IN MG/24 HOURS) 14 mg/24hr patch Place 1 patch (14 mg total) onto the skin daily. 28 patch 0   ondansetron  (ZOFRAN -ODT) 4 MG disintegrating tablet Take 1 tablet (4 mg total) by mouth every 8 (eight) hours as needed for nausea or vomiting. 15 tablet 0   polyethylene glycol (MIRALAX / GLYCOLAX) 17 g packet Take 17 g by mouth daily as needed.  senna (SENOKOT) 8.6 MG TABS tablet Take 1 tablet (8.6 mg total) by mouth daily as needed for mild constipation.     thiamine  (VITAMIN B-1) 100 MG tablet Take 1 tablet (100 mg total) by mouth daily. (Patient not taking: Reported on 11/05/2023)     No current facility-administered medications for this visit.     PHYSICAL EXAMINATION: ECOG PERFORMANCE STATUS: 2 - Symptomatic, <50% confined to bed  Vitals:   11/07/23 1436  BP: 119/66  Pulse: (!) 120  Resp: 16  Temp: 98.3 F (36.8 C)  SpO2: 98%   Filed Weights   11/07/23 1436  Weight: 167 lb 11.2 oz (76.1 kg)    GENERAL:alert, no distress and comfortable Large breast mass occupying the entire right breast Palpable left axillary adenopathy Rest of the exam deferred  LABORATORY DATA:  I have reviewed the data as listed Lab Results  Component Value Date   WBC 9.5 10/27/2023   HGB 9.6 (L) 10/27/2023   HCT 29.4 (L) 10/27/2023   MCV 94.2 10/27/2023   PLT 118 (L) 10/27/2023     Chemistry      Component Value Date/Time   NA 140 10/27/2023 0827   K 3.8 10/27/2023 0827   CL 106 10/27/2023 0827   CO2 22 10/27/2023 0827   BUN 23 10/27/2023 0827   CREATININE 1.37 (H) 10/27/2023 0827      Component Value Date/Time   CALCIUM  9.2 10/27/2023 0827   ALKPHOS 508 (H) 10/25/2023 0311   AST 245 (H) 10/25/2023 0311   ALT 211 (H) 10/25/2023 0311   BILITOT 1.4 (H) 10/25/2023 0311       RADIOGRAPHIC STUDIES: I have personally reviewed the radiological images as listed and agreed with the findings in the report. IR  NEPHROSTOMY PLACEMENT RIGHT Result Date: 10/28/2023 INDICATION: Right-sided hydronephrosis and nephrolithiasis EXAM: Percutaneous nephrostomy tube placement COMPARISON:  None Available. MEDICATIONS: 4 mg Zofran ; The antibiotic was administered in an appropriate time frame prior to skin puncture. ANESTHESIA/SEDATION: Moderate (conscious) sedation was employed during this procedure. A total of Versed  2 mg and Fentanyl  100 mcg was administered intravenously by the radiology nurse. Total intra-service moderate Sedation Time: 90 minutes. The patient's level of consciousness and vital signs were monitored continuously by radiology nursing throughout the procedure under my direct supervision. CONTRAST:  50 mL Omnipaque  300-administered into the collecting system(s) FLUOROSCOPY: Radiation Exposure Index (as provided by the fluoroscopic device): 518 mGy Kerma COMPLICATIONS: None immediate. PROCEDURE: Informed written consent was obtained from the patient after a thorough discussion of the procedural risks, benefits and alternatives. All questions were addressed. Maximal Sterile Barrier Technique was utilized including caps, mask, sterile gowns, sterile gloves, sterile drape, hand hygiene and skin antiseptic. A timeout was performed prior to the initiation of the procedure. In a prone position, the right flank region was prepped and draped in the usual sterile fashion. Ultrasound was used to evaluate the kidney which showed no hydronephrosis at the time of the intervention. Local anesthesia was achieved with 1 percent lidocaine  by infiltrating subcutaneous tissue under ultrasound guidance. 1 percent lidocaine  was injected from the skin to the renal capsule. A small incision was made in the right flank region. A Chiba needle was then advanced under combination of ultrasound and fluoroscopic guidance. Initial access to a renal calyx could not be maintained, however contrast was injected filling the calices and demonstrating  the renal stone at the ureteropelvic junction. Multiple attempts were then made to regain access to the renal collecting system. Finally a  lower pole calyx was accessed with a needle and a guidewire was advanced into the renal pelvis. Access was exchanged for an access sheath. The sheath was advanced to the renal pelvis. Introducer and dilator were then removed. Contrast was again injected demonstrating contrast filling the renal pelvis. A guidewire was then advanced beyond the stone. Access was then exchanged for an 035 guidewire and a 10 French pigtail catheter was advanced over the guidewire. In the process of gaining access to the renal collecting system, the renal pelvis was perforated with the guidewire and a small amount of contrast is identified extrarenal. The final placement of the catheter is within the pelvis. Sterile dressing and retention suture applied. Locking collar engaged. IMPRESSION: Satisfactory placement of a right-sided percutaneous nephrostomy tube as described above. Electronically Signed   By: Cordella Banner   On: 10/28/2023 08:16   CT BONE TROCAR/NEEDLE BIOPSY DEEP Result Date: 10/25/2023 INDICATION: Multifocal sclerotic lesions throughout the axial skeleton. EXAM: CT-guided bone lesion biopsy TECHNIQUE: Multidetector CT imaging of the pelvis was performed following the standard protocol without IV contrast. RADIATION DOSE REDUCTION: This exam was performed according to the departmental dose-optimization program which includes automated exposure control, adjustment of the mA and/or kV according to patient size and/or use of iterative reconstruction technique. MEDICATIONS: None. ANESTHESIA/SEDATION: Moderate (conscious) sedation was employed during this procedure. A total of Versed  mg and Fentanyl  mcg was administered intravenously by the radiology nurse. Total intra-service moderate Sedation Time: minutes. The patient's level of consciousness and vital signs were monitored  continuously by radiology nursing throughout the procedure under my direct supervision. COMPLICATIONS: None immediate. PROCEDURE: Informed written consent was obtained from the patient after a thorough discussion of the procedural risks, benefits and alternatives. All questions were addressed. Maximal Sterile Barrier Technique was utilized including caps, mask, sterile gowns, sterile gloves, sterile drape, hand hygiene and skin antiseptic. A timeout was performed prior to the initiation of the procedure. The patient was placed in a prone position on the CT gantry. Initial axial images of the pelvis were obtained. The more sclerotic region of the left iliac bone was selected as the target. Radiopaque markers were placed on patient's skin. Repeat imaging was performed. Measurements for access were applied. Patient's skin was then prepped, draped, and anesthetized in the usual sterile fashion with 1% lidocaine . 13 gauge biopsy needle was then advanced through a small incision to the left iliac bone. A total of 2 samples were then obtained once the sclerotic region was engaged. The initial sclerotic focus could not be retrieved from the needle. A second sample was obtained which was easily retrieved and sent to pathology. Sterile dressing applied. No immediate complications. IMPRESSION: Satisfactory bone biopsy of a sclerotic region in the left iliac bone. Electronically Signed   By: Cordella Banner   On: 10/25/2023 17:02   DG C-Arm 1-60 Min Result Date: 10/25/2023 CLINICAL DATA:  Right UPJ calculus with hydronephrosis. EXAM: DG C-ARM 1-60 MIN CONTRAST:  Please see operative note for detail FLUOROSCOPY: Radiation exposure index: 27.37 mGy, air kerma COMPARISON:  CT scan of the abdomen and pelvis 10/22/2023 FINDINGS: A total of 8 intraoperative saved images are submitted for review. Initial spot imaging demonstrates a radiopacity at the L3 level consistent with the known stone. Subsequent images document cannulation  of the right ureteral orifice and retrograde ureteral pyelogram. There is mild hydronephrosis. The filling defect persists at the ureteropelvic junction. Some additional irregularity is present at the termination of the study likely representing either introduced  bubbles or a small amount of thrombus formation. IMPRESSION: 1. At least partially obstructing right UPJ stone with associated mild to moderate hydronephrosis. 2. Right retrograde ureterogram. Electronically Signed   By: Wilkie Lent M.D.   On: 10/25/2023 10:58   US  SCROTUM W/DOPPLER Result Date: 10/23/2023 EXAM: ULTRASOUND SCROTUM/TESTICLES WITH DOPPLER FLOW EVALUATION 10/23/2023 02:13:05 PM TECHNIQUE: Duplex ultrasound using B-mode/gray scaled imaging, Doppler spectral analysis and color flow Doppler was obtained of the testicles. COMPARISON: None available. CLINICAL HISTORY: Abscess. FINDINGS: RIGHT: MEASUREMENTS: The right testicle measures 3.9 x 1.9 x 2.3 cm. GREY SCALE: The right testicle demonstrates normal homogeneous echotexture without focal lesion. No testicular microlithiasis. DOPPLER EVALUATION: There is normal arterial and venous Doppler flow within the testicle. VARICOCELE: Right varicocele noted, 3.1 mm in diameter. SCROTAL SAC: No hydrocele. EPIDIDYMIS: No acute abnormality. LEFT: MEASUREMENTS: The left testicle measures 3.5 x 2.0 x 2.1 cm. GREY SCALE: The left testicle demonstrates normal homogeneous echotexture without focal lesion. No testicular microlithiasis. DOPPLER EVALUATION: There is normal arterial and venous Doppler flow within the testicle. VARICOCELE: No scrotal varicocele. SCROTAL SAC: No hydrocele. EPIDIDYMIS: 1.1 x 1.0 x 1.0 cm left epididymal cystic lesion potentially with some minimal layering complexity but no internal blood flow noted. Likely a minimally complex epididymal cyst or spermatocele. Appearance is not characteristic of epididymal abscess. IMPRESSION: 1. No evidence of  abscess. 2. Right varicocele,  3.1 mm in diameter. 3. Left epididymal cystic lesion (likely minimally complex epididymal cyst or spermatocele), 1.1 x 1.0 x 1.0 cm. 4. Normal testicular arterial and venous flow bilaterally. Electronically signed by: Ryan Salvage MD 10/23/2023 02:40 PM EDT RP Workstation: [id number]   CT Angio Chest PE W and/or Wo Contrast Result Date: 10/23/2023 EXAM: CTA of the Chest with contrast for PE 10/23/2023 12:28:06 AM TECHNIQUE: CTA of the chest was performed without and with the administration of 60 mL of intravenous contrast (iohexol  (OMNIPAQUE ) 350 MG/ML injection 75 mL IOHEXOL  350 MG/ML SOLN). Multiplanar reformatted images are provided for review. MIP images are provided for review. Automated exposure control, iterative reconstruction, and/or weight based adjustment of the mA/kV was utilized to reduce the radiation dose to as low as reasonably achievable. COMPARISON: None available. CLINICAL HISTORY: Pulmonary embolism (PE) suspected, high prob. F/u from CT abdomen/pelvis w/ suspicious findings; Abscess; 60 ml omni 350. FINDINGS: PULMONARY ARTERIES: Pulmonary arteries are adequately opacified for evaluation. No pulmonary embolism. Main pulmonary artery is normal in caliber. MEDIASTINUM: The heart and pericardium demonstrate no acute abnormality. There is no acute abnormality of the thoracic aorta. LYMPH NODES: Left axillary nodal metastases measuring up to 2.0 cm (image 42). 13 mm short axis left subpectoral nodal metastasis (image 32). 13 mm short axis subcarinal node (image 69). LUNGS AND PLEURA: Mild interlobar septal thickening with subpleural nodularity in the lungs bilaterally, measuring up to 4 mm in the lingula (image 90). This is nonspecific but suspicious for perilymphatic tumor spread in this clinical setting. Trace bilateral pleural effusions, left greater than right. No pneumothorax. UPPER ABDOMEN: Limited images of the upper abdomen are unremarkable. SOFT TISSUES AND BONES: 8.3 x 3.8 cm left  breast mass with overlying cutaneous thickening (image 81), suspicious for male breast cancer. Diffuse mixed sclerotic metastases throughout the visualized axial and appendicular skeleton. IMPRESSION: 1. No pulmonary embolism. 2. 8.3 cm left breast mass with overlying cutaneous thickening, suspicious for male breast cancer. 3. Left axillary, left subpectoral, and subcarinal nodal metastases. 4. Subpleural pulmonary nodularity, suspicious for metastases in this clinical setting. 5. Diffuse osseous metastases  throughout the visualized axial and appendicular skeleton. 6. Trace bilateral pleural effusions. Electronically signed by: Pinkie Pebbles MD 10/23/2023 12:36 AM EDT RP Workstation: [id number]   CT ABDOMEN PELVIS W CONTRAST Result Date: 10/22/2023 EXAM: CT ABDOMEN AND PELVIS WITH CONTRAST 10/22/2023 11:38:08 PM TECHNIQUE: CT of the abdomen and pelvis was performed with the administration of 75 mL of intravenous contrast (iohexol  (OMNIPAQUE ) 350 MG/ML injection 75 mL IOHEXOL  350 MG/ML SOLN). Multiplanar reformatted images are provided for review. Automated exposure control, iterative reconstruction, and/or weight-based adjustment of the mA/kV was utilized to reduce the radiation dose to as low as reasonably achievable. COMPARISON: None available. CLINICAL HISTORY: Concern for scrotal abscess, elevated LFT's. Pt complains of scrotal perineal abscess seen at urgent care sent in for further evaluation. FINDINGS: LOWER CHEST: Subpleural micronodularity in the lungs bilaterally, including a dominant 4 mm lingular nodule (image 1), indeterminate. Trace left pleural effusion. LIVER: Subcentimeter cyst in the left hepatic lobe (image 18), benign. GALLBLADDER AND BILE DUCTS: Gallbladder is unremarkable. No biliary ductal dilatation. SPLEEN: No acute abnormality. PANCREAS: No acute abnormality. ADRENAL GLANDS: No acute abnormality. KIDNEYS, URETERS AND BLADDER: Moderate right hydronephrosis with associated 12 mm  proximal right ureteral calculus at the L3-L4 level. No stones in the left kidney or left ureter. No perinephric or periureteral stranding. Mildly thick-walled bladder, although underdistended. GI AND BOWEL: Stomach demonstrates no acute abnormality. Normal appendix (image 52). There is no bowel obstruction. PERITONEUM AND RETROPERITONEUM: No ascites. No free air. VASCULATURE: Aorta is normal in caliber. Atherosclerotic calcifications of the abdominal aorta and branch vessels, although patent. LYMPH NODES: No lymphadenopathy. REPRODUCTIVE ORGANS: Prostatomegaly. Mild scrotal wall thickening/edema. No discrete abscess on CT. BONES AND SOFT TISSUES: Tiny fat-containing bilateral inguinal hernias. Diffuse sclerotic metastases throughout the visualized axial and appendicular skeleton. IMPRESSION: 1. Moderate right hydronephrosis with associated 12 mm proximal right ureteral calculus at the L3-L4 level. 2. Mild scrotal wall thickening/edema without discrete abscess on CT. 3. Diffuse sclerotic metastases throughout the visualized axial and appendicular skeleton. Correlate for history of malignancy, presumably prostate CA. 4. Subpleural micronodularity measuring up to 4 mm in the lingula. In the setting of suspected primary malignancy, Fleischner Society guidelines do not apply. Consider follow-up CT chest as clinically warranted. Electronically signed by: Pinkie Pebbles MD 10/22/2023 11:52 PM EDT RP Workstation: [id number]   DG Thoracic Spine W/Swimmers Result Date: 10/22/2023 CLINICAL DATA:  Back pain EXAM: THORACIC SPINE - 3 VIEWS COMPARISON:  None Available. FINDINGS: Questionable mottled appearance and sclerosis throughout the thoracic spine. No fracture or malalignment. Disc spaces are maintained. Anterior and lateral spurring. IMPRESSION: Questionable mottled/sclerotic appearance of the thoracic spine. Consider further evaluation with CT to exclude sclerotic lesions/metastases. Electronically Signed   By:  Franky Crease M.D.   On: 10/22/2023 01:03   DG Lumbar Spine Complete Result Date: 10/22/2023 CLINICAL DATA:  Back pain EXAM: LUMBAR SPINE - COMPLETE 4+ VIEW COMPARISON:  None Available. FINDINGS: Degenerative facet disease, most pronounced at L4-5 and L5-S1. 4 mm anterolisthesis of L4 on L5. Disc spaces are maintained. No fracture. IMPRESSION: Degenerative facet disease in the lower lumbar spine. No acute bony abnormality. Electronically Signed   By: Franky Crease M.D.   On: 10/22/2023 00:58    All questions were answered. The patient knows to call the clinic with any problems, questions or concerns. I spent 45 minutes in the care of this patient including H and P, review of records, counseling and coordination of care.     Amber Stalls, MD 11/09/2023 12:09 PM

## 2023-11-09 ENCOUNTER — Encounter: Payer: Self-pay | Admitting: Hematology and Oncology

## 2023-11-09 NOTE — Assessment & Plan Note (Signed)
 Assessment and Plan Assessment & Plan Metastatic breast cancer with bone and lymph node involvement Stage IV, ER/PR positive, her 2 neg - Initiated tamoxifen therapy. Discussed potential side effects: hot flashes, blood clots - Deferred Ibrance until post-procedures due to neutropenia risk. - Plan bone strengthener therapy; ensure dental evaluation due to osteonecrosis risk.  Alcohol  use disorder Recent reduction in intake noted. - Encouraged continued reduction in alcohol  consumption. - Monitored nutritional intake; recommended nutritional supplements like Ensure.  Weakness Attributed to recent medical events, unlikely just cancer-related. - Monitored recovery and nutritional status.

## 2023-11-12 ENCOUNTER — Telehealth (HOSPITAL_COMMUNITY): Payer: Self-pay | Admitting: Radiology

## 2023-11-12 NOTE — Telephone Encounter (Signed)
 Called pt to schedule Rt ureteral stent via PCN. Left VM for pt to call back. JM

## 2023-11-14 ENCOUNTER — Other Ambulatory Visit: Payer: Self-pay

## 2023-11-14 ENCOUNTER — Ambulatory Visit (HOSPITAL_COMMUNITY)
Admission: RE | Admit: 2023-11-14 | Discharge: 2023-11-14 | Disposition: A | Discharge status: Home / Self Care | Source: Ambulatory Visit | Attending: Urology | Admitting: Urology

## 2023-11-14 ENCOUNTER — Other Ambulatory Visit: Payer: Self-pay | Admitting: Urology

## 2023-11-14 DIAGNOSIS — C50929 Malignant neoplasm of unspecified site of unspecified male breast: Secondary | ICD-10-CM | POA: Insufficient documentation

## 2023-11-14 DIAGNOSIS — Z79899 Other long term (current) drug therapy: Secondary | ICD-10-CM | POA: Diagnosis not present

## 2023-11-14 DIAGNOSIS — N201 Calculus of ureter: Secondary | ICD-10-CM

## 2023-11-14 DIAGNOSIS — Z436 Encounter for attention to other artificial openings of urinary tract: Secondary | ICD-10-CM | POA: Insufficient documentation

## 2023-11-14 DIAGNOSIS — Z87891 Personal history of nicotine dependence: Secondary | ICD-10-CM | POA: Diagnosis not present

## 2023-11-14 DIAGNOSIS — I10 Essential (primary) hypertension: Secondary | ICD-10-CM | POA: Insufficient documentation

## 2023-11-14 DIAGNOSIS — N132 Hydronephrosis with renal and ureteral calculous obstruction: Secondary | ICD-10-CM | POA: Diagnosis not present

## 2023-11-14 DIAGNOSIS — C7951 Secondary malignant neoplasm of bone: Secondary | ICD-10-CM | POA: Insufficient documentation

## 2023-11-14 HISTORY — PX: IR URETERAL STENT PLACEMENT EXISTING ACCESS RIGHT: IMG6074

## 2023-11-14 HISTORY — PX: IR NEPHROSTOMY EXCHANGE RIGHT: IMG6070

## 2023-11-14 HISTORY — PX: IR DIL URETER RIGHT: IMG2265

## 2023-11-14 LAB — CBC
HCT: 25.9 % — ABNORMAL LOW (ref 39.0–52.0)
Hemoglobin: 8.2 g/dL — ABNORMAL LOW (ref 13.0–17.0)
MCH: 31.1 pg (ref 26.0–34.0)
MCHC: 31.7 g/dL (ref 30.0–36.0)
MCV: 98.1 fL (ref 80.0–100.0)
Platelets: 188 K/uL (ref 150–400)
RBC: 2.64 MIL/uL — ABNORMAL LOW (ref 4.22–5.81)
RDW: 22.5 % — ABNORMAL HIGH (ref 11.5–15.5)
WBC: 5.2 K/uL (ref 4.0–10.5)
nRBC: 2.1 % — ABNORMAL HIGH (ref 0.0–0.2)

## 2023-11-14 LAB — BASIC METABOLIC PANEL WITH GFR
Anion gap: 13 (ref 5–15)
BUN: 20 mg/dL (ref 8–23)
CO2: 24 mmol/L (ref 22–32)
Calcium: 9.2 mg/dL (ref 8.9–10.3)
Chloride: 97 mmol/L — ABNORMAL LOW (ref 98–111)
Creatinine, Ser: 1.32 mg/dL — ABNORMAL HIGH (ref 0.61–1.24)
GFR, Estimated: 59 mL/min — ABNORMAL LOW (ref >60)
Glucose, Bld: 120 mg/dL — ABNORMAL HIGH (ref 70–99)
Potassium: 4.3 mmol/L (ref 3.5–5.1)
Sodium: 134 mmol/L — ABNORMAL LOW (ref 135–145)

## 2023-11-14 LAB — PROTIME-INR
INR: 1 (ref 0.8–1.2)
Prothrombin Time: 14.3 s (ref 11.4–15.2)

## 2023-11-14 MED ORDER — SODIUM CHLORIDE 0.9 % IV SOLN
INTRAVENOUS | Status: AC
  Filled 2023-11-14: qty 20

## 2023-11-14 MED ORDER — FENTANYL CITRATE (PF) 100 MCG/2ML IJ SOLN
INTRAMUSCULAR | Status: AC | PRN
  Administered 2023-11-14 (×8): 25 ug via INTRAVENOUS

## 2023-11-14 MED ORDER — VANCOMYCIN HCL IN DEXTROSE 1-5 GM/200ML-% IV SOLN
INTRAVENOUS | Status: AC
Start: 2023-11-14 — End: 2023-11-14
  Filled 2023-11-14: qty 200

## 2023-11-14 MED ORDER — FENTANYL CITRATE (PF) 100 MCG/2ML IJ SOLN
INTRAMUSCULAR | Status: AC
  Filled 2023-11-14: qty 2

## 2023-11-14 MED ORDER — MIDAZOLAM HCL 2 MG/2ML IJ SOLN
INTRAMUSCULAR | Status: AC
  Filled 2023-11-14: qty 2

## 2023-11-14 MED ORDER — MIDAZOLAM HCL 2 MG/2ML IJ SOLN
INTRAMUSCULAR | Status: AC
Start: 2023-11-14 — End: 2023-11-14
  Filled 2023-11-14: qty 2

## 2023-11-14 MED ORDER — IOHEXOL 300 MG/ML  SOLN
50.0000 mL | Freq: Once | INTRAMUSCULAR | Status: AC | PRN
  Administered 2023-11-14: 35 mL

## 2023-11-14 MED ORDER — SODIUM CHLORIDE 0.9 % IV SOLN
INTRAVENOUS | Status: AC | PRN
  Administered 2023-11-14: 2 g via INTRAVENOUS

## 2023-11-14 MED ORDER — IOHEXOL 300 MG/ML  SOLN
50.0000 mL | Freq: Once | INTRAMUSCULAR | Status: AC | PRN
  Administered 2023-11-14: 5 mL

## 2023-11-14 MED ORDER — SODIUM CHLORIDE 0.9 % IV SOLN: 2.0000 g | INTRAVENOUS | Status: DC

## 2023-11-14 MED ORDER — MIDAZOLAM HCL (PF) 2 MG/2ML IJ SOLN
INTRAMUSCULAR | Status: AC | PRN
  Administered 2023-11-14 (×5): .5 mg via INTRAVENOUS
  Administered 2023-11-14: 1 mg via INTRAVENOUS
  Administered 2023-11-14 (×2): .5 mg via INTRAVENOUS
  Administered 2023-11-14: 1 mg via INTRAVENOUS
  Administered 2023-11-14: .5 mg via INTRAVENOUS

## 2023-11-14 MED ORDER — LIDOCAINE-EPINEPHRINE 1 %-1:100000 IJ SOLN
INTRAMUSCULAR | Status: AC
  Filled 2023-11-14: qty 1

## 2023-11-14 MED ORDER — ONDANSETRON HCL 4 MG/2ML IJ SOLN
4.0000 mg | Freq: Once | INTRAMUSCULAR | Status: AC
  Administered 2023-11-14: 4 mg via INTRAVENOUS
  Filled 2023-11-14: qty 2

## 2023-11-14 MED ORDER — LIDOCAINE-EPINEPHRINE 1 %-1:100000 IJ SOLN
20.0000 mL | Freq: Once | INTRAMUSCULAR | Status: AC
  Administered 2023-11-14: 10 mL

## 2023-11-14 NOTE — H&P (Addendum)
 Chief Complaint: Patient was seen in consultation today for right ureteral calculus/hydronephrosis.   Referring Physician(s): Stoneking,Bradley J.  Supervising Physician: Dr. Philip   Patient Status: [facility] - Out-pt  History of Present Illness: Larry Houston is a 68 y.o. male with a medical history significant for HTN, lumbar stenosis, tobacco/alcohol  use, peritoneal abscess, right ureteral calculi and recently diagnosed breast cancer. He first presented to the ED 10/22/23 for evaluation of a painful, swollen area in his buttocks region. Imaging revealed several concerning findings - right ureteral calculus with right hydronephrosis; scrotal wall thickening/edema; and diffuse sclerotic metastases. Additional imaging obtained revealed an 8.3 cm left breast mass.  During that hospitalization he was taken to the OR with Urology for I&D of the perineal abscess and attempted (unsuccessful) placement of right ureteral stent. He was seen in IR for right PCN placement and left iliac bone lesion biopsy which was positive for metastatic breast cancer.   The patient was discharged from the hospital and he followed up with Dr. Roseann 11/05/23. Dr. Roseann recommended IR placement of an internal stent followed by either shockwave lithotripsy or ureteroscopic laser lithotripsy for management. The patient was agreeable to this.   Past Medical History:  Diagnosis Date   Alcohol  abuse    Arthritis    lumbar stenosis    Hypertension    Scrotal abscess    Stroke [facility])    Venous malformation 09/03/2016   Right lower extremity venous malformation    Past Surgical History:  Procedure Laterality Date   CYSTOSCOPY W/ URETERAL STENT PLACEMENT Right 10/24/2023   Procedure: CYSTOSCOPY, WITH RIGHT RETROGRADE PYELOGRAM;  Surgeon: Roseann Adine PARAS., MD;  Location: [facility] OR;  Service: Urology;  Laterality: Right;  POSSIBLE LASER LIPOTRIPSY   IR NEPHROSTOMY PLACEMENT RIGHT  10/25/2023   IR RADIOLOGIST  EVAL & MGMT  04/05/2016   IR RADIOLOGIST EVAL & MGMT  10/17/2016   IR RADIOLOGIST EVAL & MGMT  12/05/2016   IR SCLEROTHERAPY OF A FLUID COLLECTION Right 09/13/2016   Venous malformation [Q27.9]   RLE   IR SCLEROTHERAPY OF A FLUID COLLECTION  09/13/2016   IR US  GUIDE VASC ACCESS RIGHT  09/13/2016   LUMBAR LAMINECTOMY/DECOMPRESSION MICRODISCECTOMY Bilateral 01/26/2015   Procedure: Laminectomy and Foraminotomy - L4-L5 - bilateral;  Surgeon: Alm GORMAN Molt, MD;  Location: [facility] NEUR[facility] ORS;  Service: Neurosurgery;  Laterality: Bilateral;  Laminectomy and Foraminotomy - L4-L5 - bilateral   NASAL ENDOSCOPY WITH EPISTAXIS CONTROL N/A 04/04/2014   Procedure: NASAL ENDOSCOPY WITH EPISTAXIS CONTROL;  Surgeon: Merilee Kraft, MD;  Location: [facility] OR;  Service: ENT;  Laterality: N/A;   NECK SURGERY  1992   posterior fusion- cervical    RADIOLOGY WITH ANESTHESIA N/A 09/13/2016   Procedure: percutaneous sclerotherapy with dehydrated ethanol;  Surgeon: Karalee Beat, MD;  Location: [facility] OR;  Service: Radiology;  Laterality: N/A;   SCROTAL EXPLORATION N/A 10/24/2023   Procedure: EXPLORATION, SCROTUM;  Surgeon: Roseann Adine PARAS., MD;  Location: [facility] OR;  Service: Urology;  Laterality: N/A;  IRRIGATION AND DEBRIDEMENT SCROTUM ABSCESS    Allergies: Penicillins  Medications: Prior to Admission medications   Medication Sig Start Date End Date Taking? Authorizing Provider  acetaminophen  (TYLENOL ) 325 MG tablet Take 2 tablets (650 mg total) by mouth every 6 (six) hours as needed for mild pain (pain score 1-3). 10/27/23  Yes Tharon Lung, MD  amLODipine  (NORVASC ) 5 MG tablet Take 1 tablet (5 mg total) by mouth daily before breakfast. 07/23/19  Yes Danton Reyes DASEN, MD  aspirin  EC 81 MG tablet Take 1 tablet (81 mg total) by mouth daily. Swallow whole. 10/14/23  Yes Larraine Palma, MD  atorvastatin  (LIPITOR ) 80 MG tablet Take 1 tablet (80 mg total) by mouth daily. 07/24/19  Yes Danton Reyes DASEN, MD  feeding supplement (ENSURE  PLUS HIGH PROTEIN) LIQD Take 237 mLs by mouth 2 (two) times daily between meals. 10/28/23  Yes Tharon Lung, MD  gabapentin  (NEURONTIN ) 300 MG capsule Take 1 capsule (300 mg total) by mouth 2 (two) times daily. 10/14/23  Yes Nemecek, Palma, MD  nicotine (NICODERM CQ - DOSED IN MG/24 HOURS) 14 mg/24hr patch Place 1 patch (14 mg total) onto the skin daily. 10/28/23  Yes Mabe, Lung, MD  ondansetron  (ZOFRAN -ODT) 4 MG disintegrating tablet Take 1 tablet (4 mg total) by mouth every 8 (eight) hours as needed for nausea or vomiting. 10/29/23  Yes Nicholas Bar, MD  tamoxifen (NOLVADEX) 20 MG tablet Take 1 tablet (20 mg total) by mouth daily. 11/07/23  Yes Loretha Ash, MD  clindamycin  (CLEOCIN  T) 1 % external solution Apply 1 Application topically 2 (two) times daily. Patient not taking: No sig reported 09/16/23     folic acid  (FOLVITE ) 1 MG tablet Take 1 tablet (1 mg total) by mouth daily. Patient not taking: No sig reported 10/28/23   Tharon Lung, MD  Multiple Vitamin (MULTIVITAMIN WITH MINERALS) TABS tablet Take 1 tablet by mouth daily. Patient not taking: No sig reported 10/28/23   Tharon Lung, MD  polyethylene glycol (MIRALAX / GLYCOLAX) 17 g packet Take 17 g by mouth daily as needed. 10/27/23   Tharon Lung, MD  senna (SENOKOT) 8.6 MG TABS tablet Take 1 tablet (8.6 mg total) by mouth daily as needed for mild constipation. 10/27/23   Tharon Lung, MD  thiamine  (VITAMIN B-1) 100 MG tablet Take 1 tablet (100 mg total) by mouth daily. Patient not taking: No sig reported 10/28/23   Tharon Lung, MD     Family History  Problem Relation Age of Onset   Diabetes Mother    Hypertension Mother    Anemia Mother        Died of aplastic anemia   Lung disease Father    Heart attack Brother    Heart disease Sister        One sister had heart arrest    Social History   Socioeconomic History   Marital status: Divorced    Spouse name: Not on file   Number of children: Not on file   Years of  education: Not on file   Highest education level: Not on file  Occupational History   Not on file  Tobacco Use   Smoking status: Every Day    Current packs/day: 0.00    Types: Cigarettes    Start date: 01/14/1999    Last attempt to quit: 01/13/2006    Years since quitting: 17.8   Smokeless tobacco: Never  Vaping Use   Vaping status: Never Used  Substance and Sexual Activity   Alcohol  use: Yes    Comment: occasional    Drug use: No   Sexual activity: Not on file  Other Topics Concern   Not on file  Social History Narrative   Not on file   Social Drivers of Health   Financial Resource Strain: Not on file  Food Insecurity: No Food Insecurity (10/23/2023)   Hunger Vital Sign    Worried About Running Out of Food in the Last Year: Never true    Ran Out  of Food in the Last Year: Never true  Transportation Needs: No Transportation Needs (10/23/2023)   PRAPARE - Administrator, Civil Service (Medical): No    Lack of Transportation (Non-Medical): No  Physical Activity: Not on file  Stress: Not on file  Social Connections: Socially Isolated (10/23/2023)   Social Connection and Isolation Panel    Frequency of Communication with Friends and Family: Once a week    Frequency of Social Gatherings with Friends and Family: Never    Attends Religious Services: Never    Database Administrator or Organizations: Yes    Attends Banker Meetings: Never    Marital Status: Divorced    Review of Systems: A 12 point ROS discussed and pertinent positives are indicated in the HPI above.  All other systems are negative.  Review of Systems  Gastrointestinal:  Positive for nausea.  Genitourinary:  Positive for flank pain.       Right flank tenderness from right PCN  Neurological:  Positive for weakness.  All other systems reviewed and are negative.   Vital Signs: BP 127/74 (BP Location: Left Arm)   Pulse 88   Temp 98.6 F (37 C) (Oral)   Resp 15   Ht 6' (1.829 m)    Wt 169 lb (76.7 kg)   SpO2 98%   BMI 22.92 kg/m   Physical Exam Constitutional:      General: He is not in acute distress.    Appearance: He is not ill-appearing.  HENT:     Mouth/Throat:     Mouth: Mucous membranes are moist.     Pharynx: Oropharynx is clear.  Cardiovascular:     Rate and Rhythm: Normal rate.  Pulmonary:     Effort: Pulmonary effort is normal.  Abdominal:     Tenderness: There is no abdominal tenderness.  Genitourinary:    Comments: Right PCN to gravity. Urine is amber. Skin insertion site is clean/dry.  Skin:    General: Skin is warm and dry.  Neurological:     Mental Status: He is alert and oriented to person, place, and time.     Imaging:   Labs:  CBC: Recent Labs    10/26/23 0558 10/26/23 1826 10/27/23 0827 11/14/23 0720  WBC 7.0 6.8 9.5 5.2  HGB 8.0* 8.5* 9.6* 8.2*  HCT 24.4* 26.2* 29.4* 25.9*  PLT 106* 109* 118* 188    COAGS: Recent Labs    10/25/23 0821 11/14/23 0720  INR 1.2 1.0    BMP: Recent Labs    10/25/23 0311 10/26/23 0558 10/27/23 0827 11/14/23 0720  NA 136 138 140 134*  K 3.7 3.6 3.8 4.3  CL 102 104 106 97*  CO2 23 23 22 24   GLUCOSE 195* 111* 109* 120*  BUN 17 25* 23 20  CALCIUM  8.6* 8.7* 9.2 9.2  CREATININE 1.14 1.33* 1.37* 1.32*  GFRNONAA >60 59* 57* 59*    LIVER FUNCTION TESTS: Recent Labs    10/22/23 2230 10/23/23 0453 10/24/23 1157 10/25/23 0311  BILITOT 1.4* 1.3* 1.8* 1.4*  AST 227* 205* 298* 245*  ALT 141* 136* 226* 211*  ALKPHOS 491* 451* 487* 508*  PROT 6.9 6.4* 7.1 7.1  ALBUMIN 2.8* 2.4* 2.7* 2.5*    TUMOR MARKERS: No results for input(s): AFPTM, CEA, CA199, CHROMGRNA in the last 8760 hours.  Assessment and Plan:  Right ureteral calculi with hydronephrosis s/p right PCN placement in IR 10/25/23; pending Urology plans for definitive stone treatment: Larry Houston,  68 year old male, presents today for an image-guided right ureteral stent placement.   Risks and benefits of  this procedure were discussed with the patient including, but not limited to, infection, bleeding, significant bleeding causing loss or decrease in renal function or damage to adjacent structures.   All of the patient's questions were answered, patient is agreeable to proceed. He has been NPO.   Consent signed and in chart.  Thank you for this interesting consult.  I greatly enjoyed meeting Larry Houston and look forward to participating in their care.  A copy of this report was sent to the requesting provider on this date.  Electronically Signed: Warren Dais, AGACNP-BC 11/14/2023, 8:56 AM   I spent a total of  30 Minutes   in face to face in clinical consultation, greater than 50% of which was counseling/coordinating care for right ureteral calculus/hydronephrosis.

## 2023-11-14 NOTE — Procedures (Signed)
 Interventional Radiology Procedure:   Indications: Right ureter stone  Procedure: 1) Dilatation of right ureter 2) Placement of antegrade ureter stent 3) Exchange of nephrostomy tube  Findings: Obstructive stone in proximal right ureter.  Dilatation of proximal ureter with 4 mm x 40 mm balloon.  Placement of 6 Fr, 28 double J ureter stent.  Placement of 10 Fr nephrostomy tube in renal pelvis   Complications: None     EBL: Minimal  Plan:  Bedrest 2 hours.  Keep nephrostomy tube to gravity bag.    Erbie Arment R. Philip, MD  Pager: [phone]

## 2023-11-15 ENCOUNTER — Telehealth: Payer: Self-pay

## 2023-11-15 NOTE — Telephone Encounter (Signed)
 LVM for patient informing him that Dr. Loretha would like him to undergo a dental examination prior to starting bisphosphonate treatment due to risk of osteonecrosis.  Encouraged patient to reach out to insurance company to determine dental providers who are in-network. Requested that patient return to clinic to discuss matter further. Call back number and directions provided.

## 2023-11-18 ENCOUNTER — Inpatient Hospital Stay

## 2023-11-20 ENCOUNTER — Other Ambulatory Visit (HOSPITAL_COMMUNITY): Payer: Self-pay

## 2023-11-20 ENCOUNTER — Other Ambulatory Visit: Payer: Self-pay

## 2023-11-20 MED ORDER — ATORVASTATIN CALCIUM 80 MG PO TABS
80.0000 mg | ORAL_TABLET | Freq: Every evening | ORAL | 0 refills | Status: AC
  Filled 2023-11-20: qty 90, 90d supply, fill #0

## 2023-11-25 ENCOUNTER — Telehealth: Payer: Self-pay | Admitting: *Deleted

## 2023-11-25 ENCOUNTER — Telehealth: Payer: Self-pay

## 2023-11-25 NOTE — Telephone Encounter (Signed)
 error

## 2023-11-25 NOTE — Telephone Encounter (Signed)
 Received a call from pt dentis, Dr. Lang with recommendations before pt starts tx. He recommends that pt delay tx due to the 4 lower teeth that have active infection. Advised that pt can either have a dental cleaning to help or extract teeth. Sent provider a message with dentist contact information to f/u.

## 2023-11-26 ENCOUNTER — Other Ambulatory Visit: Payer: Self-pay | Admitting: Family Medicine

## 2023-11-26 ENCOUNTER — Other Ambulatory Visit (HOSPITAL_COMMUNITY): Payer: Self-pay

## 2023-11-26 ENCOUNTER — Encounter (HOSPITAL_COMMUNITY): Payer: Self-pay

## 2023-11-26 NOTE — Telephone Encounter (Signed)
 Pt seems to have been on temporary oxycodone  course post-hospitalization from 10/2023. Will refuse refill request for now; patient already scheduled for appointment with me on 11/21 and can discuss pain medication needs at that time.

## 2023-11-27 ENCOUNTER — Telehealth: Payer: Self-pay

## 2023-11-27 NOTE — Telephone Encounter (Signed)
-----   Message from [place] sent at 11/26/2023  8:15 AM EST ----- [phone], Dr Lang is requesting our records on Mr Utz, can we send my notes to this fax number please. ----- Message ----- From: Vita Rudean CROME, LPN Sent: [phone]   3:00 PM EST To: Amber Stalls, MD; [facility]  Dr. Juliene Lang called and stated that pt may need to delay tx. He still has 4 lower teeth that have peritonitis. The only way that this can be treated with deep cleaning or extraction of the teeth. He wants to speak with you tomorrow when you get a chance. [phone] Juliene Lang

## 2023-11-27 NOTE — Telephone Encounter (Signed)
 Most recent OV note faxed to provided number. Confirmation received.

## 2023-11-29 ENCOUNTER — Ambulatory Visit

## 2023-11-29 NOTE — Assessment & Plan Note (Deleted)
-   Urine Alb/Cr ordered

## 2023-11-29 NOTE — Progress Notes (Deleted)
    SUBJECTIVE:   CHIEF COMPLAINT / HPI:   EASTEN MACEACHERN is a 68 y.o. male presenting for follow-up of ***.  Needs repeat Echo (unable to schedule so far) Complete Advanced Directive Discuss pain management  Oncology: on Tamoxifen ; plan for bone strengthening post-dental eval Urology: got internal stent placed 11/14/23; can return to uro for shockwave lithotripsy vs ureteroscopic laser lithotripsy -> ***patient has not yet followed-up  Healthcare Maintenance: - COVID 2nd shot - Tdap - Shingrix - Medicare Annual Wellness - Diabetic foot exam - Diabetic eye exam - Mammogram - Colonoscopy (referral placed during 10/14/23 visit)  PERTINENT  PMH / PSH: HTN, hx CVA, T2DM chronic alcohol  use, metastatic breast cancer currently on chemo, right proximal ureteral calculus, anemia  OBJECTIVE:   There were no vitals taken for this visit.  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

## 2023-12-02 ENCOUNTER — Ambulatory Visit: Admitting: Urology

## 2023-12-02 NOTE — Progress Notes (Deleted)
 Assessment: 1. Ureteral calculus, right     Plan: I discussed management options for the large right ureteral calculus.  I think it be reasonable to see if interventional radiology is able to place a internal stent.  Following stent placement, we could then consider either shockwave lithotripsy or ureteroscopic laser lithotripsy for management.  Chief Complaint: No chief complaint on file.   HPI: Larry Houston is a 68 y.o. male who presents for continued evaluation of right ureteral calculus and perineal abscess.  He was seen in the hospital in consultation for a perineal abscess and a right ureteral calculus on 10/23/2023.  He presented to the emergency room on 10/23/2023 with right sided back pain and some scrotal swelling with associated pain x 1 week.  He reported abscess at the base of the scrotum which began draining spontaneously.  CT imaging showed a 12 mm proximal right ureteral calculus with associated obstruction.  His creatinine was normal.  He was treated with IV antibiotics.  He was taken to the operating room on 10/24/2023 for cystoscopy, right retrograde pyelogram, and insertion of a right ureteral stent as well as I&D of the scrotal/perineal abscess. A guidewire was unable to be negotiated past the right proximal ureteral calculus.  The perineal abscess was I&D and packed.  He subsequently underwent placement of a right percutaneous nephrostomy tube by interventional radiology on 10/25/2023.  He was discharged from the hospital on 10/27/2023.    During his hospitalization he was also found to have a 8 cm left breast mass and findings consistent with metastatic disease.  A biopsy of a bone lesion showed metastatic breast cancer.  He is scheduled to see oncology later this week.  The perineal incision has been healing well and has almost completely closed.  No drainage or pain associated with this.  He underwent placement of a right ureteral stent by interventional radiology  on 11/14/2023.  Portions of the above documentation were copied from a prior visit for review purposes only.  Allergies: Allergies  Allergen Reactions   Penicillins Hives    Has patient had a PCN reaction causing immediate rash, facial/tongue/throat swelling, SOB or lightheadedness with hypotension: No Has patient had a PCN reaction causing severe rash involving mucus membranes or skin necrosis: No Has patient had a PCN reaction that required hospitalization No Has patient had a PCN reaction occurring within the last 10 years: No If all of the above answers are NO, then may proceed with Cephalosporin use.    PMH: Past Medical History:  Diagnosis Date   Alcohol  abuse    Arthritis    lumbar stenosis    Hypertension    Scrotal abscess    Stroke [facility])    Venous malformation 09/03/2016   Right lower extremity venous malformation    PSH: Past Surgical History:  Procedure Laterality Date   CYSTOSCOPY W/ URETERAL STENT PLACEMENT Right 10/24/2023   Procedure: CYSTOSCOPY, WITH RIGHT RETROGRADE PYELOGRAM;  Surgeon: Roseann Adine PARAS., MD;  Location: [facility] OR;  Service: Urology;  Laterality: Right;  POSSIBLE LASER LIPOTRIPSY   IR DIL URETER RIGHT  11/14/2023   IR NEPHROSTOMY EXCHANGE RIGHT  11/14/2023   IR NEPHROSTOMY PLACEMENT RIGHT  10/25/2023   IR RADIOLOGIST EVAL & MGMT  04/05/2016   IR RADIOLOGIST EVAL & MGMT  10/17/2016   IR RADIOLOGIST EVAL & MGMT  12/05/2016   IR SCLEROTHERAPY OF A FLUID COLLECTION Right 09/13/2016   Venous malformation [Q27.9]   RLE   IR SCLEROTHERAPY OF A  FLUID COLLECTION  09/13/2016   IR URETERAL STENT PLACEMENT EXISTING ACCESS RIGHT  11/14/2023   IR US  GUIDE VASC ACCESS RIGHT  09/13/2016   LUMBAR LAMINECTOMY/DECOMPRESSION MICRODISCECTOMY Bilateral 01/26/2015   Procedure: Laminectomy and Foraminotomy - L4-L5 - bilateral;  Surgeon: Alm GORMAN Molt, MD;  Location: [facility] OR[facility];  Service: Neurosurgery;  Laterality: Bilateral;  Laminectomy and Foraminotomy - L4-L5 -  bilateral   NASAL ENDOSCOPY WITH EPISTAXIS CONTROL N/A 04/04/2014   Procedure: NASAL ENDOSCOPY WITH EPISTAXIS CONTROL;  Surgeon: Merilee Kraft, MD;  Location: [facility] OR;  Service: ENT;  Laterality: N/A;   NECK SURGERY  1992   posterior fusion- cervical    RADIOLOGY WITH ANESTHESIA N/A 09/13/2016   Procedure: percutaneous sclerotherapy with dehydrated ethanol;  Surgeon: Karalee Beat, MD;  Location: [facility] OR;  Service: Radiology;  Laterality: N/A;   SCROTAL EXPLORATION N/A 10/24/2023   Procedure: EXPLORATION, SCROTUM;  Surgeon: Roseann Adine PARAS., MD;  Location: [facility] OR;  Service: Urology;  Laterality: N/A;  IRRIGATION AND DEBRIDEMENT SCROTUM ABSCESS    SH: Social History   Tobacco Use   Smoking status: Every Day    Current packs/day: 0.00    Types: Cigarettes    Start date: 01/14/1999    Last attempt to quit: 01/13/2006    Years since quitting: 17.8   Smokeless tobacco: Never  Vaping Use   Vaping status: Never Used  Substance Use Topics   Alcohol  use: Yes    Comment: occasional    Drug use: No    ROS: Constitutional:  Negative for fever, chills, weight loss CV: Negative for chest pain, previous MI, hypertension Respiratory:  Negative for shortness of breath, wheezing, sleep apnea, frequent cough GI:  Negative for nausea, vomiting, bloody stool, GERD  PE: There were no vitals taken for this visit. GENERAL APPEARANCE:  Well appearing, well developed, well nourished, NAD HEENT:  Atraumatic, normocephalic, oropharynx clear NECK:  Supple without lymphadenopathy or thyromegaly ABDOMEN:  Soft, non-tender, no masses EXTREMITIES:  Moves all extremities well, without clubbing, cyanosis, or edema NEUROLOGIC:  Alert and oriented x 3, normal gait, CN II-XII grossly intact MENTAL STATUS:  appropriate BACK:  Non-tender to palpation, No CVAT SKIN:  Warm, dry, and intact   Results: U/A:

## 2023-12-04 ENCOUNTER — Other Ambulatory Visit (HOSPITAL_COMMUNITY): Payer: Self-pay

## 2023-12-04 ENCOUNTER — Other Ambulatory Visit: Payer: Self-pay

## 2023-12-04 DIAGNOSIS — M545 Low back pain, unspecified: Secondary | ICD-10-CM

## 2023-12-04 MED ORDER — GABAPENTIN 300 MG PO CAPS
300.0000 mg | ORAL_CAPSULE | Freq: Two times a day (BID) | ORAL | 0 refills | Status: DC
  Filled 2023-12-04: qty 60, 30d supply, fill #0

## 2023-12-06 ENCOUNTER — Other Ambulatory Visit: Payer: Self-pay

## 2023-12-09 ENCOUNTER — Other Ambulatory Visit: Payer: Self-pay

## 2023-12-09 ENCOUNTER — Other Ambulatory Visit (HOSPITAL_COMMUNITY): Payer: Self-pay

## 2023-12-10 ENCOUNTER — Ambulatory Visit: Admitting: Urology

## 2023-12-10 ENCOUNTER — Other Ambulatory Visit: Payer: Self-pay

## 2023-12-10 ENCOUNTER — Other Ambulatory Visit (HOSPITAL_COMMUNITY): Payer: Self-pay

## 2023-12-10 ENCOUNTER — Encounter: Payer: Self-pay | Admitting: Urology

## 2023-12-10 ENCOUNTER — Encounter: Payer: Self-pay | Admitting: Pharmacist

## 2023-12-10 VITALS — BP 128/66 | HR 111

## 2023-12-10 DIAGNOSIS — R8290 Unspecified abnormal findings in urine: Secondary | ICD-10-CM

## 2023-12-10 DIAGNOSIS — N201 Calculus of ureter: Secondary | ICD-10-CM | POA: Diagnosis not present

## 2023-12-10 LAB — URINALYSIS, ROUTINE W REFLEX MICROSCOPIC
Bilirubin, UA: NEGATIVE
Glucose, UA: NEGATIVE
Ketones, UA: NEGATIVE
Nitrite, UA: NEGATIVE
Specific Gravity, UA: 1.015 (ref 1.005–1.030)
Urobilinogen, Ur: 0.2 mg/dL (ref 0.2–1.0)
pH, UA: 5.5 (ref 5.0–7.5)

## 2023-12-10 LAB — MICROSCOPIC EXAMINATION: WBC, UA: >30 /HPF — AB (ref 0–5)

## 2023-12-10 MED ORDER — SULFAMETHOXAZOLE-TRIMETHOPRIM 800-160 MG PO TABS
1.0000 | ORAL_TABLET | Freq: Two times a day (BID) | ORAL | 0 refills | Status: AC
  Filled 2023-12-10: qty 14, 7d supply, fill #0

## 2023-12-10 MED ORDER — AMLODIPINE BESYLATE 5 MG PO TABS
5.0000 mg | ORAL_TABLET | Freq: Every day | ORAL | 0 refills | Status: AC
  Filled 2023-12-10: qty 90, 90d supply, fill #0

## 2023-12-10 NOTE — Addendum Note (Signed)
 Addended by: KOLEEN MITZIE BROCKS on: 12/10/2023 09:37 AM   Modules accepted: Orders

## 2023-12-10 NOTE — H&P (View-Only) (Signed)
 Assessment: 1. Ureteral calculus, right   2. Abnormal urine findings     Plan: I discussed management options for the large right ureteral calculus.   I reviewed the recent notes and images from interventional radiology. I discussed management options for the large right ureteral calculus including either shockwave lithotripsy or ureteroscopic laser lithotripsy. Following our discussion, he would like to proceed with right ESWL. Urine culture sent today. Begin Bactrim  DS BID x 7 days. Will cap nephrostomy tube after discussion with IR.  Procedure: The patient will be scheduled for right ESWL at [facility].  Surgical request is placed with the surgery schedulers and will be scheduled at the patient's/family request. Informed consent is given as documented below. Anesthesia: local  The patient does not have sleep apnea, history of MRSA, history of VRE, history of cardiac device requiring special anesthetic needs. Patient is stable and considered clear for surgical management in an outpatient ambulatory surgery setting as well as inpatient hospital setting.  Consent for Operation or Procedure: Provider Certification I hereby certify that the nature, purpose, benefits, usual and most frequent risks of, and alternatives to, the operation or procedure have been explained to the patient (or person authorized to sign for the patient) either by me as responsible physician or by the provider who is to perform the operation or procedure. Time spent such that the patient/family has had an opportunity to ask questions, and that those questions have been answered. The patient or the patient's representative has been advised that selected tasks may be performed by assistants to the primary health care provider(s). I believe that the patient (or person authorized to sign for the patient) understands what has been explained, and has consented to the operation or procedure. No guarantees were implied or  made.    Chief Complaint: Chief Complaint  Patient presents with   Nephrolithiasis    HPI: Larry Houston is a 68 y.o. male who presents for continued evaluation of right ureteral calculus and perineal abscess.  He was seen in the hospital in consultation for a perineal abscess and a right ureteral calculus on 10/23/2023.  He presented to the emergency room on 10/23/2023 with right sided back pain and some scrotal swelling with associated pain x 1 week.  He reported abscess at the base of the scrotum which began draining spontaneously.  CT imaging showed a 9 x 6 x 12 mm proximal right ureteral calculus with associated obstruction.  His creatinine was normal.  He was treated with IV antibiotics.  He was taken to the operating room on 10/24/2023 for cystoscopy, right retrograde pyelogram, and insertion of a right ureteral stent as well as I&D of the scrotal/perineal abscess. A guidewire was unable to be negotiated past the right proximal ureteral calculus.  The perineal abscess was I&D and packed.  He subsequently underwent placement of a right percutaneous nephrostomy tube by interventional radiology on 10/25/2023.  He was discharged from the hospital on 10/27/2023.    During his hospitalization he was also found to have a 8 cm left breast mass and findings consistent with metastatic disease.  A biopsy of a bone lesion showed metastatic breast cancer.  He is following with oncology.  Started on tamoxifen .  The perineal incision has been healing well and has almost completely closed.  No drainage or pain associated with this.  He underwent placement of a right ureteral stent by interventional radiology on 11/14/2023.  Portions of the above documentation were copied from a prior visit  for review purposes only.  Allergies: Allergies  Allergen Reactions   Penicillins Hives    Has patient had a PCN reaction causing immediate rash, facial/tongue/throat swelling, SOB or lightheadedness with  hypotension: No Has patient had a PCN reaction causing severe rash involving mucus membranes or skin necrosis: No Has patient had a PCN reaction that required hospitalization No Has patient had a PCN reaction occurring within the last 10 years: No If all of the above answers are NO, then may proceed with Cephalosporin use.    PMH: Past Medical History:  Diagnosis Date   Alcohol  abuse    Arthritis    lumbar stenosis    Hypertension    Scrotal abscess    Stroke [facility])    Venous malformation 09/03/2016   Right lower extremity venous malformation    PSH: Past Surgical History:  Procedure Laterality Date   CYSTOSCOPY W/ URETERAL STENT PLACEMENT Right 10/24/2023   Procedure: CYSTOSCOPY, WITH RIGHT RETROGRADE PYELOGRAM;  Surgeon: Roseann Adine PARAS., MD;  Location: [facility] OR;  Service: Urology;  Laterality: Right;  POSSIBLE LASER LIPOTRIPSY   IR DIL URETER RIGHT  11/14/2023   IR NEPHROSTOMY EXCHANGE RIGHT  11/14/2023   IR NEPHROSTOMY PLACEMENT RIGHT  10/25/2023   IR RADIOLOGIST EVAL & MGMT  04/05/2016   IR RADIOLOGIST EVAL & MGMT  10/17/2016   IR RADIOLOGIST EVAL & MGMT  12/05/2016   IR SCLEROTHERAPY OF A FLUID COLLECTION Right 09/13/2016   Venous malformation [Q27.9]   RLE   IR SCLEROTHERAPY OF A FLUID COLLECTION  09/13/2016   IR URETERAL STENT PLACEMENT EXISTING ACCESS RIGHT  11/14/2023   IR US  GUIDE VASC ACCESS RIGHT  09/13/2016   LUMBAR LAMINECTOMY/DECOMPRESSION MICRODISCECTOMY Bilateral 01/26/2015   Procedure: Laminectomy and Foraminotomy - L4-L5 - bilateral;  Surgeon: Alm GORMAN Molt, MD;  Location: [facility] NEUR[facility] ORS;  Service: Neurosurgery;  Laterality: Bilateral;  Laminectomy and Foraminotomy - L4-L5 - bilateral   NASAL ENDOSCOPY WITH EPISTAXIS CONTROL N/A 04/04/2014   Procedure: NASAL ENDOSCOPY WITH EPISTAXIS CONTROL;  Surgeon: Merilee Kraft, MD;  Location: [facility] OR;  Service: ENT;  Laterality: N/A;   NECK SURGERY  1992   posterior fusion- cervical    RADIOLOGY WITH ANESTHESIA N/A 09/13/2016    Procedure: percutaneous sclerotherapy with dehydrated ethanol;  Surgeon: Karalee Beat, MD;  Location: [facility] OR;  Service: Radiology;  Laterality: N/A;   SCROTAL EXPLORATION N/A 10/24/2023   Procedure: EXPLORATION, SCROTUM;  Surgeon: Roseann Adine PARAS., MD;  Location: [facility] OR;  Service: Urology;  Laterality: N/A;  IRRIGATION AND DEBRIDEMENT SCROTUM ABSCESS    SH: Social History   Tobacco Use   Smoking status: Every Day    Current packs/day: 0.00    Types: Cigarettes    Start date: 01/14/1999    Last attempt to quit: 01/13/2006    Years since quitting: 17.9   Smokeless tobacco: Never  Vaping Use   Vaping status: Never Used  Substance Use Topics   Alcohol  use: Yes    Comment: occasional    Drug use: No    ROS: Constitutional:  Negative for fever, chills, weight loss CV: Negative for chest pain, previous MI, hypertension Respiratory:  Negative for shortness of breath, wheezing, sleep apnea, frequent cough GI:  Negative for nausea, vomiting, bloody stool, GERD  PE: BP 128/66   Pulse (!) 111  GENERAL APPEARANCE:  Well appearing, well developed, well nourished, NAD HEENT:  Atraumatic, normocephalic, oropharynx clear NECK:  Supple without lymphadenopathy or thyromegaly ABDOMEN:  Soft, non-tender, no masses EXTREMITIES:  Moves all extremities well, without clubbing, cyanosis, or edema NEUROLOGIC:  Alert and oriented x 3, normal gait, CN II-XII grossly intact MENTAL STATUS:  appropriate BACK:  Non-tender to palpation, No CVAT SKIN:  Warm, dry, and intact   Results: U/A: >30 WBC, 3-10 RBC, many bacteria, nitrite negative

## 2023-12-10 NOTE — Progress Notes (Signed)
 Assessment: 1. Ureteral calculus, right   2. Abnormal urine findings     Plan: I discussed management options for the large right ureteral calculus.   I reviewed the recent notes and images from interventional radiology. I discussed management options for the large right ureteral calculus including either shockwave lithotripsy or ureteroscopic laser lithotripsy. Following our discussion, he would like to proceed with right ESWL. Urine culture sent today. Begin Bactrim  DS BID x 7 days. Will cap nephrostomy tube after discussion with IR.  Procedure: The patient will be scheduled for right ESWL at [facility].  Surgical request is placed with the surgery schedulers and will be scheduled at the patient's/family request. Informed consent is given as documented below. Anesthesia: local  The patient does not have sleep apnea, history of MRSA, history of VRE, history of cardiac device requiring special anesthetic needs. Patient is stable and considered clear for surgical management in an outpatient ambulatory surgery setting as well as inpatient hospital setting.  Consent for Operation or Procedure: Provider Certification I hereby certify that the nature, purpose, benefits, usual and most frequent risks of, and alternatives to, the operation or procedure have been explained to the patient (or person authorized to sign for the patient) either by me as responsible physician or by the provider who is to perform the operation or procedure. Time spent such that the patient/family has had an opportunity to ask questions, and that those questions have been answered. The patient or the patient's representative has been advised that selected tasks may be performed by assistants to the primary health care provider(s). I believe that the patient (or person authorized to sign for the patient) understands what has been explained, and has consented to the operation or procedure. No guarantees were implied or  made.    Chief Complaint: Chief Complaint  Patient presents with   Nephrolithiasis    HPI: Larry Houston is a 68 y.o. male who presents for continued evaluation of right ureteral calculus and perineal abscess.  He was seen in the hospital in consultation for a perineal abscess and a right ureteral calculus on 10/23/2023.  He presented to the emergency room on 10/23/2023 with right sided back pain and some scrotal swelling with associated pain x 1 week.  He reported abscess at the base of the scrotum which began draining spontaneously.  CT imaging showed a 9 x 6 x 12 mm proximal right ureteral calculus with associated obstruction.  His creatinine was normal.  He was treated with IV antibiotics.  He was taken to the operating room on 10/24/2023 for cystoscopy, right retrograde pyelogram, and insertion of a right ureteral stent as well as I&D of the scrotal/perineal abscess. A guidewire was unable to be negotiated past the right proximal ureteral calculus.  The perineal abscess was I&D and packed.  He subsequently underwent placement of a right percutaneous nephrostomy tube by interventional radiology on 10/25/2023.  He was discharged from the hospital on 10/27/2023.    During his hospitalization he was also found to have a 8 cm left breast mass and findings consistent with metastatic disease.  A biopsy of a bone lesion showed metastatic breast cancer.  He is following with oncology.  Started on tamoxifen .  The perineal incision has been healing well and has almost completely closed.  No drainage or pain associated with this.  He underwent placement of a right ureteral stent by interventional radiology on 11/14/2023.  Portions of the above documentation were copied from a prior visit  for review purposes only.  Allergies: Allergies  Allergen Reactions   Penicillins Hives    Has patient had a PCN reaction causing immediate rash, facial/tongue/throat swelling, SOB or lightheadedness with  hypotension: No Has patient had a PCN reaction causing severe rash involving mucus membranes or skin necrosis: No Has patient had a PCN reaction that required hospitalization No Has patient had a PCN reaction occurring within the last 10 years: No If all of the above answers are NO, then may proceed with Cephalosporin use.    PMH: Past Medical History:  Diagnosis Date   Alcohol  abuse    Arthritis    lumbar stenosis    Hypertension    Scrotal abscess    Stroke [facility])    Venous malformation 09/03/2016   Right lower extremity venous malformation    PSH: Past Surgical History:  Procedure Laterality Date   CYSTOSCOPY W/ URETERAL STENT PLACEMENT Right 10/24/2023   Procedure: CYSTOSCOPY, WITH RIGHT RETROGRADE PYELOGRAM;  Surgeon: Roseann Adine PARAS., MD;  Location: [facility] OR;  Service: Urology;  Laterality: Right;  POSSIBLE LASER LIPOTRIPSY   IR DIL URETER RIGHT  11/14/2023   IR NEPHROSTOMY EXCHANGE RIGHT  11/14/2023   IR NEPHROSTOMY PLACEMENT RIGHT  10/25/2023   IR RADIOLOGIST EVAL & MGMT  04/05/2016   IR RADIOLOGIST EVAL & MGMT  10/17/2016   IR RADIOLOGIST EVAL & MGMT  12/05/2016   IR SCLEROTHERAPY OF A FLUID COLLECTION Right 09/13/2016   Venous malformation [Q27.9]   RLE   IR SCLEROTHERAPY OF A FLUID COLLECTION  09/13/2016   IR URETERAL STENT PLACEMENT EXISTING ACCESS RIGHT  11/14/2023   IR US  GUIDE VASC ACCESS RIGHT  09/13/2016   LUMBAR LAMINECTOMY/DECOMPRESSION MICRODISCECTOMY Bilateral 01/26/2015   Procedure: Laminectomy and Foraminotomy - L4-L5 - bilateral;  Surgeon: Alm GORMAN Molt, MD;  Location: [facility];  Service: Neurosurgery;  Laterality: Bilateral;  Laminectomy and Foraminotomy - L4-L5 - bilateral   NASAL ENDOSCOPY WITH EPISTAXIS CONTROL N/A 04/04/2014   Procedure: NASAL ENDOSCOPY WITH EPISTAXIS CONTROL;  Surgeon: Merilee Kraft, MD;  Location: [facility] OR;  Service: ENT;  Laterality: N/A;   NECK SURGERY  1992   posterior fusion- cervical    RADIOLOGY WITH ANESTHESIA N/A 09/13/2016    Procedure: percutaneous sclerotherapy with dehydrated ethanol;  Surgeon: Karalee Beat, MD;  Location: [facility] OR;  Service: Radiology;  Laterality: N/A;   SCROTAL EXPLORATION N/A 10/24/2023   Procedure: EXPLORATION, SCROTUM;  Surgeon: Roseann Adine PARAS., MD;  Location: [facility] OR;  Service: Urology;  Laterality: N/A;  IRRIGATION AND DEBRIDEMENT SCROTUM ABSCESS    SH: Social History   Tobacco Use   Smoking status: Every Day    Current packs/day: 0.00    Types: Cigarettes    Start date: 01/14/1999    Last attempt to quit: 01/13/2006    Years since quitting: 17.9   Smokeless tobacco: Never  Vaping Use   Vaping status: Never Used  Substance Use Topics   Alcohol  use: Yes    Comment: occasional    Drug use: No    ROS: Constitutional:  Negative for fever, chills, weight loss CV: Negative for chest pain, previous MI, hypertension Respiratory:  Negative for shortness of breath, wheezing, sleep apnea, frequent cough GI:  Negative for nausea, vomiting, bloody stool, GERD  PE: BP 128/66   Pulse (!) 111  GENERAL APPEARANCE:  Well appearing, well developed, well nourished, NAD HEENT:  Atraumatic, normocephalic, oropharynx clear NECK:  Supple without lymphadenopathy or thyromegaly ABDOMEN:  Soft, non-tender, no masses EXTREMITIES:  Moves all extremities well, without clubbing, cyanosis, or edema NEUROLOGIC:  Alert and oriented x 3, normal gait, CN II-XII grossly intact MENTAL STATUS:  appropriate BACK:  Non-tender to palpation, No CVAT SKIN:  Warm, dry, and intact   Results: U/A: >30 WBC, 3-10 RBC, many bacteria, nitrite negative

## 2023-12-12 ENCOUNTER — Other Ambulatory Visit (HOSPITAL_COMMUNITY): Payer: Self-pay

## 2023-12-13 ENCOUNTER — Encounter (HOSPITAL_COMMUNITY): Payer: Self-pay | Admitting: Urology

## 2023-12-13 ENCOUNTER — Ambulatory Visit: Payer: Self-pay | Admitting: Urology

## 2023-12-13 DIAGNOSIS — N201 Calculus of ureter: Secondary | ICD-10-CM

## 2023-12-13 NOTE — Progress Notes (Signed)
 LITHO PREOP PHONE CALL   ALLERGIES REVIEWED: YES  MEDICATION REVIEW DONE: YES MEDICATIONS THAT PT SHOULD HOLD (LIST): Asa hold 72hr  CAN PT WALK UP STAIRS WITHOUT SHORTNESS OF BREATH: YES HOME O2: NO CPAP:NO  IF YES, INFORMED PT TO BRING CPAP WITH TUBING AND MASK:YES/NO   INFORMED DRIVER NEEDED FOR PROCEDURE: YES   PT WAS GIVEN BLUE FOLDER AT UROLOGY APPT: YES PT INFORMED TO BRING BLUE FOLDER WITH ALL CONTENTS: YES  REVIEWED ARRIVAL TIME AND LOCATION: YES  OTHER PERTINENT INFORMATION:

## 2023-12-15 LAB — URINE CULTURE

## 2023-12-16 ENCOUNTER — Ambulatory Visit: Payer: Self-pay | Admitting: Urology

## 2023-12-16 ENCOUNTER — Telehealth: Payer: Self-pay

## 2023-12-16 ENCOUNTER — Ambulatory Visit (HOSPITAL_COMMUNITY)

## 2023-12-16 ENCOUNTER — Other Ambulatory Visit: Payer: Self-pay

## 2023-12-16 ENCOUNTER — Ambulatory Visit (HOSPITAL_COMMUNITY): Admission: RE | Admit: 2023-12-16 | Discharge: 2023-12-16 | Disposition: A | Attending: Urology | Admitting: Urology

## 2023-12-16 ENCOUNTER — Other Ambulatory Visit (HOSPITAL_COMMUNITY): Payer: Self-pay

## 2023-12-16 ENCOUNTER — Encounter (HOSPITAL_COMMUNITY): Payer: Self-pay | Admitting: Urology

## 2023-12-16 ENCOUNTER — Encounter: Admission: RE | Disposition: A | Payer: Self-pay | Attending: Urology

## 2023-12-16 DIAGNOSIS — N201 Calculus of ureter: Secondary | ICD-10-CM

## 2023-12-16 DIAGNOSIS — R8290 Unspecified abnormal findings in urine: Secondary | ICD-10-CM

## 2023-12-16 HISTORY — PX: EXTRACORPOREAL SHOCK WAVE LITHOTRIPSY: SHX1557

## 2023-12-16 SURGERY — LITHOTRIPSY, ESWL
Anesthesia: LOCAL | Laterality: Right

## 2023-12-16 MED ORDER — DIAZEPAM 5 MG PO TABS
10.0000 mg | ORAL_TABLET | ORAL | Status: AC
  Administered 2023-12-16: 5 mg via ORAL
  Filled 2023-12-16: qty 2

## 2023-12-16 MED ORDER — DIPHENHYDRAMINE HCL 25 MG PO CAPS
25.0000 mg | ORAL_CAPSULE | ORAL | Status: AC
  Administered 2023-12-16: 25 mg via ORAL
  Filled 2023-12-16: qty 1

## 2023-12-16 MED ORDER — CIPROFLOXACIN HCL 500 MG PO TABS
500.0000 mg | ORAL_TABLET | ORAL | Status: AC
  Administered 2023-12-16: 500 mg via ORAL
  Filled 2023-12-16: qty 1

## 2023-12-16 MED ORDER — FLUCONAZOLE 100 MG PO TABS
100.0000 mg | ORAL_TABLET | Freq: Every day | ORAL | 0 refills | Status: DC
  Filled 2023-12-16: qty 7, 7d supply, fill #0

## 2023-12-16 NOTE — Interval H&P Note (Signed)
 History and Physical Interval Note:  12/16/2023 1:25 PM  Larry Houston  has presented today for surgery, with the diagnosis of Calculus of ureter.   Patient with increased shortness of breath today.  He reports this is a recent occurrence for him.  He was having some difficulty with SOB with answering questions.  He has metastatic breast cancer with chest adenopathy.  I discussed the situation with the [facility] staff and they do not feel comfortable sedating him given his current situation.  Anesthesia was contacted and requested evaluation by his PCP to attempt to optimize his pulmonary status prior to any procedure requiring sedation/anesthesia.  I discussed this with the patient and his caregiver.   Will cancel procedure for today and arrange for follow-up with his PCP in hopes of rescheduling the procedure with CRNA availability.  Adine Manly

## 2023-12-16 NOTE — Telephone Encounter (Signed)
 Received message from provider that patient needs to be seen for increased issues with shortness of breath.   He was supposed to have procedure today, however, this was cancelled due to increased SOB.   Called patient to gather more information and to schedule.   He did not answer, LVM requesting that patient return call to office.   Chiquita JAYSON English, RN

## 2023-12-16 NOTE — Progress Notes (Signed)
 Short Stay Litho Nurse Note: client returned back to pre procedural area, Litho Tx cancelled by attending MD. Monitored in Short Stay area, VSS, denies pain, noted to have increased shortness of breath with exertion, was attempting to stand and get dressed, noted to short of breath. Some tachypnea was demonstrated, BS slightly diminished bilaterally, POX 96-98% on RA. Please see Dr. Roseann, MD note.

## 2023-12-16 NOTE — Telephone Encounter (Signed)
-----   Message from Adine Manly sent at 12/16/2023  1:34 PM EST ----- Please notify patient that a rx for fluconazole  was sent to his pharmacy for treatment of yeast on urine culture.

## 2023-12-16 NOTE — Telephone Encounter (Signed)
Ok per DPR, LMOM notifying patient.  

## 2023-12-17 ENCOUNTER — Other Ambulatory Visit: Payer: Self-pay

## 2023-12-17 ENCOUNTER — Other Ambulatory Visit (HOSPITAL_COMMUNITY): Payer: Self-pay

## 2023-12-17 ENCOUNTER — Telehealth: Payer: Self-pay

## 2023-12-17 ENCOUNTER — Encounter (HOSPITAL_COMMUNITY): Payer: Self-pay | Admitting: Urology

## 2023-12-17 DIAGNOSIS — R8290 Unspecified abnormal findings in urine: Secondary | ICD-10-CM

## 2023-12-17 MED ORDER — FLUCONAZOLE 100 MG PO TABS
100.0000 mg | ORAL_TABLET | Freq: Every day | ORAL | 0 refills | Status: AC
  Filled 2023-12-17 – 2023-12-21 (×2): qty 7, 7d supply, fill #0

## 2023-12-17 NOTE — Telephone Encounter (Signed)
 Called pt and let him know to check his VM and return PCP's call ab scheduling an appt.

## 2023-12-17 NOTE — Telephone Encounter (Signed)
-----   Message from [place] T sent at 12/17/2023 11:44 AM EST ----- Regarding: refill Patient would like rx for fluconazole  to be sent to [facility] (rx was sent to [facility])

## 2023-12-17 NOTE — Telephone Encounter (Signed)
 Resent Rx to [facility]. Called pt to let him know it had been changed and pt wanted to know if Dr Roseann had spoken with his PCP regarding his breathing. Told pt I was unsure but I would send a message.

## 2023-12-18 ENCOUNTER — Other Ambulatory Visit: Payer: Self-pay | Admitting: *Deleted

## 2023-12-18 ENCOUNTER — Telehealth: Payer: Self-pay

## 2023-12-18 DIAGNOSIS — C50912 Malignant neoplasm of unspecified site of left female breast: Secondary | ICD-10-CM

## 2023-12-18 NOTE — Telephone Encounter (Signed)
 Spoke with patient and confirmed appointment on 12/11

## 2023-12-19 ENCOUNTER — Telehealth: Payer: Self-pay | Admitting: *Deleted

## 2023-12-19 ENCOUNTER — Inpatient Hospital Stay: Attending: Hematology and Oncology

## 2023-12-19 ENCOUNTER — Inpatient Hospital Stay: Admitting: Hematology and Oncology

## 2023-12-19 ENCOUNTER — Other Ambulatory Visit: Payer: Self-pay | Admitting: Urology

## 2023-12-19 VITALS — BP 127/73 | HR 86 | Temp 98.7°F | Resp 16 | Wt 152.3 lb

## 2023-12-19 DIAGNOSIS — C50922 Malignant neoplasm of unspecified site of left male breast: Secondary | ICD-10-CM

## 2023-12-19 DIAGNOSIS — C7951 Secondary malignant neoplasm of bone: Secondary | ICD-10-CM | POA: Insufficient documentation

## 2023-12-19 DIAGNOSIS — N201 Calculus of ureter: Secondary | ICD-10-CM | POA: Diagnosis not present

## 2023-12-19 DIAGNOSIS — Z79899 Other long term (current) drug therapy: Secondary | ICD-10-CM | POA: Insufficient documentation

## 2023-12-19 DIAGNOSIS — Z170 Estrogen receptor positive status [ER+]: Secondary | ICD-10-CM | POA: Diagnosis not present

## 2023-12-19 DIAGNOSIS — F17210 Nicotine dependence, cigarettes, uncomplicated: Secondary | ICD-10-CM | POA: Insufficient documentation

## 2023-12-19 DIAGNOSIS — Z7982 Long term (current) use of aspirin: Secondary | ICD-10-CM | POA: Diagnosis not present

## 2023-12-19 DIAGNOSIS — Z79810 Long term (current) use of selective estrogen receptor modulators (SERMs): Secondary | ICD-10-CM | POA: Diagnosis not present

## 2023-12-19 DIAGNOSIS — Z1732 Human epidermal growth factor receptor 2 negative status: Secondary | ICD-10-CM | POA: Diagnosis not present

## 2023-12-19 DIAGNOSIS — C50912 Malignant neoplasm of unspecified site of left female breast: Secondary | ICD-10-CM

## 2023-12-19 DIAGNOSIS — C7802 Secondary malignant neoplasm of left lung: Secondary | ICD-10-CM | POA: Insufficient documentation

## 2023-12-19 DIAGNOSIS — Z1721 Progesterone receptor positive status: Secondary | ICD-10-CM | POA: Diagnosis not present

## 2023-12-19 DIAGNOSIS — E46 Unspecified protein-calorie malnutrition: Secondary | ICD-10-CM | POA: Insufficient documentation

## 2023-12-19 LAB — CMP (CANCER CENTER ONLY)
ALT: 10 U/L (ref 0–44)
AST: 66 U/L — ABNORMAL HIGH (ref 15–41)
Albumin: 3.6 g/dL (ref 3.5–5.0)
Alkaline Phosphatase: 466 U/L — ABNORMAL HIGH (ref 38–126)
Anion gap: 11 (ref 5–15)
BUN: 17 mg/dL (ref 8–23)
CO2: 24 mmol/L (ref 22–32)
Calcium: 9.5 mg/dL (ref 8.9–10.3)
Chloride: 104 mmol/L (ref 98–111)
Creatinine: 1.32 mg/dL — ABNORMAL HIGH (ref 0.61–1.24)
GFR, Estimated: 59 mL/min — ABNORMAL LOW (ref >60)
Glucose, Bld: 149 mg/dL — ABNORMAL HIGH (ref 70–99)
Potassium: 4.7 mmol/L (ref 3.5–5.1)
Sodium: 139 mmol/L (ref 135–145)
Total Bilirubin: 0.5 mg/dL (ref 0.0–1.2)
Total Protein: 8.5 g/dL — ABNORMAL HIGH (ref 6.5–8.1)

## 2023-12-19 LAB — CBC WITH DIFFERENTIAL (CANCER CENTER ONLY)
Abs Immature Granulocytes: 0.03 K/uL (ref 0.00–0.07)
Basophils Absolute: 0 K/uL (ref 0.0–0.1)
Basophils Relative: 0 %
Eosinophils Absolute: 0.1 K/uL (ref 0.0–0.5)
Eosinophils Relative: 1 %
HCT: 24.4 % — ABNORMAL LOW (ref 39.0–52.0)
Hemoglobin: 7.6 g/dL — ABNORMAL LOW (ref 13.0–17.0)
Immature Granulocytes: 0 %
Lymphocytes Relative: 33 %
Lymphs Abs: 2.4 K/uL (ref 0.7–4.0)
MCH: 31.1 pg (ref 26.0–34.0)
MCHC: 31.1 g/dL (ref 30.0–36.0)
MCV: 100 fL (ref 80.0–100.0)
Monocytes Absolute: 0.5 K/uL (ref 0.1–1.0)
Monocytes Relative: 7 %
Neutro Abs: 4.3 K/uL (ref 1.7–7.7)
Neutrophils Relative %: 59 %
Platelet Count: 182 K/uL (ref 150–400)
RBC: 2.44 MIL/uL — ABNORMAL LOW (ref 4.22–5.81)
RDW: 21.2 % — ABNORMAL HIGH (ref 11.5–15.5)
WBC Count: 7.4 K/uL (ref 4.0–10.5)
nRBC: 0 % (ref 0.0–0.2)

## 2023-12-19 NOTE — Telephone Encounter (Signed)
 Faxed dental clearance to [facility] at [phone]. Confirmation of receipt confirmed

## 2023-12-19 NOTE — Progress Notes (Signed)
  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 Tamoxifen  shows early response with breast mass softening. Palbociclib addition deferred due to infection risk from urinary catheter and pending urological intervention. - Continue tamoxifen  daily. - Deferred palbociclib pending urology input on procedure timing and infection risk. - Messaged urologist for timing of urological intervention and palbociclib initiation. - Monthly follow-up to monitor compliance and response.  Malnutrition and weight loss Ongoing malnutrition and weight loss due to poor oral intake post-dental extractions, low energy, and malignancy. - Encouraged high-protein nutritional supplements (Ensure). - Discussed importance of maintaining oral intake. - Offered referral to child psychotherapist for support and financial resources.  Edentulism following dental extractions Edentulous post-extractions, awaiting dentures. Dental clearance needed for zoledronic acid due to bone metastases. Adequate healing required to avoid osteonecrosis. - Advised seeking affordable denture fitting. - Contacted [facility] for dental clearance for zoledronic acid.   Ureteral stone with indwelling urinary catheter Ureteral stone with Foley catheter. Urology plans shock wave lithotripsy; timing unclear. No infection or abscess.  - Deferred palbociclib until post-urological intervention or safety confirmation.    HISTORY OF PRESENTING ILLNESS:  Larry Houston 68  y.o. male is here because of metastatic male breast cancer  Oncology History  Breast cancer metastasized to bone (HCC)  10/23/2023 Initial Diagnosis   Breast cancer metastasized to bone (HCC)   10/23/2023 Imaging   IMPRESSION: 1. No pulmonary embolism. 2. 8.3 cm left breast mass with overlying cutaneous thickening, suspicious for male breast cancer. 3. Left axillary, left subpectoral, and subcarinal nodal metastases. 4. Subpleural pulmonary nodularity, suspicious for metastases in this clinical setting. 5. Diffuse osseous metastases throughout the visualized axial and appendicular skeleton. 6. Trace bilateral pleural effusions.     10/25/2023 Pathology Results   BONE, LEFT ILIAC, BIOPSY:       Metastatic carcinoma.       See comment.  ADDENDUM:  Immunohistochemical stains were performed to characterize the tumor cells. The cells are strongly and diffusely positive for GATA3. The cells are negative for SOX10, NKX3.1, CDX2, PAX8, and TTF-1. The findings are in keeping with metastatic carcinoma of breast primary. Breast prognostic markers (ER, PR, Her2) will be performed and reported in an addendum.   IMMUNOHISTOCHEMICAL AND MORPHOMETRIC ANALYSIS PERFORMED MANUALLY   The tumor cells are negative for Her2 (0).   Estrogen Receptor:  90%, positive, moderate to strong staining intensity   Progesterone Receptor:  50%, positive, moderate to strong staining  intensity    11/09/2023 Cancer Staging   Staging form: Breast, AJCC 8th Edition - Clinical stage from 11/09/2023: Stage IV (cTX, cNX, cM1, GX, ER+, PR+, HER2-) - Signed by Loretha Ash, MD on 11/09/2023 Stage prefix: Initial diagnosis Histologic grading system: 3 grade system    Discussed the use of AI scribe software for clinical note transcription with the patient, who gave verbal consent to proceed.  History of Present Illness  Larry Houston is a 68 year old male with metastatic breast cancer to bone, lymph nodes,  and lung who presents for oncology follow-up to assess disease status and management of treatment-related complications.  He  has been taking tamoxifen  daily for metastatic breast cancer for just over one month, with caregiver assistance as needed. He has not started Ibrance due to concerns about infection risk related to his indwelling urinary catheter and pending urological intervention. The breast mass is sometimes perceived  'rolling,' but is softer than before. He has been aware of the mass for several years. He denies shortness of breath, fevers, chills, or sweats.  He continues to have a Foley catheter in place for a ureteral stone, with urology planning shock wave lithotripsy. The procedure was delayed due to anesthesia risk, though he currently denies respiratory symptoms. He is uncertain about the urology plan and relies on his caregiver for communication with other providers.  Two weeks ago, he underwent extraction of his remaining four teeth and is now edentulous, which has significantly impaired his ability to chew and contributed to ongoing weight loss and low energy. He is awaiting dentures but has not scheduled follow-up due to financial concerns. He attempts to eat soft foods and drinks high-protein supplements, but his caregiver reports poor oral intake and that he spends most of the day sleeping. He attributes his poor intake and weight loss to edentulism.  All other systems were reviewed with the patient and are negative.  MEDICAL HISTORY:  Past Medical History:  Diagnosis Date   Alcohol  abuse    Arthritis    lumbar stenosis    Hypertension    Scrotal abscess    Stroke [facility])    Venous malformation 09/03/2016   Right lower extremity venous malformation    SURGICAL HISTORY: Past Surgical History:  Procedure Laterality Date   CYSTOSCOPY W/ URETERAL STENT PLACEMENT Right 10/24/2023   Procedure: CYSTOSCOPY, WITH RIGHT RETROGRADE PYELOGRAM;  Surgeon: Roseann Adine PARAS., MD;   Location: [facility] OR;  Service: Urology;  Laterality: Right;  POSSIBLE LASER LIPOTRIPSY   EXTRACORPOREAL SHOCK WAVE LITHOTRIPSY Right 12/16/2023   Procedure: LITHOTRIPSY, ESWL;  Surgeon: Roseann Adine PARAS., MD;  Location: [facility] ORS;  Service: Urology;  Laterality: Right;   IR DIL URETER RIGHT  11/14/2023   IR NEPHROSTOMY EXCHANGE RIGHT  11/14/2023   IR NEPHROSTOMY PLACEMENT RIGHT  10/25/2023   IR RADIOLOGIST EVAL & MGMT  04/05/2016   IR RADIOLOGIST EVAL & MGMT  10/17/2016   IR RADIOLOGIST EVAL & MGMT  12/05/2016   IR SCLEROTHERAPY OF A FLUID COLLECTION Right 09/13/2016   Venous malformation [Q27.9]   RLE   IR SCLEROTHERAPY OF A FLUID COLLECTION  09/13/2016   IR URETERAL STENT PLACEMENT EXISTING ACCESS RIGHT  11/14/2023   IR US  GUIDE VASC ACCESS RIGHT  09/13/2016   LUMBAR LAMINECTOMY/DECOMPRESSION MICRODISCECTOMY Bilateral 01/26/2015   Procedure: Laminectomy and Foraminotomy - L4-L5 - bilateral;  Surgeon: Alm GORMAN Molt, MD;  Location: [facility] NEURO ORS;  Service: Neurosurgery;  Laterality: Bilateral;  Laminectomy and Foraminotomy - L4-L5 - bilateral   NASAL ENDOSCOPY WITH EPISTAXIS CONTROL N/A 04/04/2014   Procedure: NASAL ENDOSCOPY WITH EPISTAXIS CONTROL;  Surgeon: Merilee Kraft, MD;  Location: [facility] OR;  Service: ENT;  Laterality: N/A;   NECK SURGERY  1992   posterior fusion- cervical    RADIOLOGY WITH ANESTHESIA N/A 09/13/2016   Procedure: percutaneous sclerotherapy with dehydrated ethanol;  Surgeon: Karalee Beat, MD;  Location[facility] OR;  Service: Radiology;  Laterality: N/A;   SCROTAL EXPLORATION N/A 10/24/2023   Procedure: EXPLORATION, SCROTUM;  Surgeon: Roseann Adine PARAS., MD;  Location: [facility] OR;  Service: Urology;  Laterality: N/A;  IRRIGATION AND DEBRIDEMENT SCROTUM ABSCESS    SOCIAL HISTORY:  Social History   Socioeconomic History   Marital status: Divorced    Spouse name: Not on file   Number of children: Not on file   Years of education: Not on file   Highest education level: Not on file   Occupational History   Not on file  Tobacco Use   Smoking status: Every Day    Current packs/day: 0.00    Types: Cigarettes    Start date: 01/14/1999    Last attempt to quit: 01/13/2006    Years since quitting: 17.9   Smokeless tobacco: Never  Vaping Use   Vaping status: Never Used  Substance and Sexual Activity   Alcohol  use: Yes    Comment: occasional    Drug use: No   Sexual activity: Not on file  Other Topics Concern   Not on file  Social History Narrative   Not on file   Social Drivers of Health   Tobacco Use: High Risk (12/16/2023)   Patient History    Smoking Tobacco Use: Every Day    Smokeless Tobacco Use: Never    Passive Exposure: Not on file  Financial Resource Strain: Not on file  Food Insecurity: No Food Insecurity (10/23/2023)   Epic    Worried About Programme Researcher, Broadcasting/film/video in the Last Year: Never true    Ran Out of Food in the Last Year: Never true  Transportation Needs: No Transportation Needs (10/23/2023)   Epic    Lack of Transportation (Medical): No    Lack of Transportation (Non-Medical): No  Physical Activity: Not on file  Stress: Not on file  Social Connections: Socially Isolated (10/23/2023)   Social Connection and Isolation Panel    Frequency of Communication with Friends and Family: Once a week    Frequency of Social Gatherings with Friends and Family: Never    Attends Religious Services: Never    Database Administrator or Organizations: Yes    Attends Banker Meetings: Never    Marital Status: Divorced  Catering Manager Violence: Not At Risk (10/23/2023)   Epic    Fear of Current or Ex-Partner: No    Emotionally Abused: No    Physically Abused: No    Sexually Abused: No  Depression (PHQ2-9): Not on file  Alcohol  Screen: Not on file  Housing: High Risk (10/23/2023)   Epic    Unable to Pay for Housing in the Last Year: Yes    Number of Times Moved in the Last Year: 0    Homeless in the Last Year: No  Utilities: Not At Risk  (10/23/2023)   Epic    Threatened with loss of utilities: No  Health Literacy: Not on file    FAMILY HISTORY: Family History  Problem Relation Age of Onset   Diabetes Mother    Hypertension Mother    Anemia Mother        Died of aplastic anemia   Lung disease Father    Heart attack Brother    Heart disease Sister        One sister had heart arrest    ALLERGIES:  is allergic to penicillins.  MEDICATIONS:  Current Outpatient Medications  Medication Sig Dispense Refill   acetaminophen  (TYLENOL ) 325 MG tablet Take 2 tablets (650 mg total) by mouth every 6 (six) hours as needed for mild pain (pain score 1-3).     amLODipine  (NORVASC ) 5 MG tablet Take 1 tablet (5 mg total) by mouth daily. 90 tablet 0   aspirin   EC 81 MG tablet Take 1 tablet (81 mg total) by mouth daily. Swallow whole. 90 tablet 3   atorvastatin  (LIPITOR ) 80 MG tablet Take 1 tablet (80 mg total) by mouth daily. 30 tablet 0   atorvastatin  (LIPITOR ) 80 MG tablet Take 1 tablet (80 mg total) by mouth every evening. 90 tablet 0   [Paused] clindamycin  (CLEOCIN  T) 1 % external solution Apply 1 Application topically 2 (two) times daily. 30 mL 0   feeding supplement (ENSURE PLUS HIGH PROTEIN) LIQD Take 237 mLs by mouth 2 (two) times daily between meals.     fluconazole  (DIFLUCAN ) 100 MG tablet Take 1 tablet (100 mg total) by mouth daily for 7 days. 7 tablet 0   folic acid  (FOLVITE ) 1 MG tablet Take 1 tablet (1 mg total) by mouth daily.     gabapentin  (NEURONTIN ) 300 MG capsule Take 1 capsule (300 mg total) by mouth 2 (two) times daily. 60 capsule 0   Multiple Vitamin (MULTIVITAMIN WITH MINERALS) TABS tablet Take 1 tablet by mouth daily.     nicotine  (NICODERM CQ  - DOSED IN MG/24 HOURS) 14 mg/24hr patch Place 1 patch (14 mg total) onto the skin daily. 28 patch 0   ondansetron  (ZOFRAN -ODT) 4 MG disintegrating tablet Take 1 tablet (4 mg total) by mouth every 8 (eight) hours as needed for nausea or vomiting. 15 tablet 0    polyethylene glycol (MIRALAX  / GLYCOLAX ) 17 g packet Take 17 g by mouth daily as needed.     senna (SENOKOT) 8.6 MG TABS tablet Take 1 tablet (8.6 mg total) by mouth daily as needed for mild constipation.     sulfamethoxazole -trimethoprim  (BACTRIM  DS) 800-160 MG tablet Take 1 tablet by mouth every 12 (twelve) hours for 7 days. 14 tablet 0   tamoxifen  (NOLVADEX ) 20 MG tablet Take 1 tablet (20 mg total) by mouth daily. 90 tablet 3   thiamine  (VITAMIN B-1) 100 MG tablet Take 1 tablet (100 mg total) by mouth daily.     No current facility-administered medications for this visit.     PHYSICAL EXAMINATION: ECOG PERFORMANCE STATUS: 2 - Symptomatic, <50% confined to bed  Vitals:   12/19/23 0925  BP: 127/73  Pulse: 86  Resp: 16  Temp: 98.7 F (37.1 C)  SpO2: 95%   Filed Weights   12/19/23 0925  Weight: 152 lb 4.8 oz (69.1 kg)    GENERAL:alert, no distress and comfortable Left sided breast mass softer on palpation, left axillary adenopathy palpable. CTA bilaterally RRR No LE edema.  LABORATORY DATA:  I have reviewed the data as listed Lab Results  Component Value Date   WBC 7.4 12/19/2023   HGB 7.6 (L) 12/19/2023   HCT 24.4 (L) 12/19/2023   MCV 100.0 12/19/2023   PLT 182 12/19/2023     Chemistry      Component Value Date/Time   NA 139 12/19/2023 0845   K 4.7 12/19/2023 0845   CL 104 12/19/2023 0845   CO2 24 12/19/2023 0845   BUN 17 12/19/2023 0845   CREATININE 1.32 (H) 12/19/2023 0845      Component Value Date/Time   CALCIUM  9.5 12/19/2023 0845   ALKPHOS 466 (H) 12/19/2023 0845   AST 66 (H) 12/19/2023 0845   ALT 10 12/19/2023 0845   BILITOT 0.5 12/19/2023 0845       RADIOGRAPHIC STUDIES: I have personally reviewed the radiological images as listed and agreed with the findings in the report. DG Abd 1 View Result Date: 12/16/2023 CLINICAL DATA:  Right ureteral calculus.  Pre lithotripsy. EXAM: ABDOMEN - 1 VIEW COMPARISON:  CT 10/22/2023 FINDINGS: Right  nephroureteral stent in place. There is also a right nephrostomy tube. Pigtail catheters coil over 1 another in the region of the renal pelvis. The proximal urate ureteral calculus on prior CT is potentially visualized overlying the right L3 transverse process, although obscured by overlapping osseous structures. Nonobstructive bowel gas pattern. Diffuse bony sclerosis typical of sclerotic metastasis, without significant change. IMPRESSION: 1. Right nephroureteral stent and right nephrostomy tube in place. 2. Proximal right ureteral calculus on prior CT is potentially visualized overlying the right L3 transverse process, although obscured by overlapping osseous structures. Electronically Signed   By: Andrea Gasman M.D.   On: 12/16/2023 14:58    All questions were answered. The patient knows to call the clinic with any problems, questions or concerns. I spent 40 minutes in the care of this patient including H and P, review of records, counseling and coordination of care.     Amber Stalls, MD 12/19/2023 9:37 AM

## 2023-12-20 ENCOUNTER — Inpatient Hospital Stay: Admitting: Licensed Clinical Social Worker

## 2023-12-20 NOTE — Progress Notes (Signed)
 [facility] Clinical Social Work  Initial Assessment   Larry Houston is a 68 y.o. year old male contacted by phone. Clinical Social Work was referred by medical provider for assessment of psychosocial needs.   SDOH (Social Determinants of Health) assessments performed: Yes SDOH Interventions    Flowsheet Row Clinical Support from 12/20/2023 in [facility] - A Dept Of [place]  SDOH Interventions   Food Insecurity Interventions Intervention Not Indicated  Housing Interventions Intervention Not Indicated  Transportation Interventions Patient Resources (Friends/Family)  Utilities Interventions Intervention Not Indicated    SDOH Screenings   Food Insecurity: No Food Insecurity (12/20/2023)  Housing: Low Risk (12/20/2023)  Recent Concern: Housing - High Risk (10/23/2023)  Transportation Needs: No Transportation Needs (12/20/2023)  Utilities: Not At Risk (12/20/2023)  Social Connections: Socially Isolated (10/23/2023)  Tobacco Use: High Risk (12/16/2023)    PHQ 2/9:     No data to display           Distress Screen completed: No     No data to display            Family/Social Information:  Housing Arrangement: patient lives with a roommate Family members/support persons in your life? roommate Transportation concerns: no, roommate helps  Employment: Retired.  Income source: Actor concerns: No Type of concern: None Food access concerns: no Religious or spiritual practice: Not known Advanced directives: No Services Currently in place:  [facility]  Coping/ Adjustment to diagnosis: Patient understands treatment plan and what happens next? yes, he is following up with urology as well as dentistry. His roommate is bringing him to a dentist next week to look into dentures to help with his eating Patient reported stressors: Physical issues- peeing often. Has follow-up with urology     SUMMARY: Current SDOH Barriers:  Pt denied any SDOH barriers today  Clinical Social Work Clinical Goal(s):  No clinical social work goals at this time  Interventions: Informed patient of the support team roles and support services at [facility] Provided CSW contact information and encouraged patient to call with any questions or concerns   Follow Up Plan: Patient will contact CSW with any support or resource needs Patient verbalizes understanding of plan: Yes    Damisha Wolff E Saylee Sherrill, LCSW Clinical Social Worker [facility]

## 2023-12-21 ENCOUNTER — Other Ambulatory Visit (HOSPITAL_COMMUNITY): Payer: Self-pay

## 2023-12-22 ENCOUNTER — Other Ambulatory Visit (HOSPITAL_COMMUNITY): Payer: Self-pay

## 2023-12-23 ENCOUNTER — Ambulatory Visit

## 2023-12-23 ENCOUNTER — Other Ambulatory Visit: Payer: Self-pay

## 2023-12-23 ENCOUNTER — Encounter: Payer: Self-pay | Admitting: Pharmacist

## 2023-12-23 ENCOUNTER — Other Ambulatory Visit (HOSPITAL_COMMUNITY): Payer: Self-pay

## 2023-12-23 NOTE — Progress Notes (Deleted)
° ° °  SUBJECTIVE:   CHIEF COMPLAINT / HPI:   Larry Houston is a 68 y.o. male presenting for shortness of breath.  Shortness of Breath Previously scheduled to undergo lithotripsy with urology on 12/8; however, procedure was cancelled given increased shortness of breath. Today, ***  Healthcare Maintenance - Advanced directive - Repeat echo (ordered 10/29/2023) - COVID-vaccine - Shingrix vaccine - Tdap booster - Diabetic foot exam - Diabetic eye exam - Colonoscopy - Medicare annual wellness visit - Urine albumin creatinine ratio - ordered this visit  PERTINENT  PMH / PSH: Breast cancer metastasized to bone; right kidney with calculus + hydronephrosis s/p nephrostomy tube  OBJECTIVE:   There were no vitals taken for this visit.  ***  ASSESSMENT/PLAN:   Assessment & Plan Shortness of breath Requires optimization prior to lithotripsy with nephrology (Dr. Adine Manly). - *** Type 2 diabetes mellitus without complication, without long-term current use of insulin  (HCC) - Labs: Urine albumin creatinine ratio pending     Alan Flies, MD [facility]

## 2023-12-23 NOTE — Assessment & Plan Note (Deleted)
-   Labs: Urine albumin creatinine ratio pending

## 2023-12-25 ENCOUNTER — Other Ambulatory Visit: Payer: Self-pay | Admitting: Hematology and Oncology

## 2023-12-25 NOTE — Progress Notes (Signed)
 Dental clearance OK received via fax from [facility] to proceed with Zometa. Sent to HIM to be scanned.

## 2023-12-25 NOTE — Progress Notes (Signed)
 Zometa plan signed. Message sent to scheduling.  Candy Leverett

## 2023-12-30 ENCOUNTER — Encounter: Admitting: Urology

## 2023-12-31 ENCOUNTER — Telehealth: Payer: Self-pay | Admitting: Hematology and Oncology

## 2023-12-31 NOTE — Telephone Encounter (Signed)
 I left voicemail for patient to return my call to schedule Zometa infusion.

## 2024-01-03 ENCOUNTER — Other Ambulatory Visit (HOSPITAL_COMMUNITY)

## 2024-01-15 ENCOUNTER — Other Ambulatory Visit: Payer: Self-pay

## 2024-01-15 DIAGNOSIS — C50912 Malignant neoplasm of unspecified site of left female breast: Secondary | ICD-10-CM

## 2024-01-15 NOTE — Progress Notes (Deleted)
" ° ° °  SUBJECTIVE:   CHIEF COMPLAINT / HPI:   Larry Houston is a 69 y.o. male presenting for follow-up of ***.  Previously scheduled to undergo lithotripsy with urology on 12/8; however, procedure was cancelled given increased shortness of breath. *** Needs repeat Echo (unable to schedule so far) Complete Advanced Directive Discuss pain management Fu w urology for lithotripsy Reach out to Dr. Adine Manly with nephrology once cleared for his SOB to reschedule lithotripsy.  Healthcare Maintenance: - Medicare AWV - Urine albumin/Cr - ordered this visit - Covid vaccine - Shingrix vaccine - Tdap booster - Mammogram?*** - Colonoscopy - Diabetic eye exam - Diabetic foot exam  PERTINENT  PMH / PSH: ***  OBJECTIVE:   There were no vitals taken for this visit.  ***  ASSESSMENT/PLAN:   Assessment & Plan Type 2 diabetes mellitus without complication, without long-term current use of insulin  (HCC)      Alan Flies, MD [facility] "

## 2024-01-16 ENCOUNTER — Inpatient Hospital Stay: Attending: Hematology and Oncology

## 2024-01-16 ENCOUNTER — Inpatient Hospital Stay: Admitting: Hematology and Oncology

## 2024-01-16 ENCOUNTER — Ambulatory Visit: Payer: Self-pay | Admitting: Hematology and Oncology

## 2024-01-16 ENCOUNTER — Other Ambulatory Visit: Payer: Self-pay

## 2024-01-16 ENCOUNTER — Other Ambulatory Visit (HOSPITAL_COMMUNITY): Payer: Self-pay

## 2024-01-16 ENCOUNTER — Other Ambulatory Visit: Payer: Self-pay | Admitting: Pharmacist

## 2024-01-16 ENCOUNTER — Other Ambulatory Visit: Payer: Self-pay | Admitting: *Deleted

## 2024-01-16 ENCOUNTER — Telehealth: Payer: Self-pay

## 2024-01-16 ENCOUNTER — Other Ambulatory Visit: Payer: Self-pay | Admitting: Hematology and Oncology

## 2024-01-16 ENCOUNTER — Ambulatory Visit

## 2024-01-16 ENCOUNTER — Telehealth: Payer: Self-pay | Admitting: Pharmacist

## 2024-01-16 VITALS — BP 105/75 | HR 120 | Temp 97.5°F | Resp 17 | Wt 150.0 lb

## 2024-01-16 DIAGNOSIS — Z170 Estrogen receptor positive status [ER+]: Secondary | ICD-10-CM | POA: Insufficient documentation

## 2024-01-16 DIAGNOSIS — C50922 Malignant neoplasm of unspecified site of left male breast: Secondary | ICD-10-CM

## 2024-01-16 DIAGNOSIS — Z17411 Hormone receptor positive with human epidermal growth factor receptor 2 negative status: Secondary | ICD-10-CM | POA: Diagnosis not present

## 2024-01-16 DIAGNOSIS — C7951 Secondary malignant neoplasm of bone: Secondary | ICD-10-CM | POA: Insufficient documentation

## 2024-01-16 DIAGNOSIS — C50912 Malignant neoplasm of unspecified site of left female breast: Secondary | ICD-10-CM

## 2024-01-16 DIAGNOSIS — F17210 Nicotine dependence, cigarettes, uncomplicated: Secondary | ICD-10-CM | POA: Insufficient documentation

## 2024-01-16 DIAGNOSIS — Z7982 Long term (current) use of aspirin: Secondary | ICD-10-CM | POA: Diagnosis not present

## 2024-01-16 DIAGNOSIS — Z79899 Other long term (current) drug therapy: Secondary | ICD-10-CM | POA: Diagnosis not present

## 2024-01-16 DIAGNOSIS — Z1721 Progesterone receptor positive status: Secondary | ICD-10-CM | POA: Insufficient documentation

## 2024-01-16 DIAGNOSIS — Z79810 Long term (current) use of selective estrogen receptor modulators (SERMs): Secondary | ICD-10-CM | POA: Insufficient documentation

## 2024-01-16 DIAGNOSIS — E119 Type 2 diabetes mellitus without complications: Secondary | ICD-10-CM

## 2024-01-16 LAB — CBC WITH DIFFERENTIAL (CANCER CENTER ONLY)
Abs Immature Granulocytes: 0.01 K/uL (ref 0.00–0.07)
Basophils Absolute: 0 K/uL (ref 0.0–0.1)
Basophils Relative: 0 %
Eosinophils Absolute: 0 K/uL (ref 0.0–0.5)
Eosinophils Relative: 0 %
HCT: 28.9 % — ABNORMAL LOW (ref 39.0–52.0)
Hemoglobin: 9.1 g/dL — ABNORMAL LOW (ref 13.0–17.0)
Immature Granulocytes: 0 %
Lymphocytes Relative: 38 %
Lymphs Abs: 1.8 K/uL (ref 0.7–4.0)
MCH: 30.4 pg (ref 26.0–34.0)
MCHC: 31.5 g/dL (ref 30.0–36.0)
MCV: 96.7 fL (ref 80.0–100.0)
Monocytes Absolute: 0.4 K/uL (ref 0.1–1.0)
Monocytes Relative: 8 %
Neutro Abs: 2.5 K/uL (ref 1.7–7.7)
Neutrophils Relative %: 54 %
Platelet Count: 166 K/uL (ref 150–400)
RBC: 2.99 MIL/uL — ABNORMAL LOW (ref 4.22–5.81)
RDW: 18.9 % — ABNORMAL HIGH (ref 11.5–15.5)
WBC Count: 4.7 K/uL (ref 4.0–10.5)
nRBC: 0 % (ref 0.0–0.2)

## 2024-01-16 LAB — CMP (CANCER CENTER ONLY)
ALT: 20 U/L (ref 0–44)
AST: 67 U/L — ABNORMAL HIGH (ref 15–41)
Albumin: 4 g/dL (ref 3.5–5.0)
Alkaline Phosphatase: 348 U/L — ABNORMAL HIGH (ref 38–126)
Anion gap: 14 (ref 5–15)
BUN: 46 mg/dL — ABNORMAL HIGH (ref 8–23)
CO2: 24 mmol/L (ref 22–32)
Calcium: 10.3 mg/dL (ref 8.9–10.3)
Chloride: 101 mmol/L (ref 98–111)
Creatinine: 1.74 mg/dL — ABNORMAL HIGH (ref 0.61–1.24)
GFR, Estimated: 42 mL/min — ABNORMAL LOW
Glucose, Bld: 140 mg/dL — ABNORMAL HIGH (ref 70–99)
Potassium: 4.3 mmol/L (ref 3.5–5.1)
Sodium: 139 mmol/L (ref 135–145)
Total Bilirubin: 0.7 mg/dL (ref 0.0–1.2)
Total Protein: 9.3 g/dL — ABNORMAL HIGH (ref 6.5–8.1)

## 2024-01-16 MED ORDER — ALBUTEROL SULFATE HFA 108 (90 BASE) MCG/ACT IN AERS
2.0000 | INHALATION_SPRAY | Freq: Four times a day (QID) | RESPIRATORY_TRACT | 2 refills | Status: AC | PRN
  Filled 2024-01-16: qty 6.7, 17d supply, fill #0
  Filled 2024-01-31 (×2): qty 6.7, 17d supply, fill #1

## 2024-01-16 MED ORDER — PALBOCICLIB 100 MG PO TABS: 100.0000 mg | ORAL_TABLET | Freq: Every day | ORAL | 3 refills | Status: DC

## 2024-01-16 MED ORDER — PALBOCICLIB 100 MG PO CAPS: 100.0000 mg | ORAL_CAPSULE | Freq: Every day | ORAL | 3 refills | Status: DC

## 2024-01-16 NOTE — Progress Notes (Signed)
 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  Tamoxifen  tolerated. Palbociclib  initiation planned. Discussed with urology, they are ok with Ibrance . - Bone strengthener scheduled tomorrow. - Continued tamoxifen  therapy. - Prescribed palbociclib  at 100 mg 21days on one week off, plan to escalate if tolerated. - Coordinated with pharmacy and manufacturer for drug approval and potential free drug program. - Ordered imaging to assess for disease progression.  Cancer-related frailty and deconditioning Significant frailty and deconditioning due to poor oral intake and advanced malignancy. Dental issues contribute to weakness and dyspnea. Concern for further decline. - Encouraged improved oral intake following dental intervention. - Scheduled dental appointment for teeth implants to facilitate eating. - Provided anticipatory guidance regarding signs of further decline and end-of-life care. - Scheduled follow-up in one month to closely monitor status.  Possible chronic obstructive pulmonary disease History of tobacco use and current dyspnea. Normal oxygen saturation. No acute pulmonary pathology. COPD considered; empiric inhaler therapy initiated. - Prescribed albuterol  inhaler, two puffs as needed. - Advised to monitor response to inhaler and report persistent symptoms to primary care provider. - Provided guidance to seek emergency care if respiratory status worsens.  Gastritis or duodenitis Lower abdominal pain likely due to gastritis or duodenitis from poor oral intake and  gastric acid production. Pain mild, improves with water intake. - Recommended over-the-counter famotidine twice daily as needed for epigastric pain. - Encouraged improved oral intake to reduce gastric irritation.   HISTORY OF PRESENTING ILLNESS:  Larry Houston 69 y.o. male is here because of metastatic male breast cancer  Oncology History  Breast cancer metastasized to bone (HCC)  10/23/2023 Initial Diagnosis   Breast cancer metastasized to bone (HCC)   10/23/2023 Imaging   IMPRESSION: 1. No pulmonary embolism. 2. 8.3 cm left breast mass with overlying cutaneous thickening, suspicious for male breast cancer. 3. Left axillary, left subpectoral, and subcarinal nodal metastases. 4. Subpleural pulmonary nodularity, suspicious for metastases in this clinical setting. 5. Diffuse osseous metastases throughout the visualized axial and appendicular skeleton. 6. Trace bilateral pleural effusions.     10/25/2023 Pathology Results   BONE, LEFT ILIAC, BIOPSY:       Metastatic carcinoma.       See comment.  ADDENDUM:  Immunohistochemical stains were performed to characterize the tumor cells. The cells are strongly and diffusely positive for GATA3. The cells are negative for SOX10, NKX3.1, CDX2, PAX8, and TTF-1. The findings are in keeping with metastatic carcinoma of breast primary. Breast prognostic markers (ER, PR, Her2) will be performed and reported in an addendum.   IMMUNOHISTOCHEMICAL AND MORPHOMETRIC ANALYSIS PERFORMED MANUALLY   The tumor cells are negative for Her2 (0).   Estrogen Receptor:  90%, positive, moderate to strong staining intensity   Progesterone Receptor:  50%, positive, moderate to strong staining  intensity    11/09/2023 Cancer Staging   Staging form: Breast, AJCC 8th Edition - Clinical stage from 11/09/2023: Stage IV (cTX, cNX, cM1, GX, ER+, PR+, HER2-) - Signed by Loretha Ash, MD on 11/09/2023 Stage prefix: Initial diagnosis Histologic grading  system: 3 grade system    Discussed the use of AI scribe software for clinical  note transcription with the patient, who gave verbal consent to proceed.  History of Present Illness  Larry Houston is a 69 year old male with metastatic breast cancer to bone who presents for oncology follow-up due to worsening dyspnea, poor oral intake, and cancer-related frailty.  He has a history of metastatic breast cancer with osseous involvement and is currently taking tamoxifen . He is awaiting initiation of palbociclib  pending insurance and pharmacy coordination.  He reports progressive dyspnea, describing significant difficulty breathing and worsening chronic dysphonia. He has no prior use of inhalers and expresses concern about his respiratory status. He has a history of tobacco use but no diagnosis of COPD. He denies cough, fever, or chest pain.  He has poor oral intake due to edentulism, with dental implants scheduled for today. He has difficulty tolerating protein shakes and must force down medications, though he is able to take his breast cancer medication daily. He reports lower abdominal pain that improves with water intake and is attributed to hunger. He is able to swallow, but with difficulty.  He reports significant weight loss, generalized weakness, and low energy, and spends most of his time in bed.  All other systems were reviewed with the patient and are negative.  MEDICAL HISTORY:  Past Medical History:  Diagnosis Date   Alcohol  abuse    Arthritis    lumbar stenosis    Hypertension    Scrotal abscess    Stroke [facility])    Venous malformation 09/03/2016   Right lower extremity venous malformation    SURGICAL HISTORY: Past Surgical History:  Procedure Laterality Date   CYSTOSCOPY W/ URETERAL STENT PLACEMENT Right 10/24/2023   Procedure: CYSTOSCOPY, WITH RIGHT RETROGRADE PYELOGRAM;  Surgeon: Roseann Adine PARAS., MD;  Location: [facility] OR;  Service: Urology;  Laterality: Right;  POSSIBLE  LASER LIPOTRIPSY   EXTRACORPOREAL SHOCK WAVE LITHOTRIPSY Right 12/16/2023   Procedure: LITHOTRIPSY, ESWL;  Surgeon: Roseann Adine PARAS., MD;  Location: [facility] ORS;  Service: Urology;  Laterality: Right;   IR DIL URETER RIGHT  11/14/2023   IR NEPHROSTOMY EXCHANGE RIGHT  11/14/2023   IR NEPHROSTOMY PLACEMENT RIGHT  10/25/2023   IR RADIOLOGIST EVAL & MGMT  04/05/2016   IR RADIOLOGIST EVAL & MGMT  10/17/2016   IR RADIOLOGIST EVAL & MGMT  12/05/2016   IR SCLEROTHERAPY OF A FLUID COLLECTION Right 09/13/2016   Venous malformation [Q27.9]   RLE   IR SCLEROTHERAPY OF A FLUID COLLECTION  09/13/2016   IR URETERAL STENT PLACEMENT EXISTING ACCESS RIGHT  11/14/2023   IR US  GUIDE VASC ACCESS RIGHT  09/13/2016   LUMBAR LAMINECTOMY/DECOMPRESSION MICRODISCECTOMY Bilateral 01/26/2015   Procedure: Laminectomy and Foraminotomy - L4-L5 - bilateral;  Surgeon: Alm GORMAN Molt, MD;  Location: [facility] NEURO ORS;  Service: Neurosurgery;  Laterality: Bilateral;  Laminectomy and Foraminotomy - L4-L5 - bilateral   NASAL ENDOSCOPY WITH EPISTAXIS CONTROL N/A 04/04/2014   Procedure: NASAL ENDOSCOPY WITH EPISTAXIS CONTROL;  Surgeon: Merilee Kraft, MD;  Location: [facility] OR;  Service: ENT;  Laterality: N/A;   NECK SURGERY  1992   posterior fusion- cervical    RADIOLOGY WITH ANESTHESIA N/A 09/13/2016   Procedure: percutaneous sclerotherapy with dehydrated ethanol;  Surgeon: Karalee Beat, MD;  Location: [facility] OR;  Service: Radiology;  Laterality: N/A;   SCROTAL EXPLORATION N/A 10/24/2023   Procedure: EXPLORATION, SCROTUM;  Surgeon: Roseann Adine PARAS., MD;  Location: [facility] OR;  Service: Urology;  Laterality: N/A;  IRRIGATION AND DEBRIDEMENT SCROTUM ABSCESS    SOCIAL HISTORY: Social History   Socioeconomic History  Marital status: Divorced    Spouse name: Not on file   Number of children: Not on file   Years of education: Not on file   Highest education level: Not on file  Occupational History   Not on file  Tobacco Use   Smoking status:  Every Day    Current packs/day: 0.00    Types: Cigarettes    Start date: 01/14/1999    Last attempt to quit: 01/13/2006    Years since quitting: 18.0   Smokeless tobacco: Never  Vaping Use   Vaping status: Never Used  Substance and Sexual Activity   Alcohol  use: Yes    Comment: occasional    Drug use: No   Sexual activity: Not on file  Other Topics Concern   Not on file  Social History Narrative   Not on file   Social Drivers of Health   Tobacco Use: High Risk (12/16/2023)   Patient History    Smoking Tobacco Use: Every Day    Smokeless Tobacco Use: Never    Passive Exposure: Not on file  Financial Resource Strain: Not on file  Food Insecurity: No Food Insecurity (12/20/2023)   Epic    Worried About Programme Researcher, Broadcasting/film/video in the Last Year: Never true    Ran Out of Food in the Last Year: Never true  Transportation Needs: No Transportation Needs (12/20/2023)   Epic    Lack of Transportation (Medical): No    Lack of Transportation (Non-Medical): No  Physical Activity: Not on file  Stress: Not on file  Social Connections: Socially Isolated (10/23/2023)   Social Connection and Isolation Panel    Frequency of Communication with Friends and Family: Once a week    Frequency of Social Gatherings with Friends and Family: Never    Attends Religious Services: Never    Database Administrator or Organizations: Yes    Attends Banker Meetings: Never    Marital Status: Divorced  Catering Manager Violence: Not At Risk (10/23/2023)   Epic    Fear of Current or Ex-Partner: No    Emotionally Abused: No    Physically Abused: No    Sexually Abused: No  Depression (PHQ2-9): Low Risk (01/16/2024)   Depression (PHQ2-9)    PHQ-2 Score: 0  Alcohol  Screen: Not on file  Housing: Low Risk (12/20/2023)   Epic    Unable to Pay for Housing in the Last Year: No    Number of Times Moved in the Last Year: 0    Homeless in the Last Year: No  Recent Concern: Housing - High Risk (10/23/2023)    Epic    Unable to Pay for Housing in the Last Year: Yes    Number of Times Moved in the Last Year: 0    Homeless in the Last Year: No  Utilities: Not At Risk (12/20/2023)   Epic    Threatened with loss of utilities: No  Health Literacy: Not on file    FAMILY HISTORY: Family History  Problem Relation Age of Onset   Diabetes Mother    Hypertension Mother    Anemia Mother        Died of aplastic anemia   Lung disease Father    Heart attack Brother    Heart disease Sister        One sister had heart arrest    ALLERGIES:  is allergic to penicillins.  MEDICATIONS:  Current Outpatient Medications  Medication Sig Dispense Refill   acetaminophen  (TYLENOL )  325 MG tablet Take 2 tablets (650 mg total) by mouth every 6 (six) hours as needed for mild pain (pain score 1-3).     albuterol  (VENTOLIN  HFA) 108 (90 Base) MCG/ACT inhaler Inhale 2 puffs into the lungs every 6 (six) hours as needed for wheezing or shortness of breath. 8 g 2   amLODipine  (NORVASC ) 5 MG tablet Take 1 tablet (5 mg total) by mouth daily. 90 tablet 0   aspirin  EC 81 MG tablet Take 1 tablet (81 mg total) by mouth daily. Swallow whole. 90 tablet 3   atorvastatin  (LIPITOR ) 80 MG tablet Take 1 tablet (80 mg total) by mouth daily. 30 tablet 0   atorvastatin  (LIPITOR ) 80 MG tablet Take 1 tablet (80 mg total) by mouth every evening. 90 tablet 0   [Paused] clindamycin  (CLEOCIN  T) 1 % external solution Apply 1 Application topically 2 (two) times daily. 30 mL 0   feeding supplement (ENSURE PLUS HIGH PROTEIN) LIQD Take 237 mLs by mouth 2 (two) times daily between meals.     folic acid  (FOLVITE ) 1 MG tablet Take 1 tablet (1 mg total) by mouth daily.     gabapentin  (NEURONTIN ) 300 MG capsule Take 1 capsule (300 mg total) by mouth 2 (two) times daily. 60 capsule 0   Multiple Vitamin (MULTIVITAMIN WITH MINERALS) TABS tablet Take 1 tablet by mouth daily.     nicotine  (NICODERM CQ  - DOSED IN MG/24 HOURS) 14 mg/24hr patch Place 1 patch  (14 mg total) onto the skin daily. 28 patch 0   ondansetron  (ZOFRAN -ODT) 4 MG disintegrating tablet Take 1 tablet (4 mg total) by mouth every 8 (eight) hours as needed for nausea or vomiting. 15 tablet 0   palbociclib  (IBRANCE ) 100 MG capsule Take 1 capsule (100 mg total) by mouth daily with breakfast. Take whole with food. Take for 21 days on, 7 days off, repeat every 28 days. 21 capsule 3   polyethylene glycol (MIRALAX  / GLYCOLAX ) 17 g packet Take 17 g by mouth daily as needed.     senna (SENOKOT) 8.6 MG TABS tablet Take 1 tablet (8.6 mg total) by mouth daily as needed for mild constipation.     tamoxifen  (NOLVADEX ) 20 MG tablet Take 1 tablet (20 mg total) by mouth daily. 90 tablet 3   thiamine  (VITAMIN B-1) 100 MG tablet Take 1 tablet (100 mg total) by mouth daily.     No current facility-administered medications for this visit.     PHYSICAL EXAMINATION: ECOG PERFORMANCE STATUS: 2 - Symptomatic, <50% confined to bed  Vitals:   01/16/24 0904  BP: 105/75  Pulse: (!) 120  Resp: 17  Temp: (!) 97.5 F (36.4 C)  SpO2: 99%    Filed Weights   01/16/24 0904  Weight: 150 lb (68 kg)     GENERAL:alert, no distress and comfortable Left sided breast mass softer on palpation CTA bilaterally, diminished air entry bilaterally RRR No LE edema.  LABORATORY DATA:  I have reviewed the data as listed Lab Results  Component Value Date   WBC 4.7 01/16/2024   HGB 9.1 (L) 01/16/2024   HCT 28.9 (L) 01/16/2024   MCV 96.7 01/16/2024   PLT 166 01/16/2024     Chemistry      Component Value Date/Time   NA 139 01/16/2024 0822   K 4.3 01/16/2024 0822   CL 101 01/16/2024 0822   CO2 24 01/16/2024 0822   BUN 46 (H) 01/16/2024 0822   CREATININE 1.74 (H) 01/16/2024 9177  Component Value Date/Time   CALCIUM  10.3 01/16/2024 0822   ALKPHOS 348 (H) 01/16/2024 0822   AST 67 (H) 01/16/2024 0822   ALT 20 01/16/2024 0822   BILITOT 0.7 01/16/2024 9177       RADIOGRAPHIC STUDIES: I have  personally reviewed the radiological images as listed and agreed with the findings in the report. No results found.   All questions were answered. The patient knows to call the clinic with any problems, questions or concerns. I spent 40 minutes in the care of this patient including H and P, review of records, counseling and coordination of care.     Amber Stalls, MD 01/16/2024 10:03 AM

## 2024-01-16 NOTE — Telephone Encounter (Signed)
 Oral Oncology Patient Advocate Encounter   Received notification that prior authorization for Ibrance  TAB is required.   PA submitted on 01/16/2024 Key [id number] Status is pending      Charlott Hamilton,  CPhT-Adv  she/her/hers [facility]  [facility] Specialty Pharmacy Services Pharmacy Technician Patient Advocate Specialist III [facility] Phone: [phone]  Fax: [phone] [email or web address]

## 2024-01-16 NOTE — Telephone Encounter (Signed)
 [facility]        Telephone: [phone]?Fax: [phone]   Oncology Clinical Pharmacist Practitioner Encounter   Received new prescription for palbociclib  for the treatment of breast cancer. This is being given in combination with tamoxifen . It is planned to continue until disease progression or unacceptable toxicity.  Labs from 01/16/24 assessed. Prescription dose and frequency assessed. Patient was seen by Dr. Loretha today and labs reviewed.  Current medication list in Epic reviewed. Significant DDIs with palbociclib  identified:Yes. Dr. Loretha is aware of potential interaction with atorvastatin  with palbociclib .  Evaluated chart. Patient barriers to medication adherence identified: No.  Patient agreement for treatment documented in physician note on 01/16/24.  Prescription has been e-scribed to the [facility]) for benefits analysis and approval.  Dr. Loretha will see again in 3 weeks. Phone education will be done per her request.  Oral Oncology Clinic will continue to follow for insurance authorization, copayment issues, initial counseling and start date.  Schneider Warchol A. Lucila, PharmD, BCOP, CPP Hematology-Oncology Clinical Pharmacist Practitioner  01/16/2024 11:52 AM  **Disclaimer: This note was dictated with voice recognition software. Similar sounding words can inadvertently be transcribed and this note may contain transcription errors which may not have been corrected upon publication of note.**

## 2024-01-16 NOTE — Telephone Encounter (Signed)
-----   Message from Amber Stalls, MD sent at 01/16/2024 10:04 AM EST ----- His labs show dehydration, Can u ask him if he would like to come back later today or tomorrow for some IVF. He is not able to keep up with his oral intake.

## 2024-01-16 NOTE — Telephone Encounter (Signed)
 Oral Oncology Patient Advocate Encounter  Received notification that the request for prior authorization for Ibrance  Tab has been denied due to .      Charlott Hamilton,  CPhT-Adv  she/her/hers [facility]  [facility] Specialty Pharmacy Services Pharmacy Technician Patient Advocate Specialist III [facility] Phone: [phone]  Fax: [phone] [email or web address]

## 2024-01-16 NOTE — Progress Notes (Signed)
 Per MD request, RN placed orders under sign and held for 1 L NS over 2 hrs.

## 2024-01-17 ENCOUNTER — Other Ambulatory Visit (HOSPITAL_COMMUNITY): Payer: Self-pay

## 2024-01-17 ENCOUNTER — Inpatient Hospital Stay

## 2024-01-17 ENCOUNTER — Other Ambulatory Visit: Payer: Self-pay | Admitting: Pharmacist

## 2024-01-17 VITALS — BP 133/75 | HR 84 | Temp 98.1°F | Resp 18

## 2024-01-17 DIAGNOSIS — C50922 Malignant neoplasm of unspecified site of left male breast: Secondary | ICD-10-CM | POA: Diagnosis not present

## 2024-01-17 DIAGNOSIS — C50912 Malignant neoplasm of unspecified site of left female breast: Secondary | ICD-10-CM

## 2024-01-17 LAB — CANCER ANTIGEN 15-3: CA 15-3: 7080 U/mL — ABNORMAL HIGH (ref 0.0–25.0)

## 2024-01-17 LAB — CANCER ANTIGEN 27.29: CA 27.29: 10112 U/mL — ABNORMAL HIGH (ref 0.0–38.6)

## 2024-01-17 MED ORDER — ZOLEDRONIC ACID 4 MG/5ML IV CONC
3.0000 mg | Freq: Once | INTRAVENOUS | Status: AC
  Administered 2024-01-17: 3 mg via INTRAVENOUS
  Filled 2024-01-17: qty 3.75

## 2024-01-17 MED ORDER — SODIUM CHLORIDE 0.9 % IV SOLN: Freq: Once | INTRAVENOUS | Status: AC

## 2024-01-17 MED ORDER — SODIUM CHLORIDE 0.9 % IV SOLN: INTRAVENOUS | Status: DC

## 2024-01-17 NOTE — Patient Instructions (Signed)

## 2024-01-20 ENCOUNTER — Other Ambulatory Visit (HOSPITAL_COMMUNITY): Payer: Self-pay

## 2024-01-21 ENCOUNTER — Other Ambulatory Visit (HOSPITAL_COMMUNITY): Payer: Self-pay

## 2024-01-21 NOTE — Telephone Encounter (Signed)
 Oral Oncology Patient Advocate Encounter  Ibrance  Appeal not approved at this time   Charlott Hamilton,  CPhT-Adv  she/her/hers [facility]  [facility] Specialty Pharmacy Services Pharmacy Technician Patient Advocate Specialist III [facility] Phone: [phone]  Fax: [phone] [email or web address]

## 2024-01-22 NOTE — Progress Notes (Deleted)
" ° ° °  SUBJECTIVE:   CHIEF COMPLAINT / HPI:   Larry Houston is a 69 y.o. male presenting for follow-up of shortness of breath.  Shortness of Breath Previously scheduled to undergo lithotripsy with urology on 12/8; however, procedure was cancelled given increased shortness of breath.  He had shortness of breath at his OV with his oncologist on 1/8 and was prescribed an albuterol  inhaler (2 puffs prn).  Today, *** Hx tobacco use, COPD?  *** - Fu w urology for lithotripsy; reach out to Dr. Adine Manly with nephrology once cleared for his SOB to reschedule lithotripsy. - Needs repeat Echo (unable to schedule so far) - Get copy of Advanced Directive into chart - Discuss pain management - Fu with dentist for teeth implants  Healthcare Maintenance: - Medicare AWV - Urine albumin/Cr - ordered this visit - Covid vaccine - Shingrix vaccine - Tdap booster - Mammogram? - Colonoscopy - Diabetic eye exam - Diabetic foot exam  PERTINENT  PMH / PSH: Breast cancer with mets to bone, T2DM, Right ureteral calculus, hx CVA, alcohol  use  OBJECTIVE:   There were no vitals taken for this visit.  ***  ASSESSMENT/PLAN:   Assessment & Plan Type 2 diabetes mellitus without complication, without long-term current use of insulin  (HCC)      Alan Flies, MD [facility] "

## 2024-01-23 ENCOUNTER — Ambulatory Visit

## 2024-01-24 ENCOUNTER — Other Ambulatory Visit (HOSPITAL_COMMUNITY): Payer: Self-pay

## 2024-01-29 ENCOUNTER — Other Ambulatory Visit: Payer: Self-pay | Admitting: Pharmacist

## 2024-01-29 ENCOUNTER — Telehealth: Payer: Self-pay | Admitting: Hematology and Oncology

## 2024-01-29 ENCOUNTER — Other Ambulatory Visit (HOSPITAL_COMMUNITY): Payer: Self-pay

## 2024-01-29 ENCOUNTER — Other Ambulatory Visit: Payer: Self-pay | Admitting: Hematology and Oncology

## 2024-01-29 MED ORDER — ANASTROZOLE 1 MG PO TABS
1.0000 mg | ORAL_TABLET | Freq: Every day | ORAL | 3 refills | Status: AC
  Filled 2024-01-29: qty 90, 90d supply, fill #0

## 2024-01-29 NOTE — Telephone Encounter (Signed)
 Ibrance  appeal denied, Dr. Loretha changing therapy plan. I will send new PA once therapy plan has been updated.

## 2024-01-29 NOTE — Progress Notes (Signed)
 Treatment plan updated.

## 2024-01-29 NOTE — Telephone Encounter (Signed)
 I called the pt multiple times to inform denial of Ibrance , no answer. Called Mr Larry Houston his room mate who comes with him to all his appointments, listed as emergency contact Explained that insurance denied ibrance  with tamoxifen  despite documentation of evidence and appeals, hence we will have to switch his medication from tamoxifen  to anastrozole  with zoladex injections. He expressed understanding.

## 2024-01-30 ENCOUNTER — Telehealth: Payer: Self-pay | Admitting: Hematology and Oncology

## 2024-01-30 NOTE — Telephone Encounter (Signed)
 I left a voicemail for patient's emergency contact regarding upcoming appointments. I advised a return call if appointments conflict.

## 2024-01-30 NOTE — Telephone Encounter (Signed)
 I left voicemail for patient regarding scheduled monthly Zoladex injection appointments per staff message. I advised patient to return my call if dates/times do not work with his schedule.

## 2024-01-31 ENCOUNTER — Other Ambulatory Visit (HOSPITAL_COMMUNITY): Payer: Self-pay

## 2024-02-03 ENCOUNTER — Other Ambulatory Visit (HOSPITAL_COMMUNITY): Payer: Self-pay

## 2024-02-03 ENCOUNTER — Other Ambulatory Visit: Payer: Self-pay | Admitting: Hematology and Oncology

## 2024-02-03 MED ORDER — PALBOCICLIB 100 MG PO CAPS: 100.0000 mg | ORAL_CAPSULE | Freq: Every day | ORAL | 1 refills | Status: DC

## 2024-02-03 NOTE — Progress Notes (Signed)
 Ibrance  resent  with OFS and AI.  Larry Houston

## 2024-02-04 ENCOUNTER — Other Ambulatory Visit: Payer: Self-pay | Admitting: Pharmacist

## 2024-02-04 ENCOUNTER — Other Ambulatory Visit (HOSPITAL_COMMUNITY): Payer: Self-pay

## 2024-02-04 ENCOUNTER — Telehealth: Payer: Self-pay

## 2024-02-04 MED ORDER — PALBOCICLIB 100 MG PO TABS: 100.0000 mg | ORAL_TABLET | Freq: Every day | ORAL | 0 refills | Status: DC

## 2024-02-04 NOTE — Telephone Encounter (Signed)
 Oral Oncology Patient Advocate Encounter   Received notification that prior authorization  or appeal for Ibrance  TABLETS is required.   PA submitted on 02/04/2024 Prior Auth/Appeal has been called in By Norleen Parkinson (Pharmacist) Status is pending      Charlott Hamilton,  CPhT-Adv  she/her/hers [facility]  [facility] Specialty Pharmacy Services Pharmacy Technician Patient Advocate Specialist III [facility] Phone: [phone]  Fax: [phone] [email or web address]

## 2024-02-07 ENCOUNTER — Telehealth: Payer: Self-pay | Admitting: Pharmacist

## 2024-02-07 ENCOUNTER — Other Ambulatory Visit (HOSPITAL_COMMUNITY): Payer: Self-pay

## 2024-02-07 ENCOUNTER — Telehealth: Payer: Self-pay

## 2024-02-07 ENCOUNTER — Other Ambulatory Visit: Payer: Self-pay | Admitting: Pharmacist

## 2024-02-07 MED ORDER — ABEMACICLIB 50 MG PO TABS: 50.0000 mg | ORAL_TABLET | Freq: Two times a day (BID) | ORAL | 0 refills | Status: DC

## 2024-02-07 NOTE — Telephone Encounter (Signed)
 Oral Oncology Patient Advocate Encounter  Ibrance  Tab still not approved by [facility] medicare plan at this time   Approval still pending   Charlott Hamilton,  CPhT-Adv  she/her/hers [facility]  [facility] Specialty Pharmacy Services Pharmacy Technician Patient Advocate Specialist III [facility] Phone: [phone]  Fax: [phone] [email or web address]

## 2024-02-07 NOTE — Telephone Encounter (Signed)
 Oral Oncology Patient Advocate Encounter   Received notification that prior authorization for Verzenio  is required.   PA submitted on 02/07/2024 Key [id number] Status is pending      Charlott Hamilton,  CPhT-Adv  she/her/hers [facility]  [facility] Specialty Pharmacy Services Pharmacy Technician Patient Advocate Specialist III [facility] Phone: [phone]  Fax: [phone] [email or web address]

## 2024-02-07 NOTE — Telephone Encounter (Signed)
 [facility]        Telephone: [phone]?Fax: [phone]   Oncology Clinical Pharmacist Practitioner Encounter   Received new prescription for abemaciclib  for the treatment of breast cancer. This is being given in combination with anastrozole  and goserelin. It is planned to continue until disease progression or unacceptable toxicity.  Labs from 01/16/24 assessed. Prescription dose and frequency assessed. Dr. Loretha will titrate dose slowly and check baseline labs again prior to starting since several labs were abnormal at last visit.  Current medication list in Epic reviewed. Significant DDIs with abemaciclib  identified:No.  Evaluated chart. Patient barriers to medication adherence identified: No.  Patient agreement for treatment documented in physician note on 18/26. Initially was going to do palbociclib  with tamoxifen  but since it was denied, Dr. Loretha will try current treatment regimen.  Prescription has been e-scribed to the [facility]) for benefits analysis and approval.  Oral Oncology Clinic will continue to follow for insurance authorization, copayment issues, initial counseling and start date.  Ayumi Wangerin A. Lucila, PharmD, BCOP, CPP Hematology-Oncology Clinical Pharmacist Practitioner  02/07/2024 4:03 PM  **Disclaimer: This note was dictated with voice recognition software. Similar sounding words can inadvertently be transcribed and this note may contain transcription errors which may not have been corrected upon publication of note.**

## 2024-02-10 NOTE — Telephone Encounter (Signed)
 Oral Oncology Patient Advocate Encounter  Received notification that the request for prior authorization for Verzenio has been denied due to .      Charlott Hamilton,  CPhT-Adv  she/her/hers [facility]  [facility] Specialty Pharmacy Services Pharmacy Technician Patient Advocate Specialist III [facility] Phone: [phone]  Fax: [phone] [email or web address]

## 2024-02-11 ENCOUNTER — Telehealth: Payer: Self-pay | Admitting: Pharmacist

## 2024-02-11 ENCOUNTER — Telehealth: Payer: Self-pay

## 2024-02-11 ENCOUNTER — Other Ambulatory Visit (HOSPITAL_COMMUNITY): Payer: Self-pay

## 2024-02-11 ENCOUNTER — Other Ambulatory Visit: Payer: Self-pay | Admitting: Pharmacist

## 2024-02-11 ENCOUNTER — Encounter: Payer: Self-pay | Admitting: Hematology and Oncology

## 2024-02-11 DIAGNOSIS — C50912 Malignant neoplasm of unspecified site of left female breast: Secondary | ICD-10-CM

## 2024-02-11 MED ORDER — RIBOCICLIB SUCC (600 MG DOSE) 200 MG PO TBPK
600.0000 mg | ORAL_TABLET | Freq: Every day | ORAL | 0 refills | Status: AC
  Filled 2024-02-13: qty 63, 28d supply, fill #0

## 2024-02-11 NOTE — Telephone Encounter (Signed)
 Medication changed to Kisqali 

## 2024-02-11 NOTE — Telephone Encounter (Signed)
 Oral Oncology Patient Advocate Encounter  Was successful in securing patient a $7500 grant from [facility] to provide copayment coverage for Kisqali .  This will keep the out of pocket expense at $0.     [employer] ID: [id number]   The billing information is as follows and has been shared with [facility].    RxBin: [id number] PCN: [id number] Member ID: [id number] Group ID: [id number] Dates of Eligibility: 01/12/24 through 01/10/25  Fund:  Breast Cancer - Medicare Access   Charlott Hamilton,  CPhT-Adv  she/her/hers [facility]  [facility] Pharmacy Technician Patient Advocate Specialist III [facility] Phone: [phone]  Fax: [phone] [email or web address]

## 2024-02-12 ENCOUNTER — Inpatient Hospital Stay

## 2024-02-13 ENCOUNTER — Inpatient Hospital Stay

## 2024-02-13 ENCOUNTER — Inpatient Hospital Stay: Admitting: Hematology and Oncology

## 2024-02-13 ENCOUNTER — Inpatient Hospital Stay: Admitting: Pharmacist

## 2024-02-13 ENCOUNTER — Other Ambulatory Visit: Payer: Self-pay

## 2024-02-13 ENCOUNTER — Telehealth: Payer: Self-pay | Admitting: Hematology and Oncology

## 2024-02-13 ENCOUNTER — Other Ambulatory Visit (HOSPITAL_COMMUNITY): Payer: Self-pay

## 2024-02-13 ENCOUNTER — Inpatient Hospital Stay: Attending: Hematology and Oncology

## 2024-02-13 VITALS — BP 116/72 | HR 62 | Temp 97.7°F | Resp 17 | Wt 131.1 lb

## 2024-02-13 DIAGNOSIS — C50912 Malignant neoplasm of unspecified site of left female breast: Secondary | ICD-10-CM

## 2024-02-13 LAB — CBC WITH DIFFERENTIAL (CANCER CENTER ONLY)
Abs Immature Granulocytes: 0.01 10*3/uL (ref 0.00–0.07)
Basophils Absolute: 0 10*3/uL (ref 0.0–0.1)
Basophils Relative: 0 %
Eosinophils Absolute: 0 10*3/uL (ref 0.0–0.5)
Eosinophils Relative: 1 %
HCT: 28.3 % — ABNORMAL LOW (ref 39.0–52.0)
Hemoglobin: 9.3 g/dL — ABNORMAL LOW (ref 13.0–17.0)
Immature Granulocytes: 0 %
Lymphocytes Relative: 48 %
Lymphs Abs: 1.8 10*3/uL (ref 0.7–4.0)
MCH: 30.3 pg (ref 26.0–34.0)
MCHC: 32.9 g/dL (ref 30.0–36.0)
MCV: 92.2 fL (ref 80.0–100.0)
Monocytes Absolute: 0.4 10*3/uL (ref 0.1–1.0)
Monocytes Relative: 10 %
Neutro Abs: 1.6 10*3/uL — ABNORMAL LOW (ref 1.7–7.7)
Neutrophils Relative %: 41 %
Platelet Count: 155 10*3/uL (ref 150–400)
RBC: 3.07 MIL/uL — ABNORMAL LOW (ref 4.22–5.81)
RDW: 18.1 % — ABNORMAL HIGH (ref 11.5–15.5)
WBC Count: 3.7 10*3/uL — ABNORMAL LOW (ref 4.0–10.5)
nRBC: 0 % (ref 0.0–0.2)

## 2024-02-13 LAB — CMP (CANCER CENTER ONLY)
ALT: 12 U/L (ref 0–44)
AST: 46 U/L — ABNORMAL HIGH (ref 15–41)
Albumin: 3.9 g/dL (ref 3.5–5.0)
Alkaline Phosphatase: 381 U/L — ABNORMAL HIGH (ref 38–126)
Anion gap: 12 (ref 5–15)
BUN: 20 mg/dL (ref 8–23)
CO2: 25 mmol/L (ref 22–32)
Calcium: 9.3 mg/dL (ref 8.9–10.3)
Chloride: 98 mmol/L (ref 98–111)
Creatinine: 1.06 mg/dL (ref 0.61–1.24)
GFR, Estimated: >60 mL/min
Glucose, Bld: 109 mg/dL — ABNORMAL HIGH (ref 70–99)
Potassium: 4.3 mmol/L (ref 3.5–5.1)
Sodium: 134 mmol/L — ABNORMAL LOW (ref 135–145)
Total Bilirubin: 0.5 mg/dL (ref 0.0–1.2)
Total Protein: 8.4 g/dL — ABNORMAL HIGH (ref 6.5–8.1)

## 2024-02-13 MED ORDER — GOSERELIN ACETATE 3.6 MG ~~LOC~~ IMPL
3.6000 mg | DRUG_IMPLANT | Freq: Once | SUBCUTANEOUS | Status: AC
  Administered 2024-02-13: 3.6 mg via SUBCUTANEOUS
  Filled 2024-02-13: qty 3.6

## 2024-02-13 NOTE — Patient Instructions (Signed)
 [facility] [place] - A DEPT OF [place]  Thank you for choosing [facility] to provide your oncology/hematology care and for allowing us  to participate in your care today!  As a reminder, we spoke about the following today: ribociclib  for the treatment of breast cancer. It is planned to continue until disease progression or unacceptable toxicity.  Treatment goal: Control  Medication handout has been provided.   **For oral cancer medication prescription refill requests, contact your pharmacy and they will contact our office if needed. Allow 5-7 days for refills to be completed by your specialty pharmacy.    Cancer Center General Instructions:  If you have an appointment at the [facility], please go directly to the Cancer Center and check in at the registration area.  We strive to give you quality time with your provider. You may need to reschedule your appointment if you arrive late (15 or more minutes).  Arriving late affects you and other patients whose appointments are after yours.  Also, if you miss three or more appointments without notifying the office, you may be dismissed from the clinic at the provider's discretion.      BELOW ARE SYMPTOMS THAT SHOULD BE REPORTED IMMEDIATELY: *FEVER GREATER THAN 100.4 F (38 C) OR HIGHER *CHILLS OR SWEATING *NAUSEA AND VOMITING THAT IS NOT CONTROLLED WITH YOUR NAUSEA MEDICATION *UNUSUAL SHORTNESS OF BREATH *UNUSUAL BRUISING OR BLEEDING *URINARY PROBLEMS (pain or burning when urinating, or frequent urination) *BOWEL PROBLEMS (unusual diarrhea, constipation, pain near the anus) TENDERNESS IN MOUTH AND THROAT WITH OR WITHOUT PRESENCE OF ULCERS (sore throat, sores in mouth, or a toothache) UNUSUAL RASH, SWELLING OR PAIN  UNUSUAL VAGINAL DISCHARGE OR ITCHING   Items with * indicate a potential emergency and should be followed up as soon as possible or go to the Emergency Department if any problems  should occur.  Please show the CHEMOTHERAPY ALERT CARD at check-in to the Emergency Department and triage nurse.  Should you have questions after your visit or need to cancel or reschedule your appointment, please contact [facility] [place] - A DEPT OF [facility]  [phone] and follow the prompts.  Office hours are 8:00 a.m. to 4:30 p.m. Monday - Friday. Please note that voicemails left after 4:00 p.m. may not be returned until the following business day.  We are closed weekends and major holidays. You have access to a nurse at all times for urgent questions. Please call the main number to the clinic [phone] and follow the prompts.  For any non-urgent questions, you may also contact your provider using MyChart. We now offer e-Visits for anyone 45 and older to request care online for non-urgent symptoms. For details visit [email or web address].   Also download the MyChart app! Go to the app store, search MyChart, open the app, select [facility], and log in with your MyChart username and password.

## 2024-02-13 NOTE — Progress Notes (Signed)
 " [facility]        Telephone: [phone]?Fax: [phone]   Oncology Clinical Pharmacist Practitioner Initial Assessment   Larry Houston is a 69 y.o. male with a diagnosis of breast cancer. They were contacted today via telephone visit.  I connected with Larry Houston today by telephone and verified that I was speaking with the correct person using two patient identifiers. I discussed the limitations, risks, security and privacy concerns of performing an evaluation and management service by telemedicine and the availability of in-person appointments. The patient/caregiver expressed understanding and agreed to proceed.  Other persons participating in the visit and their role in the encounter: Larry Houston (emergency contact).   Patients location: home  Providers location: clinic  Indication/Regimen Ribociclib  (Kisqali ) is being used appropriately for treatment of metastatic breast cancer by Dr. Amber Stalls.      Wt Readings from Last 1 Encounters:  02/13/24 131 lb 1.6 oz (59.5 kg)    Estimated body surface area is 1.74 meters squared as calculated from the following:   Height as of 12/16/23: 6' (1.829 m).   Weight as of an earlier encounter on 02/13/24: 131 lb 1.6 oz (59.5 kg).  Baseline QTc Interval (Ribociclib  manufacturer guidelines use QTcF, [place] ECG reports QTcB) QTcB: 354 ms on 02/13/24 QTcF: 318 ms on 02/13/24  The dosing regimen is 3 tablets (600 mg total) by mouth daily on days 1 to 21 of a 28-day cycle. This is being given  in combination with anastorozole, goserelin, and zoledronic  acid. It is planned to continue until disease progression or unacceptable toxicity. Prescription dose and frequency assessed for appropriateness. The treatment goal is: Control.  Patient has agreed to treatment which is documented in physician note on 02/13/24. Counseled patient on administration, dosing, side effects, monitoring, drug-food interactions, safe handling,  storage, and disposal.  Patient will need to be seen again with second EKG on 03/12/24 since due for goserelin this day, and see Dr. Stalls. Will schedule.  Dose Modifications Full dose   Access Assessment Larry Houston will be receiving ribociclib  through [facility] Concerns: none Start date if known: 02/18/24 (delivery 02/17/24)  Adherence Assessment Reviewed importance on keeping a med schedule and plan for any missed doses Barriers to adherence identified? No  Communication and Learning Assessment Primary learner: Patient, good friend Larry Houston was also at visit Barriers to learning: No barriers Preferred language: English Learning preferences: Listening   Allergies Allergies[1]  Vitals    02/13/2024   10:43 AM 01/17/2024   11:57 AM 01/17/2024    9:30 AM  Oncology Vitals  Weight 59.467 kg    Weight (lbs) 131 lbs 2 oz    BMI 17.78 kg/m2    Temp 97.7 F (36.5 C)  98.1 F (36.7 C)  Pulse Rate 62 84 108  BP 116/72 133/75 120/83  Resp 17 18 16   SpO2 96 % 100 % 100 %  BSA (m2) 1.74 m2       Laboratory Data    Latest Ref Rng & Units 02/13/2024    8:59 AM 01/16/2024    8:22 AM 12/19/2023    8:45 AM  CBC EXTENDED  WBC 4.0 - 10.5 K/uL 3.7  4.7  7.4   RBC 4.22 - 5.81 MIL/uL 3.07  2.99  2.44   Hemoglobin 13.0 - 17.0 g/dL 9.3  9.1  7.6   HCT 60.9 - 52.0 % 28.3  28.9  24.4   Platelets 150 -  400 K/uL 155  166  182   NEUT# 1.7 - 7.7 K/uL 1.6  2.5  4.3   Lymph# 0.7 - 4.0 K/uL 1.8  1.8  2.4        Latest Ref Rng & Units 02/13/2024    8:59 AM 01/16/2024    8:22 AM 12/19/2023    8:45 AM  CMP  Glucose 70 - 99 mg/dL 890  859  850   BUN 8 - 23 mg/dL 20  46  17   Creatinine 0.61 - 1.24 mg/dL 8.93  8.25  8.67   Sodium 135 - 145 mmol/L 134  139  139   Potassium 3.5 - 5.1 mmol/L 4.3  4.3  4.7   Chloride 98 - 111 mmol/L 98  101  104   CO2 22 - 32 mmol/L 25  24  24    Calcium  8.9 - 10.3 mg/dL 9.3  89.6  9.5   Total Protein 6.5 - 8.1 g/dL 8.4  9.3  8.5   Total  Bilirubin 0.0 - 1.2 mg/dL 0.5  0.7  0.5   Alkaline Phos 38 - 126 U/L 381  348  466   AST 15 - 41 U/L 46  67  66   ALT 0 - 44 U/L 12  20  10     Lab Results  Component Value Date   MG 2.1 10/27/2023   Lab Results  Component Value Date   CA2729 10,112.0 (H) 01/16/2024    Contraindications Contraindications were reviewed? Yes Contraindications to therapy were identified? No   Safety Precautions The following safety precautions for the use of ribociclib  were reviewed:  Fever: reviewed the importance of having a thermometer and the Centers for Disease Control and Prevention (CDC) definition of fever which is 100.26F (38C) or higher. Patient should call 24/7 triage at [phone] if experiencing a fever or any other symptoms ILD / Pneumonitis Severe Cutaneous Reactions QTc prolongation Hepatobiliary toxicity Neutropenia Febrile Neutropenia Embryo-Fetal toxicity Lactation Infertility Avoid grapefruit products Avoid pomegranate products Nausea Vomiting Diarrhea Stomatitis Fatigue Alopecia  Medication Reconciliation Current Outpatient Medications  Medication Sig Dispense Refill   acetaminophen  (TYLENOL ) 325 MG tablet Take 2 tablets (650 mg total) by mouth every 6 (six) hours as needed for mild pain (pain score 1-3).     albuterol  (VENTOLIN  HFA) 108 (90 Base) MCG/ACT inhaler Inhale 2 puffs into the lungs every 6 (six) hours as needed for wheezing or shortness of breath. 6.7 g 2   amLODipine  (NORVASC ) 5 MG tablet Take 1 tablet (5 mg total) by mouth daily. 90 tablet 0   anastrozole  (ARIMIDEX ) 1 MG tablet Take 1 tablet (1 mg total) by mouth daily. 90 tablet 3   aspirin  EC 81 MG tablet Take 1 tablet (81 mg total) by mouth daily. Swallow whole. 90 tablet 3   atorvastatin  (LIPITOR ) 80 MG tablet Take 1 tablet (80 mg total) by mouth daily. 30 tablet 0   atorvastatin  (LIPITOR ) 80 MG tablet Take 1 tablet (80 mg total) by mouth every evening. 90 tablet 0   [Paused] clindamycin  (CLEOCIN   T) 1 % external solution Apply 1 Application topically 2 (two) times daily. 30 mL 0   feeding supplement (ENSURE PLUS HIGH PROTEIN) LIQD Take 237 mLs by mouth 2 (two) times daily between meals.     folic acid  (FOLVITE ) 1 MG tablet Take 1 tablet (1 mg total) by mouth daily.     gabapentin  (NEURONTIN ) 300 MG capsule Take 1 capsule (300 mg total) by mouth 2 (two)  times daily. 60 capsule 0   Multiple Vitamin (MULTIVITAMIN WITH MINERALS) TABS tablet Take 1 tablet by mouth daily.     nicotine  (NICODERM CQ  - DOSED IN MG/24 HOURS) 14 mg/24hr patch Place 1 patch (14 mg total) onto the skin daily. 28 patch 0   ondansetron  (ZOFRAN -ODT) 4 MG disintegrating tablet Take 1 tablet (4 mg total) by mouth every 8 (eight) hours as needed for nausea or vomiting. 15 tablet 0   polyethylene glycol (MIRALAX  / GLYCOLAX ) 17 g packet Take 17 g by mouth daily as needed.     ribociclib  succ (KISQALI  600MG  DAILY DOSE) 200 MG Therapy Pack Take 3 tablets (600 mg total) by mouth daily. Take for 21 days on, 7 days off, repeat every 28 days. 63 tablet 0   senna (SENOKOT) 8.6 MG TABS tablet Take 1 tablet (8.6 mg total) by mouth daily as needed for mild constipation.     thiamine  (VITAMIN B-1) 100 MG tablet Take 1 tablet (100 mg total) by mouth daily.     No current facility-administered medications for this visit.   Medication reconciliation is based on the patient's most recent medication list in the electronic medical record (EMR) including herbal products and OTC medications.   The patient's medication list was reviewed today with the patient? Yes   Drug-drug interactions (DDIs) DDIs were evaluated? Yes Significant DDIs identified? No   Drug-Food Interactions Drug-food interactions were evaluated? Yes Drug-food interactions identified? Yes, avoid pomegranate and grapefruit products  Follow-up Plan  Patient education handout given to patient Start ribociclib  3 tablets (600 mg total) by mouth daily. Take for 21 days,  followed by a 7-day rest period (28-day supply) Continue anastrozole  1 mg by mouth daily Continue goserelin 3.6 mg SubQ, last given 02/13/24, next due 03/12/24 Continue zoledronic  acid, last given 01/16/24, next due 04/10/24 Monitor for side effects Will add lab , EKG, and Dr. Loretha visit to 03/12/24 visit for goserelin. Distress thermometer not completed as patient has had prior treatment at a [facility].  Larry Houston can follow up with clinical pharmacy as deemed necessary by Dr. Amber Iruku going forward   Larry Houston participated in the discussion, expressed understanding, and voiced agreement with the above plan. All questions were answered to their satisfaction. The patient was advised to contact the clinic at [phone] with any questions or concerns prior to their return visit.   I spent 30 minutes assessing the patient.  Nelissa Bolduc A. Houston, PharmD, BCOP, CPP  Larry Houston, RPH-CPP, 02/13/2024 12:10 PM  **Disclaimer: This note was dictated with voice recognition software. Similar sounding words can inadvertently be transcribed and this note may contain transcription errors which may not have been corrected upon publication of note.**      [1]  Allergies Allergen Reactions   Penicillins Hives    Has patient had a PCN reaction causing immediate rash, facial/tongue/throat swelling, SOB or lightheadedness with hypotension: No Has patient had a PCN reaction causing severe rash involving mucus membranes or skin necrosis: No Has patient had a PCN reaction that required hospitalization No Has patient had a PCN reaction occurring within the last 10 years: No If all of the above answers are NO, then may proceed with Cephalosporin use.   "

## 2024-02-13 NOTE — Progress Notes (Signed)
 Specialty Pharmacy Initial Fill Coordination Note  Larry Houston is a 69 y.o. male contacted today regarding refills of specialty medication(s) Ribociclib  Succinate (KISQALI  600mg  Daily Dose) .  Patient requested Delivery  on 02/17/24  to verified address [address] [facility] [place] [address]   Medication will be filled on 02/14/2024.   Patient is aware of $0 copayment. Use grant On File

## 2024-02-13 NOTE — Progress Notes (Signed)
 Patient counseled in clinic telephone visit note on 02/13/24

## 2024-02-13 NOTE — Progress Notes (Signed)
 Okay to proceed with Zoladex  today while shara is pending per Darlena.  Harlene Nasuti, PharmD Oncology Infusion Pharmacist 02/13/2024 10:04 AM

## 2024-02-13 NOTE — Telephone Encounter (Signed)
 Call pt to update about added appts 3/5.. no answer/ vm full.. mailed out appt reminder

## 2024-02-13 NOTE — Progress Notes (Signed)
 Larry Houston  Patient Care Team: Larry Palma, MD as PCP - General (Family Medicine) Larry Candyce RAMAN, MD as PCP - Cardiology (Cardiology) Larry Wilkie POUR, MD as Consulting Physician (Interventional Radiology)  CHIEF COMPLAINTS/PURPOSE OF CONSULTATION:  Metastatic male breast cancer  ASSESSMENT & PLAN:  Assessment & Plan  Metastatic breast cancer with bone metastases Chronic, advanced disease with persistent tumor burden in axilla and chest wall. Functional status improved with increased mobility. Ribociclib  approved after insurance delays. Disease symptomatic without significant progression. Bone metastases managed with bone-strengthening agent. No acute respiratory symptoms. - Initiated ribociclib  oral therapy, two tablets daily; discussed efficacy and low incidence of gastrointestinal side effects. - Administered monthly injection zoladex  as part of combination therapy with ribociclib  and anastrozole . - Continued bone-strengthening agent every twelve weeks; confirmed last dose received. - Ordered baseline EKG prior to ribociclib  initiation; planned repeat EKG at one and two months post-initiation. - Postponed imaging since he was unable to get the CDK4/6 inhibitor. - Provided anticipatory guidance regarding possible vasomotor symptoms following injection.  Severe protein-calorie malnutrition Ongoing poor oral intake with stable weight. Nutritional status may be slightly improved. Risk persists due to limited intake and prior weight loss. - Encouraged increased oral intake and consumption of tolerated foods.  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 Recent labs stable without significant worsening. Continue to monitor.  HISTORY OF PRESENTING ILLNESS:  Larry Houston 69 y.o. male is here because of metastatic male breast cancer  Oncology History  Breast cancer metastasized to bone (HCC)  10/23/2023 Initial  Diagnosis   Breast cancer metastasized to bone (HCC)   10/23/2023 Imaging   IMPRESSION: 1. No pulmonary embolism. 2. 8.3 cm left breast mass with overlying cutaneous thickening, suspicious for male breast cancer. 3. Left axillary, left subpectoral, and subcarinal nodal metastases. 4. Subpleural pulmonary nodularity, suspicious for metastases in this clinical setting. 5. Diffuse osseous metastases throughout the visualized axial and appendicular skeleton. 6. Trace bilateral pleural effusions.     10/25/2023 Pathology Results   BONE, LEFT ILIAC, BIOPSY:       Metastatic carcinoma.       See comment.  ADDENDUM:  Immunohistochemical stains were performed to characterize the tumor cells. The cells are strongly and diffusely positive for GATA3. The cells are negative for SOX10, NKX3.1, CDX2, PAX8, and TTF-1. The findings are in keeping with metastatic carcinoma of breast primary. Breast prognostic markers (ER, PR, Her2) will be performed and reported in an addendum.   IMMUNOHISTOCHEMICAL AND MORPHOMETRIC ANALYSIS PERFORMED MANUALLY   The tumor cells are negative for Her2 (0).   Estrogen Receptor:  90%, positive, moderate to strong staining intensity   Progesterone Receptor:  50%, positive, moderate to strong staining  intensity    11/09/2023 Cancer Staging   Staging form: Breast, AJCC 8th Edition - Clinical stage from 11/09/2023: Stage IV (cTX, cNX, cM1, GX, ER+, PR+, HER2-) - Signed by Larry Ash, MD on 11/09/2023 Stage prefix: Initial diagnosis Histologic grading system: 3 grade system    Discussed the use of AI scribe software for clinical Houston transcription with the patient, who gave verbal consent to proceed.  History of Present Illness  Larry Houston is a 69 year old male with metastatic breast cancer with bone metastases, severe protein-calorie malnutrition, and anemia in neoplastic disease who presents for oncology follow-up to initiate ribociclib  after  insurance delays.  He has advanced breast cancer with extensive tumor burden involving the axilla, chest wall, and bone metastases. Initiation of  oral chemotherapy was delayed for two weeks due to insurance denials of multiple regimens (Ibrance  with tamoxifen , Ibrance  with anastrozole , Verzenio ), with approval ultimately obtained for ribociclib . He is scheduled to begin ribociclib  in combination with a monthly injection and an additional oral agent. Baseline and follow-up EKGs are required due to risk of QT interval prolongation. He has received one dose of a bone-strengthening agent, administered every twelve weeks.  He continues to experience persistent pruritus and minor bleeding in the axillary region, attributed to scratching of the tumor. The axillary mass has been present and progressively enlarging over approximately eight years. He denies new gastrointestinal symptoms, and ribociclib  is not expected to worsen his baseline nausea or food intolerance.  He reports slow functional improvement, now able to ambulate within the kitchen and descend stairs independently, but remains unable to ascend stairs and requires a wheelchair for longer distances. Oral intake remains limited to small amounts of oatmeal and yogurt, with persistent significant weight loss but no substantial change since the last visit. His thighs appear slightly thicker. He receives assistance with activities of daily living from his roommate, though he expresses frustration with encouragement to increase oral intake.  He previously experienced dyspnea and was prescribed an inhaler, but currently denies increased respiratory symptoms.  He has not yet completed the oral chemotherapy education class, and communication with the pharmacy regarding medication access and education is ongoing.  All other systems were reviewed with the patient and are negative.  MEDICAL HISTORY:  Past Medical History:  Diagnosis Date   Alcohol  abuse     Arthritis    lumbar stenosis    Hypertension    Scrotal abscess    Stroke [facility])    Venous malformation 09/03/2016   Right lower extremity venous malformation    SURGICAL HISTORY: Past Surgical History:  Procedure Laterality Date   CYSTOSCOPY W/ URETERAL STENT PLACEMENT Right 10/24/2023   Procedure: CYSTOSCOPY, WITH RIGHT RETROGRADE PYELOGRAM;  Surgeon: Roseann Adine PARAS., MD;  Location: [facility] OR;  Service: Urology;  Laterality: Right;  POSSIBLE LASER LIPOTRIPSY   EXTRACORPOREAL SHOCK WAVE LITHOTRIPSY Right 12/16/2023   Procedure: LITHOTRIPSY, ESWL;  Surgeon: Roseann Adine PARAS., MD;  Location: [facility] ORS;  Service: Urology;  Laterality: Right;   IR DIL URETER RIGHT  11/14/2023   IR NEPHROSTOMY EXCHANGE RIGHT  11/14/2023   IR NEPHROSTOMY PLACEMENT RIGHT  10/25/2023   IR RADIOLOGIST EVAL & MGMT  04/05/2016   IR RADIOLOGIST EVAL & MGMT  10/17/2016   IR RADIOLOGIST EVAL & MGMT  12/05/2016   IR SCLEROTHERAPY OF A FLUID COLLECTION Right 09/13/2016   Venous malformation [Q27.9]   RLE   IR SCLEROTHERAPY OF A FLUID COLLECTION  09/13/2016   IR URETERAL STENT PLACEMENT EXISTING ACCESS RIGHT  11/14/2023   IR US  GUIDE VASC ACCESS RIGHT  09/13/2016   LUMBAR LAMINECTOMY/DECOMPRESSION MICRODISCECTOMY Bilateral 01/26/2015   Procedure: Laminectomy and Foraminotomy - L4-L5 - bilateral;  Surgeon: Alm GORMAN Molt, MD;  Location: [facility] NEURO ORS;  Service: Neurosurgery;  Laterality: Bilateral;  Laminectomy and Foraminotomy - L4-L5 - bilateral   NASAL ENDOSCOPY WITH EPISTAXIS CONTROL N/A 04/04/2014   Procedure: NASAL ENDOSCOPY WITH EPISTAXIS CONTROL;  Surgeon: Merilee Kraft, MD;  Location: [facility] OR;  Service: ENT;  Laterality: N/A;   NECK SURGERY  1992   posterior fusion- cervical    RADIOLOGY WITH ANESTHESIA N/A 09/13/2016   Procedure: percutaneous sclerotherapy with dehydrated ethanol;  Surgeon: Larry Beat, MD;  Location: [facility] OR;  Service: Radiology;  Laterality: N/A;   SCROTAL  EXPLORATION N/A 10/24/2023   Procedure:  EXPLORATION, SCROTUM;  Surgeon: Roseann Adine PARAS., MD;  Location: [facility] OR;  Service: Urology;  Laterality: N/A;  IRRIGATION AND DEBRIDEMENT SCROTUM ABSCESS    SOCIAL HISTORY: Social History   Socioeconomic History   Marital status: Divorced    Spouse name: Not on file   Number of children: Not on file   Years of education: Not on file   Highest education level: Not on file  Occupational History   Not on file  Tobacco Use   Smoking status: Every Day    Current packs/day: 0.00    Types: Cigarettes    Start date: 01/14/1999    Last attempt to quit: 01/13/2006    Years since quitting: 18.0   Smokeless tobacco: Never  Vaping Use   Vaping status: Never Used  Substance and Sexual Activity   Alcohol  use: Yes    Comment: occasional    Drug use: No   Sexual activity: Not on file  Other Topics Concern   Not on file  Social History Narrative   Not on file   Social Drivers of Health   Tobacco Use: High Risk (12/16/2023)   Patient History    Smoking Tobacco Use: Every Day    Smokeless Tobacco Use: Never    Passive Exposure: Not on file  Financial Resource Strain: Not on file  Food Insecurity: No Food Insecurity (12/20/2023)   Epic    Worried About Programme Researcher, Broadcasting/film/video in the Last Year: Never true    Ran Out of Food in the Last Year: Never true  Transportation Needs: No Transportation Needs (12/20/2023)   Epic    Lack of Transportation (Medical): No    Lack of Transportation (Non-Medical): No  Physical Activity: Not on file  Stress: Not on file  Social Connections: Socially Isolated (10/23/2023)   Social Connection and Isolation Panel    Frequency of Communication with Friends and Family: Once a week    Frequency of Social Gatherings with Friends and Family: Never    Attends Religious Services: Never    Database Administrator or Organizations: Yes    Attends Banker Meetings: Never    Marital Status: Divorced  Catering Manager Violence: Not At Risk (10/23/2023)    Epic    Fear of Current or Ex-Partner: No    Emotionally Abused: No    Physically Abused: No    Sexually Abused: No  Depression (PHQ2-9): Low Risk (01/17/2024)   Depression (PHQ2-9)    PHQ-2 Score: 0  Alcohol  Screen: Not on file  Housing: Low Risk (12/20/2023)   Epic    Unable to Pay for Housing in the Last Year: No    Number of Times Moved in the Last Year: 0    Homeless in the Last Year: No  Recent Concern: Housing - High Risk (10/23/2023)   Epic    Unable to Pay for Housing in the Last Year: Yes    Number of Times Moved in the Last Year: 0    Homeless in the Last Year: No  Utilities: Not At Risk (12/20/2023)   Epic    Threatened with loss of utilities: No  Health Literacy: Not on file    FAMILY HISTORY: Family History  Problem Relation Age of Onset   Diabetes Mother    Hypertension Mother    Anemia Mother        Died of aplastic anemia   Lung disease Father    Heart attack  Brother    Heart disease Sister        One sister had heart arrest    ALLERGIES:  is allergic to penicillins.  MEDICATIONS:  Current Outpatient Medications  Medication Sig Dispense Refill   acetaminophen  (TYLENOL ) 325 MG tablet Take 2 tablets (650 mg total) by mouth every 6 (six) hours as needed for mild pain (pain score 1-3).     albuterol  (VENTOLIN  HFA) 108 (90 Base) MCG/ACT inhaler Inhale 2 puffs into the lungs every 6 (six) hours as needed for wheezing or shortness of breath. 6.7 g 2   amLODipine  (NORVASC ) 5 MG tablet Take 1 tablet (5 mg total) by mouth daily. 90 tablet 0   anastrozole  (ARIMIDEX ) 1 MG tablet Take 1 tablet (1 mg total) by mouth daily. 90 tablet 3   aspirin  EC 81 MG tablet Take 1 tablet (81 mg total) by mouth daily. Swallow whole. 90 tablet 3   atorvastatin  (LIPITOR ) 80 MG tablet Take 1 tablet (80 mg total) by mouth daily. 30 tablet 0   atorvastatin  (LIPITOR ) 80 MG tablet Take 1 tablet (80 mg total) by mouth every evening. 90 tablet 0   [Paused] clindamycin  (CLEOCIN  T) 1 %  external solution Apply 1 Application topically 2 (two) times daily. 30 mL 0   feeding supplement (ENSURE PLUS HIGH PROTEIN) LIQD Take 237 mLs by mouth 2 (two) times daily between meals.     folic acid  (FOLVITE ) 1 MG tablet Take 1 tablet (1 mg total) by mouth daily.     gabapentin  (NEURONTIN ) 300 MG capsule Take 1 capsule (300 mg total) by mouth 2 (two) times daily. 60 capsule 0   Multiple Vitamin (MULTIVITAMIN WITH MINERALS) TABS tablet Take 1 tablet by mouth daily.     nicotine  (NICODERM CQ  - DOSED IN MG/24 HOURS) 14 mg/24hr patch Place 1 patch (14 mg total) onto the skin daily. 28 patch 0   ondansetron  (ZOFRAN -ODT) 4 MG disintegrating tablet Take 1 tablet (4 mg total) by mouth every 8 (eight) hours as needed for nausea or vomiting. 15 tablet 0   polyethylene glycol (MIRALAX  / GLYCOLAX ) 17 g packet Take 17 g by mouth daily as needed.     ribociclib  succ (KISQALI  600MG  DAILY DOSE) 200 MG Therapy Pack Take 3 tablets (600 mg total) by mouth daily. Take for 21 days on, 7 days off, repeat every 28 days. 63 tablet 0   senna (SENOKOT) 8.6 MG TABS tablet Take 1 tablet (8.6 mg total) by mouth daily as needed for mild constipation.     thiamine  (VITAMIN B-1) 100 MG tablet Take 1 tablet (100 mg total) by mouth daily.     No current facility-administered medications for this visit.   Facility-Administered Medications Ordered in Other Visits  Medication Dose Route Frequency Provider Last Rate Last Admin   goserelin (ZOLADEX ) injection 3.6 mg  3.6 mg Subcutaneous Once Huston Stonehocker, MD         PHYSICAL EXAMINATION: ECOG PERFORMANCE STATUS: 2 - Symptomatic, <50% confined to bed  There were no vitals filed for this visit.   There were no vitals filed for this visit.    GENERAL:alert, no distress and comfortable Left sided breast mass with axillary adenopathy, very locally advanced disease. CTA bilaterally, diminished air entry bilaterally RRR No LE edema.  LABORATORY DATA:  I have reviewed  the data as listed Lab Results  Component Value Date   WBC 3.7 (L) 02/13/2024   HGB 9.3 (L) 02/13/2024   HCT 28.3 (L) 02/13/2024  MCV 92.2 02/13/2024   PLT 155 02/13/2024     Chemistry      Component Value Date/Time   NA 134 (L) 02/13/2024 0859   K 4.3 02/13/2024 0859   CL 98 02/13/2024 0859   CO2 25 02/13/2024 0859   BUN 20 02/13/2024 0859   CREATININE 1.06 02/13/2024 0859      Component Value Date/Time   CALCIUM  9.3 02/13/2024 0859   ALKPHOS 381 (H) 02/13/2024 0859   AST 46 (H) 02/13/2024 0859   ALT 12 02/13/2024 0859   BILITOT 0.5 02/13/2024 0859       RADIOGRAPHIC STUDIES: I have personally reviewed the radiological images as listed and agreed with the findings in the report. No results found.   All questions were answered. The patient knows to call the clinic with any problems, questions or concerns. I spent 40 minutes in the care of this patient including H and P, review of records, counseling and coordination of care.     Amber Stalls, MD 02/13/2024 10:11 AM

## 2024-02-14 ENCOUNTER — Other Ambulatory Visit: Payer: Self-pay

## 2024-02-14 LAB — CANCER ANTIGEN 15-3: CA 15-3: 6890 U/mL — ABNORMAL HIGH (ref 0.0–25.0)

## 2024-02-17 ENCOUNTER — Ambulatory Visit (HOSPITAL_COMMUNITY)

## 2024-03-12 ENCOUNTER — Inpatient Hospital Stay: Attending: Hematology and Oncology

## 2024-03-12 ENCOUNTER — Inpatient Hospital Stay: Admitting: Hematology and Oncology

## 2024-03-12 ENCOUNTER — Inpatient Hospital Stay

## 2024-03-24 ENCOUNTER — Inpatient Hospital Stay: Admitting: Hematology and Oncology

## 2024-03-24 ENCOUNTER — Inpatient Hospital Stay

## 2024-04-10 ENCOUNTER — Inpatient Hospital Stay: Attending: Hematology and Oncology

## 2024-04-16 ENCOUNTER — Inpatient Hospital Stay

## 2024-05-14 ENCOUNTER — Inpatient Hospital Stay: Attending: Hematology and Oncology

## 2024-06-18 ENCOUNTER — Inpatient Hospital Stay: Attending: Hematology and Oncology

## 2024-07-16 ENCOUNTER — Inpatient Hospital Stay: Attending: Hematology and Oncology

## 2024-08-20 ENCOUNTER — Inpatient Hospital Stay: Attending: Hematology and Oncology

## 2024-09-17 ENCOUNTER — Inpatient Hospital Stay: Attending: Hematology and Oncology

## 2024-10-15 ENCOUNTER — Inpatient Hospital Stay: Attending: Hematology and Oncology

## 2024-11-12 ENCOUNTER — Inpatient Hospital Stay: Attending: Hematology and Oncology
